# Patient Record
Sex: Female | Born: 1950
Health system: Southern US, Community
[De-identification: ages and names within clinical notes are randomized; demographics above are authoritative.]

## PROBLEM LIST (undated history)

## (undated) DIAGNOSIS — M1A9XX0 Chronic gout, unspecified, without tophus (tophi): Secondary | ICD-10-CM

## (undated) DIAGNOSIS — M1A00X0 Idiopathic chronic gout, unspecified site, without tophus (tophi): Secondary | ICD-10-CM

## (undated) DIAGNOSIS — I10 Essential (primary) hypertension: Secondary | ICD-10-CM

## (undated) DIAGNOSIS — E079 Disorder of thyroid, unspecified: Secondary | ICD-10-CM

## (undated) DIAGNOSIS — E119 Type 2 diabetes mellitus without complications: Secondary | ICD-10-CM

## (undated) DIAGNOSIS — E785 Hyperlipidemia, unspecified: Secondary | ICD-10-CM

## (undated) DIAGNOSIS — C3490 Malignant neoplasm of unspecified part of unspecified bronchus or lung: Secondary | ICD-10-CM

## (undated) HISTORY — DX: Malignant neoplasm of unspecified part of unspecified bronchus or lung: C34.90

## (undated) HISTORY — DX: Chronic gout, unspecified, without tophus (tophi): M1A.9XX0

## (undated) HISTORY — DX: Disorder of thyroid, unspecified: E07.9

## (undated) HISTORY — DX: Essential (primary) hypertension: I10

## (undated) HISTORY — DX: Idiopathic chronic gout, unspecified site, without tophus (tophi): M1A.00X0

## (undated) HISTORY — PX: THYROID SURGERY: SHX805

## (undated) HISTORY — DX: Hyperlipidemia, unspecified: E78.5

## (undated) HISTORY — DX: Type 2 diabetes mellitus without complications: E11.9

---

## 2001-02-20 ENCOUNTER — Emergency Department (HOSPITAL_COMMUNITY): Admission: EM | Admit: 2001-02-20 | Discharge: 2001-02-21 | Payer: Self-pay | Admitting: *Deleted

## 2005-08-27 ENCOUNTER — Ambulatory Visit: Admission: RE | Admit: 2005-08-27 | Discharge: 2005-08-27 | Payer: Self-pay | Admitting: Cardiothoracic Surgery

## 2005-09-06 DIAGNOSIS — C3490 Malignant neoplasm of unspecified part of unspecified bronchus or lung: Secondary | ICD-10-CM

## 2005-09-06 HISTORY — DX: Malignant neoplasm of unspecified part of unspecified bronchus or lung: C34.90

## 2005-09-06 HISTORY — PX: OTHER SURGICAL HISTORY: SHX169

## 2005-09-14 ENCOUNTER — Inpatient Hospital Stay (HOSPITAL_COMMUNITY): Admission: RE | Admit: 2005-09-14 | Discharge: 2005-09-20 | Payer: Self-pay | Admitting: Cardiothoracic Surgery

## 2005-09-14 ENCOUNTER — Ambulatory Visit: Payer: Self-pay | Admitting: Cardiology

## 2005-09-14 ENCOUNTER — Encounter (INDEPENDENT_AMBULATORY_CARE_PROVIDER_SITE_OTHER): Payer: Self-pay | Admitting: Specialist

## 2005-09-15 ENCOUNTER — Encounter: Payer: Self-pay | Admitting: Cardiology

## 2005-10-08 ENCOUNTER — Encounter: Admission: RE | Admit: 2005-10-08 | Discharge: 2005-10-08 | Payer: Self-pay | Admitting: Cardiothoracic Surgery

## 2006-01-28 ENCOUNTER — Encounter: Admission: RE | Admit: 2006-01-28 | Discharge: 2006-01-28 | Payer: Self-pay | Admitting: Cardiothoracic Surgery

## 2006-08-05 ENCOUNTER — Encounter: Admission: RE | Admit: 2006-08-05 | Discharge: 2006-08-05 | Payer: Self-pay | Admitting: Cardiothoracic Surgery

## 2006-10-26 ENCOUNTER — Ambulatory Visit (HOSPITAL_COMMUNITY): Admission: RE | Admit: 2006-10-26 | Discharge: 2006-10-26 | Payer: Self-pay | Admitting: *Deleted

## 2006-11-28 ENCOUNTER — Ambulatory Visit (HOSPITAL_COMMUNITY): Admission: RE | Admit: 2006-11-28 | Discharge: 2006-11-28 | Payer: Self-pay | Admitting: *Deleted

## 2007-03-16 ENCOUNTER — Ambulatory Visit: Payer: Self-pay | Admitting: Cardiothoracic Surgery

## 2007-07-21 ENCOUNTER — Ambulatory Visit: Payer: Self-pay | Admitting: Cardiothoracic Surgery

## 2007-07-21 ENCOUNTER — Encounter: Admission: RE | Admit: 2007-07-21 | Discharge: 2007-07-21 | Payer: Self-pay | Admitting: Cardiothoracic Surgery

## 2007-11-30 ENCOUNTER — Ambulatory Visit (HOSPITAL_COMMUNITY): Admission: RE | Admit: 2007-11-30 | Discharge: 2007-11-30 | Payer: Self-pay | Admitting: *Deleted

## 2008-01-19 ENCOUNTER — Ambulatory Visit: Payer: Self-pay | Admitting: Cardiothoracic Surgery

## 2008-01-19 ENCOUNTER — Encounter: Admission: RE | Admit: 2008-01-19 | Discharge: 2008-01-19 | Payer: Self-pay | Admitting: Cardiothoracic Surgery

## 2008-08-09 ENCOUNTER — Encounter: Admission: RE | Admit: 2008-08-09 | Discharge: 2008-08-09 | Payer: Self-pay | Admitting: Cardiothoracic Surgery

## 2008-08-13 ENCOUNTER — Ambulatory Visit: Payer: Self-pay | Admitting: Cardiothoracic Surgery

## 2008-12-02 ENCOUNTER — Ambulatory Visit (HOSPITAL_COMMUNITY): Admission: RE | Admit: 2008-12-02 | Discharge: 2008-12-02 | Payer: Self-pay | Admitting: Family Medicine

## 2009-01-24 ENCOUNTER — Ambulatory Visit: Payer: Self-pay | Admitting: Cardiothoracic Surgery

## 2009-01-24 ENCOUNTER — Encounter: Admission: RE | Admit: 2009-01-24 | Discharge: 2009-01-24 | Payer: Self-pay | Admitting: Cardiothoracic Surgery

## 2009-10-08 ENCOUNTER — Encounter: Admission: RE | Admit: 2009-10-08 | Discharge: 2009-10-08 | Payer: Self-pay | Admitting: Cardiothoracic Surgery

## 2009-10-08 ENCOUNTER — Ambulatory Visit: Payer: Self-pay | Admitting: Cardiothoracic Surgery

## 2009-12-16 ENCOUNTER — Ambulatory Visit (HOSPITAL_COMMUNITY): Admission: RE | Admit: 2009-12-16 | Discharge: 2009-12-16 | Payer: Self-pay | Admitting: Family Medicine

## 2010-09-26 ENCOUNTER — Other Ambulatory Visit: Payer: Self-pay | Admitting: Cardiothoracic Surgery

## 2010-09-26 DIAGNOSIS — R911 Solitary pulmonary nodule: Secondary | ICD-10-CM

## 2010-09-27 ENCOUNTER — Encounter: Payer: Self-pay | Admitting: Cardiothoracic Surgery

## 2010-10-14 ENCOUNTER — Other Ambulatory Visit: Payer: Self-pay

## 2010-10-14 ENCOUNTER — Ambulatory Visit: Payer: Self-pay | Admitting: Cardiothoracic Surgery

## 2010-10-21 ENCOUNTER — Ambulatory Visit (INDEPENDENT_AMBULATORY_CARE_PROVIDER_SITE_OTHER): Payer: Managed Care, Other (non HMO) | Admitting: Cardiothoracic Surgery

## 2010-10-21 ENCOUNTER — Ambulatory Visit
Admission: RE | Admit: 2010-10-21 | Discharge: 2010-10-21 | Disposition: A | Discharge status: Home / Self Care | Payer: Managed Care, Other (non HMO) | Source: Ambulatory Visit | Attending: Cardiothoracic Surgery | Admitting: Cardiothoracic Surgery

## 2010-10-21 DIAGNOSIS — C3430 Malignant neoplasm of lower lobe, unspecified bronchus or lung: Secondary | ICD-10-CM

## 2010-10-21 DIAGNOSIS — R911 Solitary pulmonary nodule: Secondary | ICD-10-CM

## 2010-10-21 MED ORDER — IOHEXOL 300 MG/ML  SOLN
75.0000 mL | Freq: Once | INTRAMUSCULAR | Status: AC | PRN
  Administered 2010-10-21: 75 mL via INTRAVENOUS

## 2010-12-14 NOTE — Assessment & Plan Note (Signed)
OFFICE VISIT  Christy Ellis, Christy Ellis DOB:  Jul 17, 1951                                        October 22, 2010 CHART #:  [id number]  CURRENT PROBLEMS: 1. Status post right lower lobectomy for stage I non-small-cell     carcinoma of the lung in January 2007. 2. Hypertension.  PRESENT ILLNESS:  The patient returns for annual followup with CT scan of the chest for surveillance after undergoing lobectomy for lung cancer 5 years ago.  She is a nonsmoker.  She has no pulmonary symptoms.  Her CT scan today shows no evidence of recurrence and no evidence of abnormal adenopathy.  She appears to have reached a surgical cure.  MEDICATIONS:  Unchanged for her diabetes and hypertension including Lotensin, aspirin, Synthroid, Lipitor, metformin, Cardizem, clonidine, and fish oil.  PHYSICAL EXAMINATION:  Vital Signs:  Blood pressure 120/80, pulse 70, respirations 18, saturation 97%.  General:  She is alert and pleasant. HEENT:  Normocephalic.  Neck:  Without adenopathy.  Lungs:  Breath sounds are clear and equal.  Chest:  She has a well-healed right thoracotomy incision.  Cardiac:  Rhythm is regular without gallop. Neurologic:  Intact.  IMPRESSION AND PLAN:  The results of the CT scan were reviewed with the patient.  At this point, she does not need an annual CT scan, but she should report any new pulmonary symptoms with her primary physician due to a 3% to 5% risk of delayed recurrence.  Kerin Perna, M.D. Electronically Signed  PV/MEDQ  D:  10/22/2010  T:  10/22/2010  Job:  [id number]  cc:   Donzetta Sprung

## 2011-01-01 ENCOUNTER — Other Ambulatory Visit (HOSPITAL_COMMUNITY): Payer: Self-pay | Admitting: *Deleted

## 2011-01-01 DIAGNOSIS — Z139 Encounter for screening, unspecified: Secondary | ICD-10-CM

## 2011-01-05 ENCOUNTER — Ambulatory Visit (HOSPITAL_COMMUNITY)
Admission: RE | Admit: 2011-01-05 | Discharge: 2011-01-05 | Disposition: A | Discharge status: Home / Self Care | Payer: Managed Care, Other (non HMO) | Source: Ambulatory Visit | Attending: Family Medicine | Admitting: Family Medicine

## 2011-01-05 ENCOUNTER — Other Ambulatory Visit (HOSPITAL_COMMUNITY): Payer: Self-pay | Admitting: Family Medicine

## 2011-01-05 DIAGNOSIS — Z139 Encounter for screening, unspecified: Secondary | ICD-10-CM

## 2011-01-05 DIAGNOSIS — Z1231 Encounter for screening mammogram for malignant neoplasm of breast: Secondary | ICD-10-CM | POA: Insufficient documentation

## 2011-01-19 NOTE — Assessment & Plan Note (Signed)
OFFICE VISIT   Christy Ellis, Christy Ellis  DOB:  01-Apr-1951                                        August 13, 2008  CHART #:  [id number]   CURRENT PROBLEMS:  1. Status post right lower lobectomy with lymph node dissection in      January 2007 for stage I non-small cell carcinoma of the lung.  2. Hypertension.  3. Hyperlipidemia.   PRESENT ILLNESS:  The patient is a 60 year old African American female  nonsmoker who had a right lower lobectomy for a stage I bronchogenic  carcinoma.  She has been followed with serial 1-year CT scans for  surveillance of recurrence.  She has had no pulmonary issues other than  a recent nonproductive cough.  Her weight has been stable and she denies  fever.  She has had no post thoracotomy pain and she remains on her  other usual medications for a blood pressure and lipid control including  Cardizem, Lotensin, clonidine, metformin, Lipitor, Synthroid, and  aspirin.   PHYSICAL EXAMINATION:  VITAL SIGNS:  Blood pressure 120/60, pulse 94,  respirations 16, saturation 97%, and weight 195 pounds.  GENERAL:  She is alert and pleasant.  LUNGS:  Breath sounds are clear.  She has no palpable neck or thoracic  adenopathy.  CARDIAC:  Rhythm is regular.  NEUROLOGIC:  Intact.  She has had some intermittent tenderness of the  lateral aspect of the dorsal surface of the right foot.  This is not  swollen or deformed today.   A CT scan performed recently now almost 3 years postop shows no evidence  of local recurrence in the right lung.  There are no right hilar lymph  nodes.  There are slightly more prominent subcarinal lymph nodes, which  she has had over the years which have increased in size from 1.2-1.6 cm.  These however, looked more inflammatory and without any adenopathy in  the hilum.  My index of suspicion for recurrent tumor at this lymph node  station would be low.   PLAN:  The patient returned for followup CT scan in 1 year.  I  would not  recommend the PET scan at this point due to the clean right hilum.   Kerin Perna, M.D.  Electronically Signed   PV/MEDQ  D:  08/13/2008  T:  08/13/2008  Job:  [id number]   cc:   Donzetta Sprung

## 2011-01-19 NOTE — Assessment & Plan Note (Signed)
OFFICE VISIT   KARLA, PAVONE  DOB:  01/14/1951                                        Jan 20, 2008  CHART #:  [id number]   CURRENT PROBLEMS:  1. Status post right lower lobectomy and lymph node dissection for      stage I non-small-cell carcinoma.  2. Hypertension.  3. Hyperlipidemia.   PRESENT ILLNESS:  Ms. Christy Ellis is a 60 year old female who is now 2 years  postoperative right lower lobectomy for a stage I cancer.  She is a  nonsmoker.  She has no pulmonary symptoms.  Her weight has been  increasing.  She has had no new medical problems since her last follow-  up exam 1 year ago and she remains on Cardizem, Lotensin, aspirin,  Synthroid and Lipitor.   EXAMINATION:  Blood pressure 114/70, pulse 88, respirations 18,  saturation room air 98%, weight 196 pounds which is increased.  She is  alert and pleasant.  Her breath sounds are clear and equal.  There is no  cervical or supraclavicular adenopathy.  Cardiac rhythm is regular and  her neurologic exam is intact.   A CT scan of the chest shows no evidence of recurrence or new  adenopathy.  She remains clean of recurrent disease now just over 2  years postoperatively.   PLAN:  The patient will return for a follow-up CT scan of the chest in 1  year, which we will continue for a total of 5 years following resection.   Kerin Perna, M.D.  Electronically Signed   PV/MEDQ  D:  01/20/2008  T:  01/20/2008  Job:  [id number]   cc:   Donzetta Sprung

## 2011-01-19 NOTE — Assessment & Plan Note (Signed)
OFFICE VISIT   Christy Ellis, Christy Ellis  DOB:  [birth year]                                        Jan 24, 2009  CHART #:  [id number]   CURRENT PROBLEMS:  1. Status post right lower lobectomy with lymph node dissection for      stage I non-small-cell carcinoma of the lung, January 2007.  2. Hypertension.  3. Hyperlipidemia.   PRESENT ILLNESS:  The patient is a 60 year old African American female  who underwent a right lower lobectomy with lymph node dissection in  January 2007 for stage I non-small-cell carcinoma of the lung.  She had  no postoperative therapy due to the early stage.  She has been followed  with annual CT scans of the chest.  Her last CT scan in December showed  a clear right lung; however some mediastinal lymph nodes were slightly  more prominent in the precarinal and subcarinal region.  She returns now  with a followup CT scan to compare this adenopathy to review any  evidence of progression.  The right hilum was clean on the previous CT  scan.   The patient denies any pulmonary symptoms.  She denies weight loss and  in fact has been gaining weight.  She has had some chronic sinus allergy  problems with a dry cough.   PHYSICAL EXAMINATION:  Blood pressure 120/70, pulse 65, respirations 18,  saturation 95% on room air.  She weighs 197 pounds.  She has no palpable  adenopathy.  Breath sounds are clear.  Cardiac rhythm is regular without  murmur.  Neurologic exam is intact.   LABORATORY DATA:  I reviewed the CT scan for which a final report is  pending.  However, the precarinal and subcarinal mild adenopathy is less  prominent at this time.  The right lung remains clean, and there is no  sign of recurrent cancer.   PLAN:  I will plan on seeing her back in February 2001 to get her back  on an annual schedule for CT followup until late 2012.  Fortunately, she  continues to be a nonsmoker.   Kerin Perna, M.D.  Electronically Signed   PV/MEDQ  D:  01/24/2009  T:  01/25/2009  Job:  [id number]   cc:   Donzetta Sprung

## 2011-01-19 NOTE — Assessment & Plan Note (Signed)
OFFICE VISIT   Christy Ellis, Christy Ellis  DOB:  Feb 12, 1951                                        July 21, 2007  CHART #:  [id number]   CURRENT PROBLEMS:  1. Status post right lower lobectomy and a lymph nodes dissection for      a non-small cell carcinoma of the right lower lobe, stage I.  2. Hypertension.  3. Hyperlipidemia.  4. History of smoking now resolved.   PRESENT ILLNESS:  Christy Ellis is a 60 year old female who returns for  follow up with an annual CT scan after being surgically treated for  stage I non-small cell carcinoma of the right lower lobe. She is not  smoking. She denies any pulmonary problems, chest pain, or weight loss.  She remains on her medications including Lipitor, Synthroid, aspirin,  Lotensin, HCT and Cardizem CD.   PHYSICAL EXAMINATION:  Blood pressure 160/90, pulse 90, respirations 18,  saturations 92%. She is alert and oriented. Breath sounds are clear and  equal. She has a well healed right thoracotomy incision. There is no  cervical or supraclavicular adenopathy. Neurologic exam is intact.   Her CT scan of the chest shows clear lung fields and no evidence of  recurrence, no mediastinal adenopathy.   IMPRESSION/PLAN:  The patient is now stable 2 years after lobectomy with  serial CT scans showing no evidence of recurrence. She will continue to  be followed with annual exams and a CT scan until 5 years has passed  following resection. She appears to be doing fairly well.   Kerin Perna, M.D.  Electronically Signed   PV/MEDQ  D:  07/21/2007  T:  07/22/2007  Job:  [id number]   cc:   Donzetta Sprung

## 2011-01-19 NOTE — Assessment & Plan Note (Signed)
OFFICE VISIT   Christy Ellis, Christy Ellis  DOB:  01-28-1951                                        October 08, 2009  CHART #:  [id number]   CURRENT PROBLEMS:  1. Status post right lower lobectomy with lymph node dissection for      stage I non-small cell carcinoma of the lung, January 2007.  2. Hypertension.  3. Hyperlipidemia.   PRESENT ILLNESS:  The patient returns for her annual surveillance CT  scan of the chest, now 4 years after right lower lobectomy for a stage I  non-small cell carcinoma.  She had no postoperative adjunctive  chemotherapy due to the early stage of the primary.  Her serial scans  today have been negative for evidence of recurrence or new disease.  She  has had some mild borderline adenopathy of the mediastinum which has  been stable now for 18 months.  She denies any pulmonary symptoms.  She  is not smoking.  She has had no pain or weight loss.   PHYSICAL EXAMINATION:  Her weight is 198 pounds.  Blood pressure 110/70,  pulse 80, respirations 18, and saturation 96% on room air.  She is alert  and pleasant.  Breath sounds are clear and equal.  She has no palpable  adenopathy of the neck.  Cardiac rhythm is regular.  There is no edema.  Neurologic exam is intact.   LABORATORY DATA:  Her CT scan of the chest is reviewed and shows no  evidence of recurrence.  She has mild stable mediastinal and subcarinal  adenopathy, which is unchanged.  There is no pleural effusion or new  nodules.   PLAN:  I will see her for one final CT scan at 5 years post resection.   Kerin Perna, M.D.  Electronically Signed   PV/MEDQ  D:  10/08/2009  T:  10/08/2009  Job:  [id number]   cc:   Donzetta Sprung

## 2011-01-22 NOTE — Op Note (Signed)
Christy Ellis, Christy Ellis               ACCOUNT NO.:  [id number]   MEDICAL RECORD NO.:  [id number]          PATIENT TYPE:  INP   LOCATION:  2308                         FACILITY:  [facility]   PHYSICIAN:  Kerin Perna, M.D.  DATE OF BIRTH:  1951-01-19   DATE OF PROCEDURE:  09/14/2005  DATE OF DISCHARGE:                                 OPERATIVE REPORT   OPERATION:  Right thoracotomy, right lower lobectomy with placement of On-Q  wound catheter system.   SURGEON:  Kerin Perna, M.D.   ASSISTANTS:  1.  Sheliah Plane, M.D.  2.  Gershon Crane P.A.-C.   PREOPERATIVE DIAGNOSIS:  A 3 cm mass right lower lobe, non-small cell  carcinoma one by frozen section.   POSTOPERATIVE DIAGNOSIS:  A 3 cm mass right lower lobe, non-small cell  carcinoma one by frozen section.   INDICATIONS:  The patient is a 60 year old female smoker who presented with  a right lower lobe mass on x-ray.  She was treated for upper respiratory  infection this fall and when the infection cleared her chest x-ray showed a  persistent density.  She was evaluated with a PET scan and a head CT scan  and the lung mass was consistent with a primary lung cancer without  mediastinal involvement or distant metastases.  She was recommended for  surgical resection.  Prior to surgery, I examined the patient in the office  and reviewed the results of the scans and x-rays with the patient.  I  discussed indications and expected benefits of thoracotomy and lobectomy for  treatment of her presumed stage I non-small cell carcinoma right lung.  I  discussed the alternatives to surgical therapy as well.  I reviewed with the  patient major aspects of the planned procedure including use of general  anesthesia, location of the surgical incision, use of postoperative chest  tube drainage system, and expected hospital recovery.  I reviewed with her  the risks to her of right thoracotomy and lobectomy including risks of  bleeding, persistent air  leak, pneumonia, and death.  After discussion and  consultation, she indicated her understanding and agreed to proceed with  operation as planned under what I felt was an informed consent after meeting  with her on two separate occasions in the office.   OPERATIVE FINDINGS:  The patient's pleural space was completely obliterated  with adhesions.  For that reason the VATS system was not used and a open  thoracotomy technique was performed for the lobectomy.  The frozen section  on the mass was consistent with adenocarcinoma of the lung.  Lymph nodes  around the bronchus were also submitted for pathologic analysis.  The wound  On-Q system was placed in the chest wall after closure of the thoracotomy.   DESCRIPTION OF PROCEDURE:  The patient was brought to operating room and  placed supine on the operating room table where general anesthesia was  induced.  A double-lumen endotracheal tube was positioned by the  anesthesiologist.  The patient was turned to expose the right chest which  was prepped and draped as a sterile  field.  A right lateral thoracotomy was  then made, and the chest was entered and the fifth interspace.  The pleural  space was completely obliterated with adhesions and the fifth rib was  excised to help with exposure and to allow for some space to begin the  dissection of taking down all the adhesions which were tedious and bloody.  Finally, the lower lobe was mobilized and the mass was palpable in the  posterior basilar segment.  The fissure was opened and the pulmonary artery  vessels were exposed. The vessels to the superior segmental of the lower  lobe were dissected out, doubly tied with silk ligatures and divided.  The  basilar trunk to the lower lobe was then dissected out, encircled with a  vessel loop.  The Endo-GIA vascular stapler was then used to staple and  divide the basilar trunk to the lower lobe.  Next, the inferior vein was  dissected and encircled with  vessel loop.  This was again stabled with the  Endo-GIA stapler using the vascular staple load.  This completely divided  the inferior vein.  It  was completely hemostatic.  Next, the remaining soft  tissue around the bronchus were then taken down and clipped.  The TA-30  stapler was then fired across the bronchus at the origin of the lower lobe.  The lungs were bagged after the stapler was clamped and there was good  inflation of the middle and upper lobes.  The stapler was then fired and the  specimen was divided at the bronchus and removed and sent for pathologic  analysis.  The stapler was taken off the bronchial stump which was intact  and the staple line was in good alignment. Warm saline was used to irrigate  the chest, and there were some scattered areas of air leak from the lung  parenchyma which were treated with the FloSeal adhesive topical therapy.  Hemostasis was adequate. The middle lobe was attached to the upper lobe with  two chromic sutures.  Two pleural chest tube were placed anteriorly and  posteriorly and brought out through separate incisions.  The lungs were  reinflated under direct vision and appeared to fill the hemithorax well.  The chest wall was closed with interrupted #2 Vicryl for the pericostals.  The muscle layers were closed in two layers using interrupted #1 Vicryl.  The subcutaneous layer was closed in running 2-0 Vicryl, and skin was closed  in a subcuticular.  The On-Q catheter was then placed in the thoracotomy  wound in the chest wall and secured to the skin and connected to the  Marcaine reservoir.  The patient was extubated returned to the recovery room  in stable condition.      Kerin Perna, M.D.  Electronically Signed     PV/MEDQ  D:  09/14/2005  T:  09/15/2005  Job:  [id number]   cc:   [facility] Office   Lajuana Matte, MD  Fax: [phone]

## 2011-01-22 NOTE — Discharge Summary (Signed)
NAMESCHELLY, CHUBA               ACCOUNT NO.:  [id number]   MEDICAL RECORD NO.:  [id number]          PATIENT TYPE:  INP   LOCATION:  3305                         FACILITY:  [facility]   PHYSICIAN:  Kerin Perna, M.D.  DATE OF BIRTH:  25-Apr-1951   DATE OF ADMISSION:  09/14/2005  DATE OF DISCHARGE:                                 DISCHARGE SUMMARY   PRIMARY DISCHARGE DIAGNOSIS:  Right lower lobe lung mass, positive for  poorly differentiated invasive adenocarcinoma, T1, N0, MX.   IN HOSPITAL DIAGNOSIS:  Acute blood loss anemia.   SECONDARY DIAGNOSES:  1.  Hypertension.  2.  Hyperlipidemia.  3.  Diabetes mellitus.  4.  Recent ex-smoker, 1-1/2 packs per day.  5.  Status post parietal thyroidectomy for goiter.  6.  Fibrocystic disease of the breasts, stable.  7.  History of heavy alcohol intake, none for the past two weeks.   ALLERGIES:  No known drug allergies.   IN HOSPITAL OPERATIONS/PROCEDURES:  1.  Right thoracotomy, right lower lobectomy with placement of an on cue      would catheter system.  2.  Echocardiogram.   HISTORY AND PHYSICAL/HOSPITAL COURSE:  The patient is a 60 year old female  who presented with a right lower lobe mass on x-ray.  She was treated for  upper respiratory infection _and_after resolution the chest CT scan________x-  rays still showed a persistent density.  She was evaluated with a PET scan  and a head CT scan and the lung mass was consistent with a primary lung  cancer without mediastinal involvement or distant metastasis.  She was  recommended for surgical resection.  The patient was seen and evaluated by  Dr. Donata Clay.  Dr. Donata Clay discussed with the patient undergoing right  lower lobectomy.  He discussed the risks and benefits of this procedure.  The patient acknowledged her understanding and wished to proceed.  She was  scheduled for January 9, 20-07.   The patient was taken to the operating room January9, 2007, where she  underwent right  thoracotomy, right lower lobectomy, blood placement on each  wound catheter system.  The patient tolerated this procedure well and  transferred up to the intensive care unit in stable condition.  The  patient's postoperative course was complicated by respiratory insufficiency.  The patient required BiPAP postoperative day #1.  This was able to be weaned  from postoperative day #1 and she was saturating greater than 90% on nasal  cannula.  The patient was placed on nebulizers and inhalers and encouraged  to use her incentive spirometer.  She was able to be weaned off oxygen  saturating greater than 90% on room air.  The patient's _anteroir_________  chest tube was discontinued on postoperative day #3.  The remaining chest  tube will be discontinued prior to discharge home and follow-up chest x-ray  will be obtained post discontinuation of remaining chest tube.  The patient  did require dopamine for renal function.  She had minimal urine output  immediately following surgery.  This increased with the renal dopamine and  fluid.  This was able  to be weaned off the patient on postoperative day #2.  The patient also developed acute blood loss anemia postoperatively.  This  was monitored during her hospital stay.  She remained asymptomatic.  It was  stable prior to discharge home.  The patient's incisions are dry and intact  and healing well postoperatively.  She is out of bed ambulating well.  A 2-D  echocardiogram was obtained on postoperative day #1 which showed the left  ventricular systolic function to be vigorous.  Her ejection fraction was 65-  75%.  There is no diagnostic evidence of left ventricular regional wall  motion abnormality.  Left ventricular wall thickness was mildly increased.  There was an increased relative contribution of atrial contraction to left  ventricular feeling consistent with grade 1 diastolic dysfunction.  The  patient was tolerating regular diet well.  No nausea or  vomiting noted.  The  patient has a history of diabetes mellitus and her CBGs were slightly  elevated postoperatively.  She was continued on a sliding scale  postoperatively and CBGs were closely monitored.  They were back near  baseline prior to discharge home.  The patient's pathology report came back  positive for poorly differentiated invasive adenocarcinoma, T1, N0, MX.   The patient is tentatively ready for discharge home postoperative day #5,  September 19, 2005.  A follow-up appointment has been scheduled with Dr. Donata Clay for October 08, 2005, at 11:45 a.m.  The patient is to obtain a PMI or  chest x-ray one hour prior to this appointment.  Ms. Roszak received  instructions on diet, activity level, and incisional care.  She was told no  driving until released to do so, no heavy lifting over 10 pounds.  The  patient was told she was allowed to shower, washing her incisions using soap  and water.  She is to contact the office if she develops any drainage or  opening from any of her incision sites.  The patient was told to ambulate 3-  4 times per day, progress as tolerated.  She was told to continue her  breathing________exercises.  The was educated on diet to be low fat, low  salt.  She acknowledged her understanding.   DISCHARGE MEDICATIONS:  1.  Diltiazem 240 mg daily.  2.  Benazepril/HCTZ 20/25 daily.  3.  Synthroid 125 mcg daily.  4.  Lipitor 20 mg daily.  5.  Aspirin 325 mg daily.  6.  Lopressor 25 mg b.i.d.  7.  Guaifenesin 600 mg b.i.d.  8.  Advair 250/50 one puff b.i.d.  9.  Oxycodone 5 mg 150 tabs p.o. q.4-6 h. p.r.n. pain.      Theda [place], [place]      Kerin Perna, M.D.  Electronically Signed    KMD/MEDQ  D:  09/17/2005  T:  09/18/2005  Job:  [id number]   cc:   Kerin Perna, M.D.  [address]  [place]  [place] [address]

## 2011-12-14 ENCOUNTER — Other Ambulatory Visit (HOSPITAL_COMMUNITY): Payer: Self-pay | Admitting: Family Medicine

## 2011-12-14 DIAGNOSIS — Z139 Encounter for screening, unspecified: Secondary | ICD-10-CM

## 2012-01-07 ENCOUNTER — Ambulatory Visit (HOSPITAL_COMMUNITY)
Admission: RE | Admit: 2012-01-07 | Discharge: 2012-01-07 | Disposition: A | Discharge status: Home / Self Care | Payer: Managed Care, Other (non HMO) | Source: Ambulatory Visit | Attending: Family Medicine | Admitting: Family Medicine

## 2012-01-07 DIAGNOSIS — Z139 Encounter for screening, unspecified: Secondary | ICD-10-CM

## 2012-01-07 DIAGNOSIS — Z1231 Encounter for screening mammogram for malignant neoplasm of breast: Secondary | ICD-10-CM | POA: Insufficient documentation

## 2013-01-22 ENCOUNTER — Other Ambulatory Visit (HOSPITAL_COMMUNITY): Payer: Self-pay | Admitting: Family Medicine

## 2013-01-22 DIAGNOSIS — Z139 Encounter for screening, unspecified: Secondary | ICD-10-CM

## 2013-01-26 ENCOUNTER — Ambulatory Visit (HOSPITAL_COMMUNITY)
Admission: RE | Admit: 2013-01-26 | Discharge: 2013-01-26 | Disposition: A | Discharge status: Home / Self Care | Payer: Managed Care, Other (non HMO) | Source: Ambulatory Visit | Attending: Family Medicine | Admitting: Family Medicine

## 2013-01-26 DIAGNOSIS — Z139 Encounter for screening, unspecified: Secondary | ICD-10-CM

## 2013-01-26 DIAGNOSIS — Z1231 Encounter for screening mammogram for malignant neoplasm of breast: Secondary | ICD-10-CM | POA: Insufficient documentation

## 2013-12-25 ENCOUNTER — Other Ambulatory Visit (HOSPITAL_COMMUNITY): Payer: Self-pay | Admitting: Family Medicine

## 2013-12-25 DIAGNOSIS — Z1231 Encounter for screening mammogram for malignant neoplasm of breast: Secondary | ICD-10-CM

## 2014-02-04 ENCOUNTER — Ambulatory Visit (HOSPITAL_COMMUNITY)
Admission: RE | Admit: 2014-02-04 | Discharge: 2014-02-04 | Disposition: A | Discharge status: Home / Self Care | Payer: Managed Care, Other (non HMO) | Source: Ambulatory Visit | Attending: Family Medicine | Admitting: Family Medicine

## 2014-02-04 DIAGNOSIS — Z1231 Encounter for screening mammogram for malignant neoplasm of breast: Secondary | ICD-10-CM

## 2015-01-08 ENCOUNTER — Other Ambulatory Visit (HOSPITAL_COMMUNITY): Payer: Self-pay | Admitting: Family Medicine

## 2015-01-08 DIAGNOSIS — Z1231 Encounter for screening mammogram for malignant neoplasm of breast: Secondary | ICD-10-CM

## 2015-02-10 ENCOUNTER — Ambulatory Visit (HOSPITAL_COMMUNITY)
Admission: RE | Admit: 2015-02-10 | Discharge: 2015-02-10 | Disposition: A | Discharge status: Home / Self Care | Payer: Managed Care, Other (non HMO) | Source: Ambulatory Visit | Attending: Family Medicine | Admitting: Family Medicine

## 2015-02-10 DIAGNOSIS — Z1231 Encounter for screening mammogram for malignant neoplasm of breast: Secondary | ICD-10-CM | POA: Insufficient documentation

## 2015-08-04 DIAGNOSIS — E1165 Type 2 diabetes mellitus with hyperglycemia: Secondary | ICD-10-CM | POA: Diagnosis not present

## 2015-08-07 DIAGNOSIS — Z23 Encounter for immunization: Secondary | ICD-10-CM | POA: Diagnosis not present

## 2015-08-07 DIAGNOSIS — J301 Allergic rhinitis due to pollen: Secondary | ICD-10-CM | POA: Diagnosis not present

## 2015-08-07 DIAGNOSIS — E1165 Type 2 diabetes mellitus with hyperglycemia: Secondary | ICD-10-CM | POA: Diagnosis not present

## 2015-08-07 DIAGNOSIS — E6609 Other obesity due to excess calories: Secondary | ICD-10-CM | POA: Diagnosis not present

## 2015-08-07 DIAGNOSIS — I10 Essential (primary) hypertension: Secondary | ICD-10-CM | POA: Diagnosis not present

## 2015-08-07 DIAGNOSIS — Z0001 Encounter for general adult medical examination with abnormal findings: Secondary | ICD-10-CM | POA: Diagnosis not present

## 2015-08-07 DIAGNOSIS — E782 Mixed hyperlipidemia: Secondary | ICD-10-CM | POA: Diagnosis not present

## 2015-10-06 DIAGNOSIS — E11319 Type 2 diabetes mellitus with unspecified diabetic retinopathy without macular edema: Secondary | ICD-10-CM | POA: Diagnosis not present

## 2015-11-26 DIAGNOSIS — H2511 Age-related nuclear cataract, right eye: Secondary | ICD-10-CM | POA: Diagnosis not present

## 2015-11-26 DIAGNOSIS — H2512 Age-related nuclear cataract, left eye: Secondary | ICD-10-CM | POA: Diagnosis not present

## 2015-11-26 DIAGNOSIS — H35031 Hypertensive retinopathy, right eye: Secondary | ICD-10-CM | POA: Diagnosis not present

## 2015-11-26 DIAGNOSIS — H25012 Cortical age-related cataract, left eye: Secondary | ICD-10-CM | POA: Diagnosis not present

## 2015-11-26 DIAGNOSIS — H35032 Hypertensive retinopathy, left eye: Secondary | ICD-10-CM | POA: Diagnosis not present

## 2015-11-26 DIAGNOSIS — H25011 Cortical age-related cataract, right eye: Secondary | ICD-10-CM | POA: Diagnosis not present

## 2015-12-01 DIAGNOSIS — J40 Bronchitis, not specified as acute or chronic: Secondary | ICD-10-CM | POA: Diagnosis not present

## 2015-12-15 DIAGNOSIS — E1165 Type 2 diabetes mellitus with hyperglycemia: Secondary | ICD-10-CM | POA: Diagnosis not present

## 2015-12-15 DIAGNOSIS — J301 Allergic rhinitis due to pollen: Secondary | ICD-10-CM | POA: Diagnosis not present

## 2015-12-15 DIAGNOSIS — E039 Hypothyroidism, unspecified: Secondary | ICD-10-CM | POA: Diagnosis not present

## 2015-12-15 DIAGNOSIS — E782 Mixed hyperlipidemia: Secondary | ICD-10-CM | POA: Diagnosis not present

## 2015-12-15 DIAGNOSIS — E6609 Other obesity due to excess calories: Secondary | ICD-10-CM | POA: Diagnosis not present

## 2015-12-15 DIAGNOSIS — I10 Essential (primary) hypertension: Secondary | ICD-10-CM | POA: Diagnosis not present

## 2015-12-23 DIAGNOSIS — H2511 Age-related nuclear cataract, right eye: Secondary | ICD-10-CM | POA: Diagnosis not present

## 2016-01-23 DIAGNOSIS — H25012 Cortical age-related cataract, left eye: Secondary | ICD-10-CM | POA: Diagnosis not present

## 2016-01-23 DIAGNOSIS — H2512 Age-related nuclear cataract, left eye: Secondary | ICD-10-CM | POA: Diagnosis not present

## 2016-01-29 ENCOUNTER — Other Ambulatory Visit (HOSPITAL_COMMUNITY): Payer: Self-pay | Admitting: Family Medicine

## 2016-01-29 DIAGNOSIS — Z1231 Encounter for screening mammogram for malignant neoplasm of breast: Secondary | ICD-10-CM

## 2016-02-19 DIAGNOSIS — Z1211 Encounter for screening for malignant neoplasm of colon: Secondary | ICD-10-CM | POA: Diagnosis not present

## 2016-02-19 HISTORY — PX: COLONOSCOPY: SHX174

## 2016-03-01 ENCOUNTER — Ambulatory Visit (HOSPITAL_COMMUNITY)
Admission: RE | Admit: 2016-03-01 | Discharge: 2016-03-01 | Disposition: A | Discharge status: Home / Self Care | Payer: Managed Care, Other (non HMO) | Source: Ambulatory Visit | Attending: Family Medicine | Admitting: Family Medicine

## 2016-03-01 DIAGNOSIS — Z1231 Encounter for screening mammogram for malignant neoplasm of breast: Secondary | ICD-10-CM | POA: Diagnosis not present

## 2016-03-23 DIAGNOSIS — H25812 Combined forms of age-related cataract, left eye: Secondary | ICD-10-CM | POA: Diagnosis not present

## 2016-03-23 DIAGNOSIS — H2512 Age-related nuclear cataract, left eye: Secondary | ICD-10-CM | POA: Diagnosis not present

## 2016-04-15 DIAGNOSIS — E039 Hypothyroidism, unspecified: Secondary | ICD-10-CM | POA: Diagnosis not present

## 2016-04-15 DIAGNOSIS — E782 Mixed hyperlipidemia: Secondary | ICD-10-CM | POA: Diagnosis not present

## 2016-04-15 DIAGNOSIS — E1165 Type 2 diabetes mellitus with hyperglycemia: Secondary | ICD-10-CM | POA: Diagnosis not present

## 2016-04-15 DIAGNOSIS — I10 Essential (primary) hypertension: Secondary | ICD-10-CM | POA: Diagnosis not present

## 2016-04-20 DIAGNOSIS — I10 Essential (primary) hypertension: Secondary | ICD-10-CM | POA: Diagnosis not present

## 2016-04-20 DIAGNOSIS — E1165 Type 2 diabetes mellitus with hyperglycemia: Secondary | ICD-10-CM | POA: Diagnosis not present

## 2016-04-20 DIAGNOSIS — E039 Hypothyroidism, unspecified: Secondary | ICD-10-CM | POA: Diagnosis not present

## 2016-04-20 DIAGNOSIS — E782 Mixed hyperlipidemia: Secondary | ICD-10-CM | POA: Diagnosis not present

## 2016-04-20 DIAGNOSIS — J301 Allergic rhinitis due to pollen: Secondary | ICD-10-CM | POA: Diagnosis not present

## 2016-04-20 DIAGNOSIS — Z6831 Body mass index (BMI) 31.0-31.9, adult: Secondary | ICD-10-CM | POA: Diagnosis not present

## 2016-04-20 DIAGNOSIS — E6609 Other obesity due to excess calories: Secondary | ICD-10-CM | POA: Diagnosis not present

## 2016-05-26 DIAGNOSIS — Z7984 Long term (current) use of oral hypoglycemic drugs: Secondary | ICD-10-CM | POA: Diagnosis not present

## 2016-05-26 DIAGNOSIS — E890 Postprocedural hypothyroidism: Secondary | ICD-10-CM | POA: Diagnosis not present

## 2016-05-26 DIAGNOSIS — E119 Type 2 diabetes mellitus without complications: Secondary | ICD-10-CM | POA: Diagnosis not present

## 2016-05-26 DIAGNOSIS — M109 Gout, unspecified: Secondary | ICD-10-CM | POA: Diagnosis not present

## 2016-05-26 DIAGNOSIS — M8580 Other specified disorders of bone density and structure, unspecified site: Secondary | ICD-10-CM | POA: Diagnosis not present

## 2016-05-26 DIAGNOSIS — Z79899 Other long term (current) drug therapy: Secondary | ICD-10-CM | POA: Diagnosis not present

## 2016-05-26 DIAGNOSIS — Z7982 Long term (current) use of aspirin: Secondary | ICD-10-CM | POA: Diagnosis not present

## 2016-05-26 DIAGNOSIS — I10 Essential (primary) hypertension: Secondary | ICD-10-CM | POA: Diagnosis not present

## 2016-05-26 DIAGNOSIS — Z780 Asymptomatic menopausal state: Secondary | ICD-10-CM | POA: Diagnosis not present

## 2016-08-24 DIAGNOSIS — I10 Essential (primary) hypertension: Secondary | ICD-10-CM | POA: Diagnosis not present

## 2016-08-24 DIAGNOSIS — J301 Allergic rhinitis due to pollen: Secondary | ICD-10-CM | POA: Diagnosis not present

## 2016-08-24 DIAGNOSIS — E6609 Other obesity due to excess calories: Secondary | ICD-10-CM | POA: Diagnosis not present

## 2016-08-24 DIAGNOSIS — Z0001 Encounter for general adult medical examination with abnormal findings: Secondary | ICD-10-CM | POA: Diagnosis not present

## 2016-08-24 DIAGNOSIS — Z23 Encounter for immunization: Secondary | ICD-10-CM | POA: Diagnosis not present

## 2016-08-24 DIAGNOSIS — E039 Hypothyroidism, unspecified: Secondary | ICD-10-CM | POA: Diagnosis not present

## 2016-08-24 DIAGNOSIS — E1165 Type 2 diabetes mellitus with hyperglycemia: Secondary | ICD-10-CM | POA: Diagnosis not present

## 2016-08-24 DIAGNOSIS — E782 Mixed hyperlipidemia: Secondary | ICD-10-CM | POA: Diagnosis not present

## 2016-12-15 DIAGNOSIS — E1165 Type 2 diabetes mellitus with hyperglycemia: Secondary | ICD-10-CM | POA: Diagnosis not present

## 2016-12-20 DIAGNOSIS — E1165 Type 2 diabetes mellitus with hyperglycemia: Secondary | ICD-10-CM | POA: Diagnosis not present

## 2016-12-20 DIAGNOSIS — E039 Hypothyroidism, unspecified: Secondary | ICD-10-CM | POA: Diagnosis not present

## 2016-12-20 DIAGNOSIS — Z6830 Body mass index (BMI) 30.0-30.9, adult: Secondary | ICD-10-CM | POA: Diagnosis not present

## 2016-12-20 DIAGNOSIS — J301 Allergic rhinitis due to pollen: Secondary | ICD-10-CM | POA: Diagnosis not present

## 2016-12-20 DIAGNOSIS — Z1212 Encounter for screening for malignant neoplasm of rectum: Secondary | ICD-10-CM | POA: Diagnosis not present

## 2016-12-20 DIAGNOSIS — E6609 Other obesity due to excess calories: Secondary | ICD-10-CM | POA: Diagnosis not present

## 2016-12-20 DIAGNOSIS — E782 Mixed hyperlipidemia: Secondary | ICD-10-CM | POA: Diagnosis not present

## 2016-12-20 DIAGNOSIS — I10 Essential (primary) hypertension: Secondary | ICD-10-CM | POA: Diagnosis not present

## 2017-01-06 DIAGNOSIS — H35362 Drusen (degenerative) of macula, left eye: Secondary | ICD-10-CM | POA: Diagnosis not present

## 2017-01-06 DIAGNOSIS — H524 Presbyopia: Secondary | ICD-10-CM | POA: Diagnosis not present

## 2017-01-19 ENCOUNTER — Other Ambulatory Visit (HOSPITAL_COMMUNITY): Payer: Self-pay | Admitting: Family Medicine

## 2017-01-19 DIAGNOSIS — Z1231 Encounter for screening mammogram for malignant neoplasm of breast: Secondary | ICD-10-CM

## 2017-03-03 ENCOUNTER — Ambulatory Visit (HOSPITAL_COMMUNITY): Payer: Medicare HMO

## 2017-05-06 DIAGNOSIS — Z6830 Body mass index (BMI) 30.0-30.9, adult: Secondary | ICD-10-CM | POA: Diagnosis not present

## 2017-05-06 DIAGNOSIS — R062 Wheezing: Secondary | ICD-10-CM | POA: Diagnosis not present

## 2017-05-06 DIAGNOSIS — J40 Bronchitis, not specified as acute or chronic: Secondary | ICD-10-CM | POA: Diagnosis not present

## 2017-07-19 DIAGNOSIS — L401 Generalized pustular psoriasis: Secondary | ICD-10-CM | POA: Diagnosis not present

## 2017-08-09 DIAGNOSIS — L401 Generalized pustular psoriasis: Secondary | ICD-10-CM | POA: Diagnosis not present

## 2017-08-11 DIAGNOSIS — E782 Mixed hyperlipidemia: Secondary | ICD-10-CM | POA: Diagnosis not present

## 2017-08-11 DIAGNOSIS — E1165 Type 2 diabetes mellitus with hyperglycemia: Secondary | ICD-10-CM | POA: Diagnosis not present

## 2017-08-11 DIAGNOSIS — E039 Hypothyroidism, unspecified: Secondary | ICD-10-CM | POA: Diagnosis not present

## 2017-08-11 DIAGNOSIS — I10 Essential (primary) hypertension: Secondary | ICD-10-CM | POA: Diagnosis not present

## 2017-08-15 DIAGNOSIS — S83422A Sprain of lateral collateral ligament of left knee, initial encounter: Secondary | ICD-10-CM | POA: Diagnosis not present

## 2017-08-16 DIAGNOSIS — S83422A Sprain of lateral collateral ligament of left knee, initial encounter: Secondary | ICD-10-CM | POA: Diagnosis not present

## 2017-08-24 DIAGNOSIS — E1165 Type 2 diabetes mellitus with hyperglycemia: Secondary | ICD-10-CM | POA: Diagnosis not present

## 2017-08-24 DIAGNOSIS — E782 Mixed hyperlipidemia: Secondary | ICD-10-CM | POA: Diagnosis not present

## 2017-08-24 DIAGNOSIS — Z1389 Encounter for screening for other disorder: Secondary | ICD-10-CM | POA: Diagnosis not present

## 2017-08-24 DIAGNOSIS — I10 Essential (primary) hypertension: Secondary | ICD-10-CM | POA: Diagnosis not present

## 2017-08-24 DIAGNOSIS — Z6828 Body mass index (BMI) 28.0-28.9, adult: Secondary | ICD-10-CM | POA: Diagnosis not present

## 2017-08-24 DIAGNOSIS — E039 Hypothyroidism, unspecified: Secondary | ICD-10-CM | POA: Diagnosis not present

## 2017-08-24 DIAGNOSIS — Z23 Encounter for immunization: Secondary | ICD-10-CM | POA: Diagnosis not present

## 2017-08-24 DIAGNOSIS — E6609 Other obesity due to excess calories: Secondary | ICD-10-CM | POA: Diagnosis not present

## 2017-09-27 DIAGNOSIS — Z7984 Long term (current) use of oral hypoglycemic drugs: Secondary | ICD-10-CM | POA: Diagnosis not present

## 2017-09-27 DIAGNOSIS — E039 Hypothyroidism, unspecified: Secondary | ICD-10-CM | POA: Diagnosis not present

## 2017-09-27 DIAGNOSIS — E785 Hyperlipidemia, unspecified: Secondary | ICD-10-CM | POA: Diagnosis not present

## 2017-09-27 DIAGNOSIS — E119 Type 2 diabetes mellitus without complications: Secondary | ICD-10-CM | POA: Diagnosis not present

## 2017-09-27 DIAGNOSIS — M109 Gout, unspecified: Secondary | ICD-10-CM | POA: Diagnosis not present

## 2017-09-27 DIAGNOSIS — Z87891 Personal history of nicotine dependence: Secondary | ICD-10-CM | POA: Diagnosis not present

## 2017-09-27 DIAGNOSIS — Z825 Family history of asthma and other chronic lower respiratory diseases: Secondary | ICD-10-CM | POA: Diagnosis not present

## 2017-09-27 DIAGNOSIS — I10 Essential (primary) hypertension: Secondary | ICD-10-CM | POA: Diagnosis not present

## 2017-12-16 DIAGNOSIS — I10 Essential (primary) hypertension: Secondary | ICD-10-CM | POA: Diagnosis not present

## 2017-12-16 DIAGNOSIS — C3491 Malignant neoplasm of unspecified part of right bronchus or lung: Secondary | ICD-10-CM | POA: Diagnosis not present

## 2017-12-16 DIAGNOSIS — E039 Hypothyroidism, unspecified: Secondary | ICD-10-CM | POA: Diagnosis not present

## 2017-12-16 DIAGNOSIS — Z9189 Other specified personal risk factors, not elsewhere classified: Secondary | ICD-10-CM | POA: Diagnosis not present

## 2017-12-16 DIAGNOSIS — E1165 Type 2 diabetes mellitus with hyperglycemia: Secondary | ICD-10-CM | POA: Diagnosis not present

## 2017-12-21 DIAGNOSIS — I10 Essential (primary) hypertension: Secondary | ICD-10-CM | POA: Diagnosis not present

## 2017-12-21 DIAGNOSIS — E039 Hypothyroidism, unspecified: Secondary | ICD-10-CM | POA: Diagnosis not present

## 2017-12-21 DIAGNOSIS — E782 Mixed hyperlipidemia: Secondary | ICD-10-CM | POA: Diagnosis not present

## 2017-12-21 DIAGNOSIS — E1165 Type 2 diabetes mellitus with hyperglycemia: Secondary | ICD-10-CM | POA: Diagnosis not present

## 2017-12-21 DIAGNOSIS — J301 Allergic rhinitis due to pollen: Secondary | ICD-10-CM | POA: Diagnosis not present

## 2017-12-21 DIAGNOSIS — Z1212 Encounter for screening for malignant neoplasm of rectum: Secondary | ICD-10-CM | POA: Diagnosis not present

## 2017-12-21 DIAGNOSIS — Z6828 Body mass index (BMI) 28.0-28.9, adult: Secondary | ICD-10-CM | POA: Diagnosis not present

## 2017-12-21 DIAGNOSIS — Z0001 Encounter for general adult medical examination with abnormal findings: Secondary | ICD-10-CM | POA: Diagnosis not present

## 2017-12-27 DIAGNOSIS — Z1231 Encounter for screening mammogram for malignant neoplasm of breast: Secondary | ICD-10-CM | POA: Diagnosis not present

## 2018-01-16 DIAGNOSIS — E119 Type 2 diabetes mellitus without complications: Secondary | ICD-10-CM | POA: Diagnosis not present

## 2018-01-19 DIAGNOSIS — Z1212 Encounter for screening for malignant neoplasm of rectum: Secondary | ICD-10-CM | POA: Diagnosis not present

## 2018-01-19 DIAGNOSIS — Z1211 Encounter for screening for malignant neoplasm of colon: Secondary | ICD-10-CM | POA: Diagnosis not present

## 2018-01-19 DIAGNOSIS — M545 Low back pain: Secondary | ICD-10-CM | POA: Diagnosis not present

## 2018-01-23 DIAGNOSIS — M1712 Unilateral primary osteoarthritis, left knee: Secondary | ICD-10-CM | POA: Diagnosis not present

## 2018-01-23 DIAGNOSIS — M5136 Other intervertebral disc degeneration, lumbar region: Secondary | ICD-10-CM | POA: Diagnosis not present

## 2018-01-23 DIAGNOSIS — M1711 Unilateral primary osteoarthritis, right knee: Secondary | ICD-10-CM | POA: Diagnosis not present

## 2018-01-23 DIAGNOSIS — M545 Low back pain: Secondary | ICD-10-CM | POA: Diagnosis not present

## 2018-03-15 DIAGNOSIS — R69 Illness, unspecified: Secondary | ICD-10-CM | POA: Diagnosis not present

## 2018-04-04 DIAGNOSIS — I10 Essential (primary) hypertension: Secondary | ICD-10-CM | POA: Diagnosis not present

## 2018-04-04 DIAGNOSIS — E782 Mixed hyperlipidemia: Secondary | ICD-10-CM | POA: Diagnosis not present

## 2018-04-04 DIAGNOSIS — E1165 Type 2 diabetes mellitus with hyperglycemia: Secondary | ICD-10-CM | POA: Diagnosis not present

## 2018-04-04 DIAGNOSIS — E039 Hypothyroidism, unspecified: Secondary | ICD-10-CM | POA: Diagnosis not present

## 2018-04-17 DIAGNOSIS — E119 Type 2 diabetes mellitus without complications: Secondary | ICD-10-CM | POA: Diagnosis not present

## 2018-04-18 DIAGNOSIS — E039 Hypothyroidism, unspecified: Secondary | ICD-10-CM | POA: Diagnosis not present

## 2018-04-18 DIAGNOSIS — Z0001 Encounter for general adult medical examination with abnormal findings: Secondary | ICD-10-CM | POA: Diagnosis not present

## 2018-04-18 DIAGNOSIS — E782 Mixed hyperlipidemia: Secondary | ICD-10-CM | POA: Diagnosis not present

## 2018-04-18 DIAGNOSIS — J301 Allergic rhinitis due to pollen: Secondary | ICD-10-CM | POA: Diagnosis not present

## 2018-04-18 DIAGNOSIS — Z6828 Body mass index (BMI) 28.0-28.9, adult: Secondary | ICD-10-CM | POA: Diagnosis not present

## 2018-04-18 DIAGNOSIS — I10 Essential (primary) hypertension: Secondary | ICD-10-CM | POA: Diagnosis not present

## 2018-04-18 DIAGNOSIS — E1169 Type 2 diabetes mellitus with other specified complication: Secondary | ICD-10-CM | POA: Diagnosis not present

## 2018-04-27 DIAGNOSIS — E119 Type 2 diabetes mellitus without complications: Secondary | ICD-10-CM | POA: Diagnosis not present

## 2018-05-03 DIAGNOSIS — E039 Hypothyroidism, unspecified: Secondary | ICD-10-CM | POA: Diagnosis not present

## 2018-05-03 DIAGNOSIS — E782 Mixed hyperlipidemia: Secondary | ICD-10-CM | POA: Diagnosis not present

## 2018-05-03 DIAGNOSIS — E1165 Type 2 diabetes mellitus with hyperglycemia: Secondary | ICD-10-CM | POA: Diagnosis not present

## 2018-05-03 DIAGNOSIS — I10 Essential (primary) hypertension: Secondary | ICD-10-CM | POA: Diagnosis not present

## 2018-07-05 DIAGNOSIS — E1165 Type 2 diabetes mellitus with hyperglycemia: Secondary | ICD-10-CM | POA: Diagnosis not present

## 2018-07-05 DIAGNOSIS — I10 Essential (primary) hypertension: Secondary | ICD-10-CM | POA: Diagnosis not present

## 2018-07-05 DIAGNOSIS — E1122 Type 2 diabetes mellitus with diabetic chronic kidney disease: Secondary | ICD-10-CM | POA: Diagnosis not present

## 2018-07-05 DIAGNOSIS — E782 Mixed hyperlipidemia: Secondary | ICD-10-CM | POA: Diagnosis not present

## 2018-07-07 DIAGNOSIS — R69 Illness, unspecified: Secondary | ICD-10-CM | POA: Diagnosis not present

## 2018-08-05 DIAGNOSIS — I10 Essential (primary) hypertension: Secondary | ICD-10-CM | POA: Diagnosis not present

## 2018-08-05 DIAGNOSIS — E1122 Type 2 diabetes mellitus with diabetic chronic kidney disease: Secondary | ICD-10-CM | POA: Diagnosis not present

## 2018-08-05 DIAGNOSIS — E1165 Type 2 diabetes mellitus with hyperglycemia: Secondary | ICD-10-CM | POA: Diagnosis not present

## 2018-08-05 DIAGNOSIS — E782 Mixed hyperlipidemia: Secondary | ICD-10-CM | POA: Diagnosis not present

## 2018-08-18 DIAGNOSIS — I10 Essential (primary) hypertension: Secondary | ICD-10-CM | POA: Diagnosis not present

## 2018-08-18 DIAGNOSIS — E039 Hypothyroidism, unspecified: Secondary | ICD-10-CM | POA: Diagnosis not present

## 2018-08-18 DIAGNOSIS — E782 Mixed hyperlipidemia: Secondary | ICD-10-CM | POA: Diagnosis not present

## 2018-08-18 DIAGNOSIS — E1122 Type 2 diabetes mellitus with diabetic chronic kidney disease: Secondary | ICD-10-CM | POA: Diagnosis not present

## 2018-08-18 DIAGNOSIS — E1165 Type 2 diabetes mellitus with hyperglycemia: Secondary | ICD-10-CM | POA: Diagnosis not present

## 2018-08-21 DIAGNOSIS — I10 Essential (primary) hypertension: Secondary | ICD-10-CM | POA: Diagnosis not present

## 2018-08-21 DIAGNOSIS — E039 Hypothyroidism, unspecified: Secondary | ICD-10-CM | POA: Diagnosis not present

## 2018-08-21 DIAGNOSIS — E782 Mixed hyperlipidemia: Secondary | ICD-10-CM | POA: Diagnosis not present

## 2018-08-21 DIAGNOSIS — J301 Allergic rhinitis due to pollen: Secondary | ICD-10-CM | POA: Diagnosis not present

## 2018-08-21 DIAGNOSIS — Z23 Encounter for immunization: Secondary | ICD-10-CM | POA: Diagnosis not present

## 2018-08-21 DIAGNOSIS — E1169 Type 2 diabetes mellitus with other specified complication: Secondary | ICD-10-CM | POA: Diagnosis not present

## 2018-08-21 DIAGNOSIS — Z6828 Body mass index (BMI) 28.0-28.9, adult: Secondary | ICD-10-CM | POA: Diagnosis not present

## 2018-08-25 DIAGNOSIS — I129 Hypertensive chronic kidney disease with stage 1 through stage 4 chronic kidney disease, or unspecified chronic kidney disease: Secondary | ICD-10-CM | POA: Diagnosis not present

## 2018-08-25 DIAGNOSIS — E785 Hyperlipidemia, unspecified: Secondary | ICD-10-CM | POA: Diagnosis not present

## 2018-08-25 DIAGNOSIS — E039 Hypothyroidism, unspecified: Secondary | ICD-10-CM | POA: Diagnosis not present

## 2018-08-25 DIAGNOSIS — D630 Anemia in neoplastic disease: Secondary | ICD-10-CM | POA: Diagnosis not present

## 2018-08-25 DIAGNOSIS — N183 Chronic kidney disease, stage 3 (moderate): Secondary | ICD-10-CM | POA: Diagnosis not present

## 2018-08-25 DIAGNOSIS — R188 Other ascites: Secondary | ICD-10-CM | POA: Diagnosis not present

## 2018-08-25 DIAGNOSIS — C169 Malignant neoplasm of stomach, unspecified: Secondary | ICD-10-CM | POA: Diagnosis not present

## 2018-08-25 DIAGNOSIS — R109 Unspecified abdominal pain: Secondary | ICD-10-CM | POA: Diagnosis not present

## 2018-08-25 DIAGNOSIS — C562 Malignant neoplasm of left ovary: Secondary | ICD-10-CM | POA: Diagnosis not present

## 2018-08-25 DIAGNOSIS — Z85118 Personal history of other malignant neoplasm of bronchus and lung: Secondary | ICD-10-CM | POA: Diagnosis not present

## 2018-08-25 DIAGNOSIS — D638 Anemia in other chronic diseases classified elsewhere: Secondary | ICD-10-CM | POA: Diagnosis not present

## 2018-08-25 DIAGNOSIS — C482 Malignant neoplasm of peritoneum, unspecified: Secondary | ICD-10-CM | POA: Diagnosis not present

## 2018-08-25 DIAGNOSIS — K449 Diaphragmatic hernia without obstruction or gangrene: Secondary | ICD-10-CM | POA: Diagnosis not present

## 2018-08-25 DIAGNOSIS — C786 Secondary malignant neoplasm of retroperitoneum and peritoneum: Secondary | ICD-10-CM | POA: Diagnosis not present

## 2018-08-25 DIAGNOSIS — K429 Umbilical hernia without obstruction or gangrene: Secondary | ICD-10-CM | POA: Diagnosis not present

## 2018-08-25 DIAGNOSIS — E1122 Type 2 diabetes mellitus with diabetic chronic kidney disease: Secondary | ICD-10-CM | POA: Diagnosis not present

## 2018-08-29 DIAGNOSIS — D529 Folate deficiency anemia, unspecified: Secondary | ICD-10-CM | POA: Diagnosis not present

## 2018-08-29 DIAGNOSIS — C786 Secondary malignant neoplasm of retroperitoneum and peritoneum: Secondary | ICD-10-CM | POA: Diagnosis not present

## 2018-08-29 DIAGNOSIS — D519 Vitamin B12 deficiency anemia, unspecified: Secondary | ICD-10-CM | POA: Diagnosis not present

## 2018-08-29 DIAGNOSIS — D638 Anemia in other chronic diseases classified elsewhere: Secondary | ICD-10-CM | POA: Diagnosis not present

## 2018-08-29 DIAGNOSIS — Z6828 Body mass index (BMI) 28.0-28.9, adult: Secondary | ICD-10-CM | POA: Diagnosis not present

## 2018-08-31 ENCOUNTER — Telehealth: Payer: Self-pay | Admitting: *Deleted

## 2018-08-31 NOTE — Telephone Encounter (Signed)
Called and spoke with the patient, scheduled an appt for 1/8. Patient asked that we add her son to the list on contacts to speak with.

## 2018-09-12 ENCOUNTER — Telehealth: Payer: Self-pay | Admitting: Oncology

## 2018-09-12 NOTE — Telephone Encounter (Signed)
Called [place] and asked if she would be able to pick up a CD of her CT scan from [facility].  She said she will go and get it this afternoon.  [place] [facility] and spoke to [employer].  She said she will have the CD ready for the patient to pick up in Medical Records.  Called [place] back and advised her to pick up the CD in medical records.  She verbalized understanding and agreement.

## 2018-09-13 ENCOUNTER — Other Ambulatory Visit (HOSPITAL_COMMUNITY): Payer: Self-pay | Admitting: Gynecologic Oncology

## 2018-09-13 ENCOUNTER — Inpatient Hospital Stay: Payer: Medicare HMO | Attending: Gynecologic Oncology | Admitting: Gynecologic Oncology

## 2018-09-13 ENCOUNTER — Inpatient Hospital Stay
Admission: RE | Admit: 2018-09-13 | Discharge: 2018-09-13 | Disposition: A | Discharge status: Home / Self Care | Payer: Self-pay | Source: Ambulatory Visit | Attending: Gynecologic Oncology | Admitting: Gynecologic Oncology

## 2018-09-13 ENCOUNTER — Encounter: Payer: Self-pay | Admitting: Gynecologic Oncology

## 2018-09-13 VITALS — BP 109/43 | HR 74 | Temp 98.1°F | Resp 18 | Ht 65.0 in | Wt 175.0 lb

## 2018-09-13 DIAGNOSIS — C801 Malignant (primary) neoplasm, unspecified: Secondary | ICD-10-CM

## 2018-09-13 DIAGNOSIS — N183 Chronic kidney disease, stage 3 (moderate): Secondary | ICD-10-CM | POA: Insufficient documentation

## 2018-09-13 DIAGNOSIS — C786 Secondary malignant neoplasm of retroperitoneum and peritoneum: Secondary | ICD-10-CM | POA: Diagnosis not present

## 2018-09-13 DIAGNOSIS — I129 Hypertensive chronic kidney disease with stage 1 through stage 4 chronic kidney disease, or unspecified chronic kidney disease: Secondary | ICD-10-CM | POA: Insufficient documentation

## 2018-09-13 DIAGNOSIS — Z85118 Personal history of other malignant neoplasm of bronchus and lung: Secondary | ICD-10-CM | POA: Insufficient documentation

## 2018-09-13 DIAGNOSIS — I959 Hypotension, unspecified: Secondary | ICD-10-CM | POA: Insufficient documentation

## 2018-09-13 DIAGNOSIS — Z87891 Personal history of nicotine dependence: Secondary | ICD-10-CM | POA: Insufficient documentation

## 2018-09-13 DIAGNOSIS — C569 Malignant neoplasm of unspecified ovary: Secondary | ICD-10-CM | POA: Insufficient documentation

## 2018-09-13 DIAGNOSIS — N179 Acute kidney failure, unspecified: Secondary | ICD-10-CM | POA: Insufficient documentation

## 2018-09-13 DIAGNOSIS — R590 Localized enlarged lymph nodes: Secondary | ICD-10-CM | POA: Insufficient documentation

## 2018-09-13 NOTE — Progress Notes (Signed)
Consult Note: Gyn-Onc  Consult was requested by Dr. Quillian Quince for the evaluation of Christy Ellis 68 y.o. female  CC:  Chief Complaint  Patient presents with  . Peritoneal carcinomatosis [facility])    Assessment/Plan:  Ms. Christy Ellis  is a 68 y.o.  year old with apparent stage IV ovarian vs fallopian tube vs primary peritoneal carcinoma.  I performed a history, physical examination, and personally reviewed the patient's imaging films including the CT abd/pelvis images from her outside scan on 08/25/18.  Given the findings of pleural thickening, cardiophrenic node >1cm, hepatic scalloping and a 1.5cm porto-caval node, I do not feel that complete surgical resection (or optimal cytoreduction) would be feasible in the primary setting. Therefore neoadjuvant chemotherapy with carboplatin and paclitaxel is indicated.   I discussed this with the patient and her step-son and the likely diagnosis, and the anticipated plan of chemotherapy. I explained that we will re-evaluate for possible interval cytoreduction after 3 cycles of chemotherapy pending response on CT imaging. She would require additional chemotherapy after interval debulking.  She will need a consultation with genetics counselors to undergo testing for BRCA and HRD.  We will schedule her for US guided biopsy of the omental cake.  She will see my colleague, Dr Heath Lark, to further discuss chemotherapy.   HPI: Ms Christy Ellis is a very pleasant 68 year old G0 who is seen in consultation at the request of Dr Quillian Quince for peritoneal carcinomatosis.   [facility] with complaints of nausea vomiting and abdominal distention.  She underwent a CT scan which demonstrated right pleural thickening and a cardiophrenic lymph node measuring 1.5 cm.  The liver demonstrated mild nodularity and scalloping of the right lobe.  There were 2 less than 1 cm lesions seen within the liver.   There was large volume ascites and a 13 cm upper abdominal left upper quadrant omental cake visualized.  There was extensive peritoneal thickening along the abdomen and pelvic peritoneum.  There is a 1.5 cm aortocaval lymph node identified.  No pelvic adenopathy was identified.  In the pelvis the left adnexa revealed a 4.6 x 3.2 cm ill-defined solid and cystic structure.  No other large dominant pelvic masses identified.  A moderate hiatal hernia was seen and a small umbilical hernia containing fat was seen.  On August 29, 2018 Ca1 25 was drawn and was elevated at 1060, and CEA was normal at 2.1.  She was eventually discharged with plan for outpatient follow-up.  The patient has a history of CKD, hypothyroidism, hypertension, and diabetes mellitus treated with oral therapy.  She lives alone however is supported by her stepson who lives close by in [place].  She is never had abdominal surgery.  She has never been pregnant.  Family history is unclear though she does report that her mother died in her 30s from something wrong with her abdomen that the patient thinks might be cancer.  Her dominant symptoms are abdominal distention, early satiety, and fatigue  Current Meds:  Outpatient Encounter Medications as of 09/13/2018  Medication Sig  . allopurinol (ZYLOPRIM) 300 MG tablet Take 300 mg by mouth daily.  Marland Kitchen aspirin EC 81 MG tablet Take 81 mg by mouth daily.  Marland Kitchen atorvastatin (LIPITOR) 80 MG tablet Take 80 mg by mouth daily.  . benazepril (LOTENSIN) 20 MG tablet Take 20 mg by mouth daily.  . Cholecalciferol (VITAMIN D3 PO) Take 1,000 Units by mouth  daily.  . cloNIDine (CATAPRES) 0.2 MG tablet Take 0.2 mg by mouth 2 (two) times daily.  Marland Kitchen diltiazem (CARDIZEM CD) 300 MG 24 hr capsule Take 300 mg by mouth daily.  . hydrochlorothiazide (HYDRODIURIL) 12.5 MG tablet Take 12.5 mg by mouth daily.  Marland Kitchen levothyroxine (SYNTHROID, LEVOTHROID) 100 MCG tablet Take 100 mcg by mouth daily before breakfast.  .  metFORMIN (GLUCOPHAGE-XR) 500 MG 24 hr tablet Take 2,000 mg by mouth daily with breakfast.   No facility-administered encounter medications on file as of 09/13/2018.     Allergy: No Known Allergies  Social Hx:   Social History   Socioeconomic History  . Marital status: Married    Spouse name: Not on file  . Number of children: Not on file  . Years of education: Not on file  . Highest education level: Not on file  Occupational History  . Not on file  Social Needs  . Financial resource strain: Not on file  . Food insecurity:    Worry: Not on file    Inability: Not on file  . Transportation needs:    Medical: Not on file    Non-medical: Not on file  Tobacco Use  . Smoking status: Former Smoker    Types: Cigarettes    Last attempt to quit: 09/05/2005    Years since quitting: 13.0  . Smokeless tobacco: Never Used  Substance and Sexual Activity  . Alcohol use: Never    Frequency: Never  . Drug use: Never  . Sexual activity: Not on file  Lifestyle  . Physical activity:    Days per week: Not on file    Minutes per session: Not on file  . Stress: Not on file  Relationships  . Social connections:    Talks on phone: Not on file    Gets together: Not on file    Attends religious service: Not on file    Active member of club or organization: Not on file    Attends meetings of clubs or organizations: Not on file    Relationship status: Not on file  . Intimate partner violence:    Fear of current or ex partner: Not on file    Emotionally abused: Not on file    Physically abused: Not on file    Forced sexual activity: Not on file  Other Topics Concern  . Not on file  Social History Narrative  . Not on file    Past Surgical Hx:  Past Surgical History:  Procedure Laterality Date  . COLONOSCOPY  02/19/2016  . OTHER SURGICAL HISTORY  09/2005   Video-assisted thoracoscopic surgery for lung cancer by Dr. Prescott Gum  . THYROID SURGERY     Some type of thyroid surgery at age  41    Past Medical Hx:  Past Medical History:  Diagnosis Date  . Chronic gouty arthritis   . Diabetes mellitus without complication (HCC)    Type 2  . Hyperlipidemia   . Hypertension   . Lung cancer ([place]) 2007   History of Non Small Cell Lung Cancer  . Thyroid disease    Hypothyroid    Past Gynecological History:  G0, no hx of abnormal paps, no postmenopausal bleeding No LMP recorded. Patient is postmenopausal.  Family Hx:  Family History  Problem Relation Age of Onset  . Stomach cancer Mother     Review of Systems:  Constitutional  Feels fatigued  ENT Normal appearing ears and nares bilaterally Skin/Breast  No rash, sores, jaundice,  itching, dryness Cardiovascular  No chest pain, shortness of breath, or edema  Pulmonary  No cough or wheeze.  Gastro Intestinal  + distension, nausea, early satiety Genito Urinary  No frequency, urgency, dysuria, no bleediing Musculo Skeletal  No myalgia, arthralgia, joint swelling or pain  Neurologic  No weakness, numbness, change in gait,  Psychology  No depression, anxiety, insomnia.   Vitals:  Blood pressure (!) 109/43, pulse 74, temperature 98.1 F (36.7 C), temperature source Oral, resp. rate 18, height _0  (1.651 m), weight 175 lb (79.4 kg), SpO2 100 %.  Physical Exam: WD in NAD Neck  Supple NROM, without any enlargements.  Lymph Node Survey No cervical supraclavicular or inguinal adenopathy Cardiovascular  Pulse normal rate, regularity and rhythm. S1 and S2 normal.  Lungs  Clear to auscultation bilateraly, without wheezes/crackles/rhonchi. Good air movement.  Skin  No rash/lesions/breakdown  Psychiatry  Alert and oriented to person, place, and time  Abdomen  Normoactive bowel sounds, abdomen soft, non-tender and obese, palpable umbilical hernia. Very distended. Back No CVA tenderness Genito Urinary  Vulva/vagina: Normal external female genitalia.  No lesions. No discharge or bleeding.  Bladder/urethra:  No  lesions or masses, well supported bladder  Vagina: normal  Cervix: Normal appearing, no lesions.  Uterus:  Small, mobile, no parametrial involvement or nodularity.  Adnexa: difficult to appreciate discrete masses. Rectal  Good tone, no masses no cul de sac nodularity.  Extremities  No bilateral cyanosis, clubbing or edema.   Thereasa Solo, MD  09/13/2018, 12:24 PM

## 2018-09-13 NOTE — Patient Instructions (Signed)
Plan to meet with Dr. Heath Lark on Friday, September 15, 2018 at the [facility].  You will receive a phone call from the hospital to arrange for a biopsy of your omentum and for possible port-a-cath placement.  Please call for any questions or concerns. You will see Dr. Denman George after you have completed three cycles for consideration of chemo at that time.

## 2018-09-15 ENCOUNTER — Inpatient Hospital Stay: Payer: Medicare HMO

## 2018-09-15 ENCOUNTER — Encounter: Payer: Self-pay | Admitting: Hematology and Oncology

## 2018-09-15 ENCOUNTER — Telehealth: Payer: Self-pay | Admitting: Hematology and Oncology

## 2018-09-15 ENCOUNTER — Inpatient Hospital Stay: Payer: Medicare HMO | Admitting: Hematology and Oncology

## 2018-09-15 ENCOUNTER — Telehealth: Payer: Self-pay | Admitting: Oncology

## 2018-09-15 VITALS — BP 126/47 | HR 83 | Temp 97.9°F | Resp 18 | Ht 65.0 in | Wt 177.0 lb

## 2018-09-15 DIAGNOSIS — C786 Secondary malignant neoplasm of retroperitoneum and peritoneum: Secondary | ICD-10-CM | POA: Diagnosis not present

## 2018-09-15 DIAGNOSIS — Z85118 Personal history of other malignant neoplasm of bronchus and lung: Secondary | ICD-10-CM | POA: Diagnosis not present

## 2018-09-15 DIAGNOSIS — I10 Essential (primary) hypertension: Secondary | ICD-10-CM | POA: Diagnosis not present

## 2018-09-15 DIAGNOSIS — Z87891 Personal history of nicotine dependence: Secondary | ICD-10-CM | POA: Diagnosis not present

## 2018-09-15 DIAGNOSIS — E119 Type 2 diabetes mellitus without complications: Secondary | ICD-10-CM | POA: Insufficient documentation

## 2018-09-15 DIAGNOSIS — C569 Malignant neoplasm of unspecified ovary: Secondary | ICD-10-CM

## 2018-09-15 DIAGNOSIS — C801 Malignant (primary) neoplasm, unspecified: Secondary | ICD-10-CM | POA: Diagnosis not present

## 2018-09-15 LAB — CBC WITH DIFFERENTIAL (CANCER CENTER ONLY)
Abs Immature Granulocytes: 0.02 10*3/uL (ref 0.00–0.07)
BASOS ABS: 0.1 10*3/uL (ref 0.0–0.1)
Basophils Relative: 1 %
EOS PCT: 4 %
Eosinophils Absolute: 0.2 10*3/uL (ref 0.0–0.5)
HEMATOCRIT: 28.8 % — AB (ref 36.0–46.0)
HEMOGLOBIN: 9 g/dL — AB (ref 12.0–15.0)
IMMATURE GRANULOCYTES: 0 %
Lymphocytes Relative: 20 %
Lymphs Abs: 1.3 10*3/uL (ref 0.7–4.0)
MCH: 26.3 pg (ref 26.0–34.0)
MCHC: 31.3 g/dL (ref 30.0–36.0)
MCV: 84.2 fL (ref 80.0–100.0)
Monocytes Absolute: 0.5 10*3/uL (ref 0.1–1.0)
Monocytes Relative: 8 %
NEUTROS ABS: 4.5 10*3/uL (ref 1.7–7.7)
NEUTROS PCT: 67 %
NRBC: 0 % (ref 0.0–0.2)
Platelet Count: 334 10*3/uL (ref 150–400)
RBC: 3.42 MIL/uL — ABNORMAL LOW (ref 3.87–5.11)
RDW: 14.3 % (ref 11.5–15.5)
WBC Count: 6.7 10*3/uL (ref 4.0–10.5)

## 2018-09-15 LAB — CMP (CANCER CENTER ONLY)
ALK PHOS: 103 U/L (ref 38–126)
ALT: 15 U/L (ref 0–44)
ANION GAP: 8 (ref 5–15)
AST: 20 U/L (ref 15–41)
Albumin: 3.1 g/dL — ABNORMAL LOW (ref 3.5–5.0)
BILIRUBIN TOTAL: 0.3 mg/dL (ref 0.3–1.2)
BUN: 19 mg/dL (ref 8–23)
CALCIUM: 8.8 mg/dL — AB (ref 8.9–10.3)
CO2: 24 mmol/L (ref 22–32)
Chloride: 104 mmol/L (ref 98–111)
Creatinine: 1.58 mg/dL — ABNORMAL HIGH (ref 0.44–1.00)
GFR, Est AFR Am: 39 mL/min — ABNORMAL LOW (ref >60)
GFR, Estimated: 33 mL/min — ABNORMAL LOW (ref >60)
Glucose, Bld: 94 mg/dL (ref 70–99)
Potassium: 4.9 mmol/L (ref 3.5–5.1)
SODIUM: 136 mmol/L (ref 135–145)
TOTAL PROTEIN: 6.6 g/dL (ref 6.5–8.1)

## 2018-09-15 NOTE — Telephone Encounter (Signed)
Manson Allan with appointment for CT Chest on 09/25/18 at 8:30 am with instructions for water only after 4:30 am.  Tiana verbalized understanding and agreement.

## 2018-09-15 NOTE — Assessment & Plan Note (Signed)
Her last CT imaging of the chest was in 2012 I plan to repeat to make sure she has no evidence of thoracic disease

## 2018-09-15 NOTE — Assessment & Plan Note (Addendum)
The patient has locally advanced disease. Due to her history of lung cancer and her advanced disease, I will order a CT scan of the contrast to complete staging She is scheduled for port placement and peritoneal biopsy on January 20 I plan to see her back on January 23 to review all test results, chemo education class and chemotherapy consent, with plan to start her on cycle 1 of carboplatin and Taxol on September 29, 2018

## 2018-09-15 NOTE — Telephone Encounter (Signed)
Called Dr. Olena Heckle office and requested copies of recent lab work.  They said the last labs were drawn on 08/29/18 and they will fax them over.

## 2018-09-15 NOTE — Telephone Encounter (Signed)
Gave avs and calendar waiting for approval for 1/24 chemo

## 2018-09-15 NOTE — Telephone Encounter (Signed)
Tried to reach regarding 1/24

## 2018-09-15 NOTE — Progress Notes (Signed)
START ON PATHWAY REGIMEN - Ovarian     A cycle is every 21 days:     Paclitaxel      Carboplatin   **Always confirm dose/schedule in your pharmacy ordering system**  Patient Characteristics: Preoperative or Nonsurgical Candidate (Clinical Staging), Newly Diagnosed, Neoadjuvant Therapy followed by Surgery Therapeutic Status: Preoperative or Nonsurgical Candidate (Clinical Staging) BRCA Mutation Status: Awaiting Test Results AJCC T Category: cT3c AJCC 8 Stage Grouping: IVA AJCC N Category: cN1 AJCC M Category: OM3T Therapy Plan: Neoadjuvant Therapy followed by Surgery Intent of Therapy: Curative Intent, Discussed with Patient

## 2018-09-15 NOTE — Progress Notes (Signed)
[name] NOTE  Patient Care Team: Caryl Bis, MD as PCP - General (Unknown Physician Specialty)  ASSESSMENT & PLAN:  Ovarian cancer [facility]) The patient has locally advanced disease. Due to her history of lung cancer and her advanced disease, I will order a CT scan of the contrast to complete staging She is scheduled for port placement and peritoneal biopsy on January 20 I plan to see her back on January 23 to review all test results, chemo education class and chemotherapy consent, with plan to start her on cycle 1 of carboplatin and Taxol on September 29, 2018   History of lung cancer Her last CT imaging of the chest was in 2012 I plan to repeat to make sure she has no evidence of thoracic disease   Orders Placed This Encounter  Procedures  . CT CHEST W CONTRAST    Standing Status:   Future    Standing Expiration Date:   09/16/2019    Order Specific Question:   If indicated for the ordered procedure, I authorize the administration of contrast media per Radiology protocol    Answer:   Yes    Order Specific Question:   Preferred imaging location?    Answer:   [facility] [place]    Order Specific Question:   Radiology Contrast Protocol - do NOT remove file path    Answer:   [email or web address]  . CBC with Differential ([place] Only)    Standing Status:   Standing    Number of Occurrences:   20    Standing Expiration Date:   09/16/2019  . CMP ([place] only)    Standing Status:   Standing    Number of Occurrences:   20    Standing Expiration Date:   09/16/2019  . CA 125    Standing Status:   Standing    Number of Occurrences:   9    Standing Expiration Date:   09/16/2019     CHIEF COMPLAINTS/PURPOSE OF CONSULTATION:  Locally advanced ovarian cancer with carcinomatosis, for further management  HISTORY OF PRESENTING ILLNESS:  Christy Ellis 68 y.o. female is here because of recent diagnosis of possible ovarian  cancer, biopsy pending She is accompanied by her Gearldine Shown today The patient is retired She had background history of lung cancer status post surgical resection in 2007.  Her last follow-up with CT surgery was in 2012.  She never received adjuvant chemotherapy or radiation treatment Starting approximately 6 months ago, she started to complain of abdominal bloating, nausea and swelling.  She also have early satiety. Of note, the patient does not have biological children.  She denies postmenopausal bleeding.  She denies abnormal Pap smear in the past Due to her significant symptoms, she presented to her local emergency room for further evaluation.  Blood work and CT imaging were done.  She was subsequently discharged with GYN oncology follow-up. She was referred here to discuss neoadjuvant chemotherapy.  I have reviewed her chart and materials related to her cancer extensively and collaborated history with the patient. Summary of oncologic history is as follows:   Ovarian cancer ([place])   08/25/2018 Initial Diagnosis    Initially presented with nausea, vomiting and abdominal distension    08/25/2018 Imaging    CT scan demonstrated right pleural thickening and a cardiophrenic lymph node measuring 1.5 cm.  The liver demonstrated mild nodularity and scalloping of the right lobe.  There were 2 less than 1 cm lesions seen  within the liver.  There was large volume ascites and a 13 cm upper abdominal left upper quadrant omental cake visualized.  There was extensive peritoneal thickening along the abdomen and pelvic peritoneum.  There is a 1.5 cm aortocaval lymph node identified.  No pelvic adenopathy was identified.  In the pelvis the left adnexa revealed a 4.6 x 3.2 cm ill-defined solid and cystic structure.  No other large dominant pelvic masses identified.  A moderate hiatal hernia was seen and a small umbilical hernia containing fat was seen.    08/29/2018 Tumor Marker    Patient's tumor was tested  for the following markers: CA-125. Results of the tumor marker test revealed 1060    09/15/2018 Cancer Staging    Staging form: Ovary, Fallopian Tube, and Primary Peritoneal Carcinoma, AJCC 8th Edition - Clinical: Stage IV (cT3c, cN1, cM1) - Signed by Heath Lark, MD on 09/15/2018    She continues to have mild constipation, resolved with laxative.  She has some mild nausea but no vomiting She is able to maintain her weight Her diabetes is well controlled.  She denies peripheral neuropathy from it From the lung cancer perspective, she denies recent cough, chest pain or shortness of breath  MEDICAL HISTORY:  Past Medical History:  Diagnosis Date  . Chronic gouty arthritis   . Diabetes mellitus without complication (HCC)    Type 2  . Hyperlipidemia   . Hypertension   . Lung cancer ([place]) 2007   History of Non Small Cell Lung Cancer  . Thyroid disease    Hypothyroid    SURGICAL HISTORY: Past Surgical History:  Procedure Laterality Date  . COLONOSCOPY  02/19/2016  . OTHER SURGICAL HISTORY  09/2005   Video-assisted thoracoscopic surgery for lung cancer by Dr. Prescott Gum  . THYROID SURGERY     Some type of thyroid surgery at age 17    SOCIAL HISTORY: Social History   Socioeconomic History  . Marital status: Married    Spouse name: Not on file  . Number of children: 0  . Years of education: Not on file  . Highest education level: Not on file  Occupational History  . Occupation: retired  Scientific laboratory technician  . Financial resource strain: Not on file  . Food insecurity:    Worry: Not on file    Inability: Not on file  . Transportation needs:    Medical: Not on file    Non-medical: Not on file  Tobacco Use  . Smoking status: Former Smoker    Types: Cigarettes    Last attempt to quit: 09/05/2005    Years since quitting: 13.0  . Smokeless tobacco: Never Used  Substance and Sexual Activity  . Alcohol use: Never    Frequency: Never  . Drug use: Never  . Sexual activity: Not on  file  Lifestyle  . Physical activity:    Days per week: Not on file    Minutes per session: Not on file  . Stress: Not on file  Relationships  . Social connections:    Talks on phone: Not on file    Gets together: Not on file    Attends religious service: Not on file    Active member of club or organization: Not on file    Attends meetings of clubs or organizations: Not on file    Relationship status: Not on file  . Intimate partner violence:    Fear of current or ex partner: Not on file    Emotionally abused: Not  on file    Physically abused: Not on file    Forced sexual activity: Not on file  Other Topics Concern  . Not on file  Social History Narrative  . Not on file    FAMILY HISTORY: Family History  Problem Relation Age of Onset  . Stomach cancer Mother     ALLERGIES:  has No Known Allergies.  MEDICATIONS:  Current Outpatient Medications  Medication Sig Dispense Refill  . allopurinol (ZYLOPRIM) 300 MG tablet Take 300 mg by mouth daily.    Marland Kitchen aspirin EC 81 MG tablet Take 81 mg by mouth daily.    Marland Kitchen atorvastatin (LIPITOR) 80 MG tablet Take 80 mg by mouth daily.    . benazepril (LOTENSIN) 20 MG tablet Take 20 mg by mouth daily.    . Cholecalciferol (VITAMIN D3 PO) Take 1,000 Units by mouth daily.    . cloNIDine (CATAPRES) 0.2 MG tablet Take 0.2 mg by mouth 2 (two) times daily.    Marland Kitchen diltiazem (CARDIZEM CD) 300 MG 24 hr capsule Take 300 mg by mouth daily.    . hydrochlorothiazide (HYDRODIURIL) 12.5 MG tablet Take 12.5 mg by mouth daily.    Marland Kitchen levothyroxine (SYNTHROID, LEVOTHROID) 100 MCG tablet Take 100 mcg by mouth daily before breakfast.    . metFORMIN (GLUCOPHAGE-XR) 500 MG 24 hr tablet Take 2,000 mg by mouth daily with breakfast.     No current facility-administered medications for this visit.     REVIEW OF SYSTEMS:   Constitutional: Denies fevers, chills or abnormal night sweats Eyes: Denies blurriness of vision, double vision or watery eyes Ears, nose, mouth,  throat, and face: Denies mucositis or sore throat Respiratory: Denies cough, dyspnea or wheezes Cardiovascular: Denies palpitation, chest discomfort or lower extremity swelling Skin: Denies abnormal skin rashes Lymphatics: Denies new lymphadenopathy or easy bruising Neurological:Denies numbness, tingling or new weaknesses Behavioral/Psych: Mood is stable, no new changes  All other systems were reviewed with the patient and are negative.  PHYSICAL EXAMINATION: ECOG PERFORMANCE STATUS: 1 - Symptomatic but completely ambulatory  Vitals:   09/15/18 1148  BP: (!) 126/47  Pulse: 83  Resp: 18  Temp: 97.9 F (36.6 C)  SpO2: 100%   Filed Weights   09/15/18 1148  Weight: 177 lb (80.3 kg)    GENERAL:alert, no distress and comfortable SKIN: skin color, texture, turgor are normal, no rashes or significant lesions EYES: normal, conjunctiva are pink and non-injected, sclera clear OROPHARYNX:no exudate, no erythema and lips, buccal mucosa, and tongue normal  NECK: supple, thyroid normal size, non-tender, without nodularity LYMPH:  no palpable lymphadenopathy in the cervical, axillary or inguinal LUNGS: clear to auscultation and percussion with normal breathing effort HEART: regular rate & rhythm and no murmurs and no lower extremity edema ABDOMEN:abdomen soft, significant distention with palpable carcinomatosis Musculoskeletal:no cyanosis of digits and no clubbing  PSYCH: alert & oriented x 3 with fluent speech NEURO: no focal motor/sensory deficits  LABORATORY DATA:  I have reviewed the data as listed Lab Results  Component Value Date   WBC 6.7 09/15/2018   HGB 9.0 (L) 09/15/2018   HCT 28.8 (L) 09/15/2018   MCV 84.2 09/15/2018   PLT 334 09/15/2018   No results for input(s): NA, K, CL, CO2, GLUCOSE, BUN, CREATININE, CALCIUM, GFRNONAA, GFRAA, PROT, ALBUMIN, AST, ALT, ALKPHOS, BILITOT, BILIDIR, IBILI in the last 8760 hours.  RADIOGRAPHIC STUDIES: I have reviewed outside imaging  studies I have personally reviewed the radiological images as listed and agreed with the findings in the  report. Ct Outside Films Body  Result Date: 09/13/2018 This examination belongs to an outside facility and is stored here for comparison purposes only.  Contact the originating outside institution for any associated report or interpretation.  Ct Outside Films Body  Result Date: 09/13/2018 This examination belongs to an outside facility and is stored here for comparison purposes only.  Contact the originating outside institution for any associated report or interpretation.   I spent 55 minutes counseling the patient face to face. The total time spent in the appointment was 60 minutes and more than 50% was on counseling.  All questions were answered. The patient knows to call the clinic with any problems, questions or concerns.  Heath Lark, MD 09/15/2018 12:52 PM

## 2018-09-16 LAB — CA 125: CANCER ANTIGEN (CA) 125: 1491 U/mL — AB (ref 0.0–38.1)

## 2018-09-19 ENCOUNTER — Telehealth: Payer: Self-pay | Admitting: Oncology

## 2018-09-19 NOTE — Telephone Encounter (Signed)
Called Christy Ellis and notified her of the 09/29/18 infusion appointment at 8 am.  She verbalized understanding and agreement.

## 2018-09-22 ENCOUNTER — Telehealth: Payer: Self-pay | Admitting: Oncology

## 2018-09-22 ENCOUNTER — Other Ambulatory Visit: Payer: Self-pay | Admitting: Radiology

## 2018-09-22 NOTE — Telephone Encounter (Signed)
Called Tiffany in IR to see if labs (CBC and CMP) can be drawn before patient's biopsy.  She said she will make sure they are aware.

## 2018-09-25 ENCOUNTER — Ambulatory Visit (HOSPITAL_COMMUNITY)
Admission: RE | Admit: 2018-09-25 | Discharge: 2018-09-25 | Disposition: A | Discharge status: Home / Self Care | Payer: Medicare HMO | Source: Ambulatory Visit | Attending: Gynecologic Oncology | Admitting: Gynecologic Oncology

## 2018-09-25 ENCOUNTER — Other Ambulatory Visit: Payer: Self-pay

## 2018-09-25 ENCOUNTER — Telehealth: Payer: Self-pay | Admitting: Oncology

## 2018-09-25 ENCOUNTER — Ambulatory Visit (HOSPITAL_COMMUNITY): Payer: Medicare HMO

## 2018-09-25 ENCOUNTER — Encounter (HOSPITAL_COMMUNITY): Payer: Self-pay

## 2018-09-25 ENCOUNTER — Ambulatory Visit (HOSPITAL_COMMUNITY)
Admission: RE | Admit: 2018-09-25 | Discharge: 2018-09-25 | Disposition: A | Discharge status: Home / Self Care | Payer: Medicare HMO | Source: Ambulatory Visit | Attending: Hematology and Oncology | Admitting: Hematology and Oncology

## 2018-09-25 ENCOUNTER — Other Ambulatory Visit: Payer: Self-pay | Admitting: Gynecologic Oncology

## 2018-09-25 DIAGNOSIS — E785 Hyperlipidemia, unspecified: Secondary | ICD-10-CM | POA: Diagnosis not present

## 2018-09-25 DIAGNOSIS — Z79890 Hormone replacement therapy (postmenopausal): Secondary | ICD-10-CM | POA: Diagnosis not present

## 2018-09-25 DIAGNOSIS — C762 Malignant neoplasm of abdomen: Secondary | ICD-10-CM | POA: Diagnosis not present

## 2018-09-25 DIAGNOSIS — Z7984 Long term (current) use of oral hypoglycemic drugs: Secondary | ICD-10-CM | POA: Insufficient documentation

## 2018-09-25 DIAGNOSIS — I10 Essential (primary) hypertension: Secondary | ICD-10-CM | POA: Insufficient documentation

## 2018-09-25 DIAGNOSIS — C786 Secondary malignant neoplasm of retroperitoneum and peritoneum: Secondary | ICD-10-CM | POA: Diagnosis not present

## 2018-09-25 DIAGNOSIS — Z79899 Other long term (current) drug therapy: Secondary | ICD-10-CM | POA: Insufficient documentation

## 2018-09-25 DIAGNOSIS — C801 Malignant (primary) neoplasm, unspecified: Secondary | ICD-10-CM | POA: Insufficient documentation

## 2018-09-25 DIAGNOSIS — Z7982 Long term (current) use of aspirin: Secondary | ICD-10-CM | POA: Insufficient documentation

## 2018-09-25 DIAGNOSIS — E039 Hypothyroidism, unspecified: Secondary | ICD-10-CM | POA: Diagnosis not present

## 2018-09-25 DIAGNOSIS — E119 Type 2 diabetes mellitus without complications: Secondary | ICD-10-CM | POA: Insufficient documentation

## 2018-09-25 DIAGNOSIS — Z87891 Personal history of nicotine dependence: Secondary | ICD-10-CM | POA: Insufficient documentation

## 2018-09-25 DIAGNOSIS — C481 Malignant neoplasm of specified parts of peritoneum: Secondary | ICD-10-CM | POA: Diagnosis not present

## 2018-09-25 DIAGNOSIS — Z452 Encounter for adjustment and management of vascular access device: Secondary | ICD-10-CM | POA: Diagnosis not present

## 2018-09-25 DIAGNOSIS — R1909 Other intra-abdominal and pelvic swelling, mass and lump: Secondary | ICD-10-CM | POA: Diagnosis not present

## 2018-09-25 HISTORY — PX: IR IMAGING GUIDED PORT INSERTION: IMG5740

## 2018-09-25 HISTORY — PX: IR US GUIDE BX ASP/DRAIN: IMG2392

## 2018-09-25 LAB — CBC WITH DIFFERENTIAL/PLATELET
Abs Immature Granulocytes: 0.04 10*3/uL (ref 0.00–0.07)
Basophils Absolute: 0.1 10*3/uL (ref 0.0–0.1)
Basophils Relative: 1 %
Eosinophils Absolute: 0.3 10*3/uL (ref 0.0–0.5)
Eosinophils Relative: 5 %
HEMATOCRIT: 30.3 % — AB (ref 36.0–46.0)
Hemoglobin: 9.2 g/dL — ABNORMAL LOW (ref 12.0–15.0)
Immature Granulocytes: 1 %
Lymphocytes Relative: 21 %
Lymphs Abs: 1.1 10*3/uL (ref 0.7–4.0)
MCH: 26.3 pg (ref 26.0–34.0)
MCHC: 30.4 g/dL (ref 30.0–36.0)
MCV: 86.6 fL (ref 80.0–100.0)
Monocytes Absolute: 0.5 10*3/uL (ref 0.1–1.0)
Monocytes Relative: 9 %
NEUTROS ABS: 3.5 10*3/uL (ref 1.7–7.7)
Neutrophils Relative %: 63 %
Platelets: 370 10*3/uL (ref 150–400)
RBC: 3.5 MIL/uL — ABNORMAL LOW (ref 3.87–5.11)
RDW: 14.3 % (ref 11.5–15.5)
WBC: 5.6 10*3/uL (ref 4.0–10.5)
nRBC: 0 % (ref 0.0–0.2)

## 2018-09-25 LAB — COMPREHENSIVE METABOLIC PANEL
ALT: 25 U/L (ref 0–44)
AST: 38 U/L (ref 15–41)
Albumin: 3.6 g/dL (ref 3.5–5.0)
Alkaline Phosphatase: 91 U/L (ref 38–126)
Anion gap: 11 (ref 5–15)
BILIRUBIN TOTAL: 0.8 mg/dL (ref 0.3–1.2)
BUN: 25 mg/dL — ABNORMAL HIGH (ref 8–23)
CO2: 21 mmol/L — ABNORMAL LOW (ref 22–32)
CREATININE: 1.74 mg/dL — AB (ref 0.44–1.00)
Calcium: 9 mg/dL (ref 8.9–10.3)
Chloride: 103 mmol/L (ref 98–111)
GFR calc Af Amer: 34 mL/min — ABNORMAL LOW (ref >60)
GFR calc non Af Amer: 30 mL/min — ABNORMAL LOW (ref >60)
Glucose, Bld: 94 mg/dL (ref 70–99)
Potassium: 3.7 mmol/L (ref 3.5–5.1)
Sodium: 135 mmol/L (ref 135–145)
Total Protein: 7.5 g/dL (ref 6.5–8.1)

## 2018-09-25 LAB — PROTIME-INR
INR: 0.95
Prothrombin Time: 12.6 seconds (ref 11.4–15.2)

## 2018-09-25 LAB — GLUCOSE, CAPILLARY: Glucose-Capillary: 90 mg/dL (ref 70–99)

## 2018-09-25 MED ORDER — HEPARIN SOD (PORK) LOCK FLUSH 100 UNIT/ML IV SOLN
INTRAVENOUS | Status: AC | PRN
  Administered 2018-09-25: 500 [IU] via INTRAVENOUS

## 2018-09-25 MED ORDER — FENTANYL CITRATE (PF) 100 MCG/2ML IJ SOLN
INTRAMUSCULAR | Status: AC
  Filled 2018-09-25: qty 4

## 2018-09-25 MED ORDER — HEPARIN SOD (PORK) LOCK FLUSH 100 UNIT/ML IV SOLN
INTRAVENOUS | Status: AC
  Filled 2018-09-25: qty 5

## 2018-09-25 MED ORDER — MIDAZOLAM HCL 2 MG/2ML IJ SOLN
INTRAMUSCULAR | Status: AC | PRN
  Administered 2018-09-25 (×4): 1 mg via INTRAVENOUS

## 2018-09-25 MED ORDER — MIDAZOLAM HCL 2 MG/2ML IJ SOLN
INTRAMUSCULAR | Status: AC
  Filled 2018-09-25: qty 4

## 2018-09-25 MED ORDER — LIDOCAINE HCL 1 % IJ SOLN
INTRAMUSCULAR | Status: AC
  Filled 2018-09-25: qty 20

## 2018-09-25 MED ORDER — SODIUM CHLORIDE 0.9 % IV SOLN
INTRAVENOUS | Status: DC
  Administered 2018-09-25: 12:00:00 via INTRAVENOUS

## 2018-09-25 MED ORDER — FENTANYL CITRATE (PF) 100 MCG/2ML IJ SOLN
INTRAMUSCULAR | Status: AC | PRN
  Administered 2018-09-25 (×2): 50 ug via INTRAVENOUS

## 2018-09-25 MED ORDER — LIDOCAINE HCL (PF) 1 % IJ SOLN
INTRAMUSCULAR | Status: AC | PRN
  Administered 2018-09-25: 10 mL
  Administered 2018-09-25: 10 mL via INTRADERMAL

## 2018-09-25 MED ORDER — MIDAZOLAM HCL 2 MG/2ML IJ SOLN
INTRAMUSCULAR | Status: AC
  Filled 2018-09-25: qty 2

## 2018-09-25 MED ORDER — CEFAZOLIN SODIUM-DEXTROSE 2-4 GM/100ML-% IV SOLN
INTRAVENOUS | Status: AC
  Administered 2018-09-25: 2 g via INTRAVENOUS
  Filled 2018-09-25: qty 100

## 2018-09-25 MED ORDER — MIDAZOLAM HCL 2 MG/2ML IJ SOLN
INTRAMUSCULAR | Status: AC | PRN
  Administered 2018-09-25: 1 mg via INTRAVENOUS

## 2018-09-25 MED ORDER — CEFAZOLIN SODIUM-DEXTROSE 2-4 GM/100ML-% IV SOLN
2.0000 g | INTRAVENOUS | Status: AC
  Administered 2018-09-25: 2 g via INTRAVENOUS

## 2018-09-25 NOTE — Telephone Encounter (Signed)
[place] regarding her CT Scan this morning.  She said she didn't go because someone had called her and told her would be sitting too long before her appointment at 11:30.  Rescheduled the scan for 09/26/18 at 4 pm and notified patient of appointment.  She is going to check with her step son to make sure she has transportation and will call back.

## 2018-09-25 NOTE — Procedures (Signed)
RIJV PAC Omental mass Bx EBL 0 SVC RA Comp 0

## 2018-09-25 NOTE — Consult Note (Signed)
Chief Complaint: Patient was seen in consultation today for image guided umbilical nodule biopsy and Port-A-Cath placement  Referring Physician(s): Christy Ellis,E/Christy Ellis,N  Supervising Physician: Christy Ellis  Patient Status: [facility] - Out-pt  History of Present Illness: Christy Ellis is a 68 y.o. female, ex-smoker, with remote history of right lung cancer, status post resection, who now presents with history of nausea, vomiting, abdominal distention, and elevated CA 125 .  Outside CT scan has revealed right pleural thickening, 1.5 cm cardiophrenic lymph node, mild nodularity and scalloping of the liver with small lesions, ascites, omental caking, ill defined left adnexal solid/ cystic structure .  Findings are concerning for fallopian tube versus ovarian carcinoma. She presents today for image guided umbilical nodule biopsy as well as Port-A-Cath placement.  Past Medical History:  Diagnosis Date  . Chronic gouty arthritis   . Diabetes mellitus without complication (HCC)    Type 2  . Hyperlipidemia   . Hypertension   . Lung cancer ([place]) 2007   History of Non Small Cell Lung Cancer  . Thyroid disease    Hypothyroid    Past Surgical History:  Procedure Laterality Date  . COLONOSCOPY  02/19/2016  . OTHER SURGICAL HISTORY  09/2005   Video-assisted thoracoscopic surgery for lung cancer by Dr. Prescott Ellis  . THYROID SURGERY     Some type of thyroid surgery at age 28    Allergies: Patient has no known allergies.  Medications: Prior to Admission medications   Medication Sig Start Date End Date Taking? Authorizing Provider  allopurinol (ZYLOPRIM) 300 MG tablet Take 300 mg by mouth daily.   Yes [provider]  aspirin EC 81 MG tablet Take 81 mg by mouth daily.   Yes [provider]  atorvastatin (LIPITOR) 80 MG tablet Take 80 mg by mouth daily.   Yes [provider]  benazepril (LOTENSIN) 20 MG tablet Take 20 mg by mouth daily.   Yes [provider]   Cholecalciferol (VITAMIN D3 PO) Take 1,000 Units by mouth daily.   Yes [provider]  cloNIDine (CATAPRES) 0.2 MG tablet Take 0.2 mg by mouth 2 (two) times daily.   Yes [provider]  diltiazem (CARDIZEM CD) 300 MG 24 hr capsule Take 300 mg by mouth daily.   Yes [provider]  hydrochlorothiazide (HYDRODIURIL) 12.5 MG tablet Take 12.5 mg by mouth daily.   Yes [provider]  levothyroxine (SYNTHROID, LEVOTHROID) 100 MCG tablet Take 100 mcg by mouth daily before breakfast.   Yes [provider]  metFORMIN (GLUCOPHAGE-XR) 500 MG 24 hr tablet Take 2,000 mg by mouth daily with breakfast.   Yes [provider]     Family History  Problem Relation Age of Onset  . Stomach cancer Mother     Social History   Socioeconomic History  . Marital status: Married    Spouse name: Christy Ellis  . Number of children: 0  . Years of education: Christy Ellis  . Highest education level: Christy Ellis  Occupational History  . Occupation: retired  Scientific laboratory technician  . Financial resource strain: Christy Ellis  . Food insecurity:    Worry: Christy Ellis    Inability: Christy Ellis  . Transportation needs:    Medical: Christy Ellis    Non-medical: Christy Ellis  Tobacco Use  . Smoking status: Former Smoker    Types: Cigarettes    Last attempt to quit: 09/05/2005    Years since quitting:  13.0  . Smokeless tobacco: Never Used  Substance and Sexual Activity  . Alcohol use: Never    Frequency: Never  . Drug use: Never  . Sexual activity: Christy Ellis  Lifestyle  . Physical activity:    Days per week: Christy Ellis    Minutes per session: Christy Ellis  . Stress: Christy Ellis  Relationships  . Social connections:    Talks on phone: Christy Ellis    Gets together: Christy Ellis    Attends religious service: Christy Ellis    Active member of club or organization: Christy Ellis    Attends meetings of clubs or organizations: Christy Ellis    Relationship status: Christy on  Ellis  Other Topics Concern  . Christy Ellis  Social History Narrative  . Christy Ellis      Review of Systems see above; denies fever, headache, chest pain, worsening dyspnea, back pain, or abnormal bleeding.  She does have occasional cough, occasional pelvic discomfort  Vital Signs: BP 125/62 (BP Location: Right Arm)   Pulse 81   Temp 97.7 F (36.5 C) (Oral)   Resp 18   SpO2 100%   Physical Exam awake, alert.  Chest clear to auscultation bilaterally.  Heart with regular rate and rhythm.  Abdomen distended, positive bowel sounds, umbilical fullness/hernia/?carcinomatosis, no lower extremity edema.  Imaging: No results found.  Labs:  CBC: Recent Labs    09/15/18 1232 09/25/18 1148  WBC 6.7 5.6  HGB 9.0* 9.2*  HCT 28.8* 30.3*  PLT 334 370    COAGS: Recent Labs    09/25/18 1148  INR 0.95    BMP: Recent Labs    09/15/18 1232  NA 136  K 4.9  CL 104  CO2 24  GLUCOSE 94  BUN 19  CALCIUM 8.8*  CREATININE 1.58*  GFRNONAA 33*  GFRAA 39*    LIVER FUNCTION TESTS: Recent Labs    09/15/18 1232  BILITOT 0.3  AST 20  ALT 15  ALKPHOS 103  PROT 6.6  ALBUMIN 3.1*    TUMOR MARKERS: No results for input(s): AFPTM, CEA, CA199, CHROMGRNA in the last 8760 hours.  Assessment and Plan: 68 y.o. female, ex-smoker, with remote history of right lung cancer, status post resection, who now presents with history of nausea, vomiting, abdominal distention, and elevated CA 125 .  Outside CT scan has revealed right pleural thickening, 1.5 cm cardiophrenic lymph node, mild nodularity and scalloping of the liver with small lesions, ascites, omental caking, ill defined left adnexal solid/ cystic structure .  Findings are concerning for fallopian tube versus ovarian carcinoma. She presents today for image guided umbilical nodule biopsy as well as Port-A-Cath placement.  Details/risks of procedures, including but Christy limited to, internal bleeding, infection, injury to adjacent  structures discussed with patient with her understanding and consent.   Thank you for this interesting consult.  I greatly enjoyed meeting Christy Ellis and look forward to participating in their care.  A copy of this report was sent to the requesting provider on this date.  Electronically Signed: D. Rowe Robert, PA-C 09/25/2018, 12:10 PM   I spent a total of 30 minutes in face to face in clinical consultation, greater than 50% of which was counseling/coordinating care for image guided umbilical nodule biopsy and Port-A-Cath placement

## 2018-09-25 NOTE — Discharge Instructions (Signed)
Moderate Conscious Sedation, Adult, Care After These instructions provide you with information about caring for yourself after your procedure. Your health care provider may also give you more specific instructions. Your treatment has been planned according to current medical practices, but problems sometimes occur. Call your health care provider if you have any problems or questions after your procedure. What can I expect after the procedure? After your procedure, it is common:  To feel sleepy for several hours.  To feel clumsy and have poor balance for several hours.  To have poor judgment for several hours.  To vomit if you eat too soon. Follow these instructions at home: For at least 24 hours after the procedure:   Do not: ? Participate in activities where you could fall or become injured. ? Drive. ? Use heavy machinery. ? Drink alcohol. ? Take sleeping pills or medicines that cause drowsiness. ? Make important decisions or sign legal documents. ? Take care of children on your own.  Rest. Eating and drinking  Follow the diet recommended by your health care provider.  If you vomit: ? Drink water, juice, or soup when you can drink without vomiting. ? Make sure you have little or no nausea before eating solid foods. General instructions  Have a responsible adult stay with you until you are awake and alert.  Take over-the-counter and prescription medicines only as told by your health care provider.  If you smoke, do not smoke without supervision.  Keep all follow-up visits as told by your health care provider. This is important. Contact a health care provider if:  You keep feeling nauseous or you keep vomiting.  You feel light-headed.  You develop a rash.  You have a fever. Get help right away if:  You have trouble breathing. This information is not intended to replace advice given to you by your health care provider. Make sure you discuss any questions you have  with your health care provider. Document Released: 06/13/2013 Document Revised: 01/26/2016 Document Reviewed: 12/13/2015 Elsevier Interactive Patient Education  2019 [employer] may remove your dressing and shower tomorrow.  DO NOT use EMLA cream for 2 weeks after port placement as this cream will remove surgical glue on your incision.  Implanted [facility] Guide An implanted port is a device that is placed under the skin. It is usually placed in the chest. The device can be used to give IV medicine, to take blood, or for dialysis. You may have an implanted port if:  You need IV medicine that would be irritating to the small veins in your hands or arms.  You need IV medicines, such as antibiotics, for a long period of time.  You need IV nutrition for a long period of time.  You need dialysis. Having a port means that your health care provider will not need to use the veins in your arms for these procedures. You may have fewer limitations when using a port than you would if you used other types of long-term IVs, and you will likely be able to return to normal activities after your incision heals. An implanted port has two main parts:  Reservoir. The reservoir is the part where a needle is inserted to give medicines or draw blood. The reservoir is round. After it is placed, it appears as a small, raised area under your skin.  Catheter. The catheter is a thin, flexible tube that connects the reservoir to a vein. Medicine that is inserted into the reservoir  goes into the catheter and then into the vein. How is my port accessed? To access your port:  A numbing cream may be placed on the skin over the port site.  Your health care provider will put on a mask and sterile gloves.  The skin over your port will be cleaned carefully with a germ-killing soap and allowed to dry.  Your health care provider will gently pinch the port and insert a needle into it.  Your health care provider  will check for a blood return to make sure the port is in the vein and is not clogged.  If your port needs to remain accessed to get medicine continuously (constant infusion), your health care provider will place a clear bandage (dressing) over the needle site. The dressing and needle will need to be changed every week, or as told by your health care provider. What is flushing? Flushing helps keep the port from getting clogged. Follow instructions from your health care provider about how and when to flush the port. Ports are usually flushed with saline solution or a medicine called heparin. The need for flushing will depend on how the port is used:  If the port is only used from time to time to give medicines or draw blood, the port may need to be flushed: ? Before and after medicines have been given. ? Before and after blood has been drawn. ? As part of routine maintenance. Flushing may be recommended every 4-6 weeks.  If a constant infusion is running, the port may not need to be flushed.  Throw away any syringes in a disposal container that is meant for sharp items (sharps container). You can buy a sharps container from a pharmacy, or you can make one by using an empty hard plastic bottle with a cover. How long will my port stay implanted? The port can stay in for as long as your health care provider thinks it is needed. When it is time for the port to come out, a surgery will be done to remove it. The surgery will be similar to the procedure that was done to put the port in. Follow these instructions at home:   Flush your port as told by your health care provider.  If you need an infusion over several days, follow instructions from your health care provider about how to take care of your port site. Make sure you: ? Wash your hands with soap and water before you change your dressing. If soap and water are not available, use alcohol-based hand sanitizer. ? Change your dressing as told by  your health care provider. ? Place any used dressings or infusion bags into a plastic bag. Throw that bag in the trash. ? Keep the dressing that covers the needle clean and dry. Do not get it wet. ? Do not use scissors or sharp objects near the tube. ? Keep the tube clamped, unless it is being used.  Check your port site every day for signs of infection. Check for: ? Redness, swelling, or pain. ? Fluid or blood. ? Pus or a bad smell.  Protect the skin around the port site. ? Avoid wearing bra straps that rub or irritate the site. ? Protect the skin around your port from seat belts. Place a soft pad over your chest if needed.  Bathe or shower as told by your health care provider. The site may get wet as long as you are not actively receiving an infusion.  Return to your  normal activities as told by your health care provider. Ask your health care provider what activities are safe for you.  Carry a medical alert card or wear a medical alert bracelet at all times. This will let health care providers know that you have an implanted port in case of an emergency. Get help right away if:  You have redness, swelling, or pain at the port site.  You have fluid or blood coming from your port site.  You have pus or a bad smell coming from the port site.  You have a fever. Summary  Implanted ports are usually placed in the chest for long-term IV access.  Follow instructions from your health care provider about flushing the port and changing bandages (dressings).  Take care of the area around your port by avoiding clothing that puts pressure on the area, and by watching for signs of infection.  Protect the skin around your port from seat belts. Place a soft pad over your chest if needed.  Get help right away if you have a fever or you have redness, swelling, pain, drainage, or a bad smell at the port site. This information is not intended to replace advice given to you by your health care  provider. Make sure you discuss any questions you have with your health care provider. Document Released: 08/23/2005 Document Revised: 09/25/2016 Document Reviewed: 09/25/2016 Elsevier Interactive Patient Education  2019 [employer]   Needle Biopsy, Care After These instructions tell you how to care for yourself after your procedure. Your doctor may also give you more specific instructions. Call your doctor if you have any problems or questions. What can I expect after the procedure? After the procedure, it is common to have:  Soreness.  Bruising.  Mild pain. Follow these instructions at home:   Return to your normal activities as told by your doctor. Ask your doctor what activities are safe for you.  Take over-the-counter and prescription medicines only as told by your doctor.  Wash your hands with soap and water before you change your bandage (dressing). If you cannot use soap and water, use hand sanitizer.  Follow instructions from your doctor about: ? How to take care of your puncture site. ? When and how to change your bandage. ? When to remove your bandage.  Check your puncture site every day for signs of infection. Watch for: ? Redness, swelling, or pain. ? Fluid or blood. ? Pus or a bad smell. ? Warmth.  Do not take baths, swim, or use a hot tub until your doctor approves. Ask your doctor if you may take showers. You may only be allowed to take sponge baths.  Keep all follow-up visits as told by your doctor. This is important. Contact a doctor if you have:  A fever.  Redness, swelling, or pain at the puncture site, and it lasts longer than a few days.  Fluid, blood, or pus coming from the puncture site.  Warmth coming from the puncture site. Get help right away if:  You have a lot of bleeding from the puncture site. Summary  After the procedure, it is common to have soreness, bruising, or mild pain at the puncture site.  Check your puncture site every  day for signs of infection, such as redness, swelling, or pain.  Get help right away if you have severe bleeding from your puncture site. This information is not intended to replace advice given to you by your health care provider. Make sure you discuss  any questions you have with your health care provider. Document Released: 08/05/2008 Document Revised: 09/05/2017 Document Reviewed: 09/05/2017 Elsevier Interactive Patient Education  2019 [employer].

## 2018-09-25 NOTE — Sedation Documentation (Signed)
Port a cath procedure ended. Patient prepped for biopsy.

## 2018-09-26 ENCOUNTER — Encounter (HOSPITAL_COMMUNITY): Payer: Self-pay

## 2018-09-26 ENCOUNTER — Ambulatory Visit (HOSPITAL_COMMUNITY)
Admission: RE | Admit: 2018-09-26 | Discharge: 2018-09-26 | Disposition: A | Discharge status: Home / Self Care | Payer: Medicare HMO | Source: Ambulatory Visit | Attending: Hematology and Oncology | Admitting: Hematology and Oncology

## 2018-09-26 DIAGNOSIS — C569 Malignant neoplasm of unspecified ovary: Secondary | ICD-10-CM | POA: Insufficient documentation

## 2018-09-26 DIAGNOSIS — C3490 Malignant neoplasm of unspecified part of unspecified bronchus or lung: Secondary | ICD-10-CM | POA: Diagnosis not present

## 2018-09-26 DIAGNOSIS — Z85118 Personal history of other malignant neoplasm of bronchus and lung: Secondary | ICD-10-CM | POA: Diagnosis not present

## 2018-09-26 DIAGNOSIS — D638 Anemia in other chronic diseases classified elsewhere: Secondary | ICD-10-CM | POA: Diagnosis not present

## 2018-09-26 DIAGNOSIS — C786 Secondary malignant neoplasm of retroperitoneum and peritoneum: Secondary | ICD-10-CM | POA: Diagnosis not present

## 2018-09-26 MED ORDER — IOHEXOL 300 MG/ML  SOLN
75.0000 mL | Freq: Once | INTRAMUSCULAR | Status: AC | PRN
  Administered 2018-09-26: 75 mL via INTRAVENOUS

## 2018-09-26 MED ORDER — SODIUM CHLORIDE (PF) 0.9 % IJ SOLN
INTRAMUSCULAR | Status: AC
  Filled 2018-09-26: qty 50

## 2018-09-26 NOTE — Telephone Encounter (Signed)
Called [place] and she does have transportation for CT scan this afternoon.  Reviewed time to check in and NPO 4 hours before.

## 2018-09-28 ENCOUNTER — Inpatient Hospital Stay: Payer: Medicare HMO

## 2018-09-28 ENCOUNTER — Inpatient Hospital Stay (HOSPITAL_BASED_OUTPATIENT_CLINIC_OR_DEPARTMENT_OTHER): Payer: Medicare HMO | Admitting: Hematology and Oncology

## 2018-09-28 ENCOUNTER — Telehealth: Payer: Self-pay | Admitting: Oncology

## 2018-09-28 ENCOUNTER — Telehealth: Payer: Self-pay | Admitting: Hematology and Oncology

## 2018-09-28 VITALS — BP 130/47 | HR 84 | Temp 97.6°F | Resp 18 | Ht 65.0 in | Wt 177.6 lb

## 2018-09-28 DIAGNOSIS — C786 Secondary malignant neoplasm of retroperitoneum and peritoneum: Secondary | ICD-10-CM

## 2018-09-28 DIAGNOSIS — C569 Malignant neoplasm of unspecified ovary: Secondary | ICD-10-CM

## 2018-09-28 DIAGNOSIS — N179 Acute kidney failure, unspecified: Secondary | ICD-10-CM

## 2018-09-28 DIAGNOSIS — R590 Localized enlarged lymph nodes: Secondary | ICD-10-CM

## 2018-09-28 DIAGNOSIS — Z85118 Personal history of other malignant neoplasm of bronchus and lung: Secondary | ICD-10-CM | POA: Diagnosis not present

## 2018-09-28 DIAGNOSIS — R69 Illness, unspecified: Secondary | ICD-10-CM | POA: Diagnosis not present

## 2018-09-28 DIAGNOSIS — I10 Essential (primary) hypertension: Secondary | ICD-10-CM

## 2018-09-28 DIAGNOSIS — N183 Chronic kidney disease, stage 3 unspecified: Secondary | ICD-10-CM

## 2018-09-28 DIAGNOSIS — I129 Hypertensive chronic kidney disease with stage 1 through stage 4 chronic kidney disease, or unspecified chronic kidney disease: Secondary | ICD-10-CM | POA: Diagnosis not present

## 2018-09-28 DIAGNOSIS — C801 Malignant (primary) neoplasm, unspecified: Secondary | ICD-10-CM | POA: Diagnosis not present

## 2018-09-28 DIAGNOSIS — I959 Hypotension, unspecified: Secondary | ICD-10-CM | POA: Diagnosis not present

## 2018-09-28 DIAGNOSIS — Z7189 Other specified counseling: Secondary | ICD-10-CM

## 2018-09-28 DIAGNOSIS — Z87891 Personal history of nicotine dependence: Secondary | ICD-10-CM

## 2018-09-28 DIAGNOSIS — E119 Type 2 diabetes mellitus without complications: Secondary | ICD-10-CM

## 2018-09-28 MED ORDER — LIDOCAINE-PRILOCAINE 2.5-2.5 % EX CREA: TOPICAL_CREAM | CUTANEOUS | 3 refills | Status: AC

## 2018-09-28 MED ORDER — ONDANSETRON HCL 8 MG PO TABS: 8.0000 mg | ORAL_TABLET | Freq: Three times a day (TID) | ORAL | 1 refills | Status: AC | PRN

## 2018-09-28 MED ORDER — PROCHLORPERAZINE MALEATE 10 MG PO TABS: 10.0000 mg | ORAL_TABLET | Freq: Four times a day (QID) | ORAL | 1 refills | Status: AC | PRN

## 2018-09-28 MED ORDER — DEXAMETHASONE 4 MG PO TABS: ORAL_TABLET | ORAL | 0 refills | Status: DC

## 2018-09-28 NOTE — Telephone Encounter (Signed)
No los °

## 2018-09-28 NOTE — Telephone Encounter (Signed)
Requested MSI, dMMR and NTRK on Accession [id number] with Suanne Marker in [facility].

## 2018-09-29 ENCOUNTER — Encounter: Payer: Self-pay | Admitting: Hematology and Oncology

## 2018-09-29 ENCOUNTER — Other Ambulatory Visit: Payer: Self-pay | Admitting: Hematology and Oncology

## 2018-09-29 ENCOUNTER — Inpatient Hospital Stay: Payer: Medicare HMO

## 2018-09-29 VITALS — BP 123/60 | HR 75 | Temp 97.4°F | Resp 16

## 2018-09-29 DIAGNOSIS — C569 Malignant neoplasm of unspecified ovary: Secondary | ICD-10-CM

## 2018-09-29 DIAGNOSIS — C801 Malignant (primary) neoplasm, unspecified: Secondary | ICD-10-CM | POA: Diagnosis not present

## 2018-09-29 DIAGNOSIS — Z7189 Other specified counseling: Secondary | ICD-10-CM | POA: Insufficient documentation

## 2018-09-29 DIAGNOSIS — N183 Chronic kidney disease, stage 3 unspecified: Secondary | ICD-10-CM | POA: Insufficient documentation

## 2018-09-29 DIAGNOSIS — Z87891 Personal history of nicotine dependence: Secondary | ICD-10-CM | POA: Diagnosis not present

## 2018-09-29 DIAGNOSIS — C786 Secondary malignant neoplasm of retroperitoneum and peritoneum: Secondary | ICD-10-CM | POA: Diagnosis not present

## 2018-09-29 MED ORDER — FAMOTIDINE IN NACL 20-0.9 MG/50ML-% IV SOLN
20.0000 mg | Freq: Once | INTRAVENOUS | Status: AC
  Administered 2018-09-29: 20 mg via INTRAVENOUS

## 2018-09-29 MED ORDER — DIPHENHYDRAMINE HCL 50 MG/ML IJ SOLN
50.0000 mg | Freq: Once | INTRAMUSCULAR | Status: AC
  Administered 2018-09-29: 50 mg via INTRAVENOUS

## 2018-09-29 MED ORDER — HEPARIN SOD (PORK) LOCK FLUSH 100 UNIT/ML IV SOLN
500.0000 [IU] | Freq: Once | INTRAVENOUS | Status: AC | PRN
  Administered 2018-09-29: 500 [IU]
  Filled 2018-09-29: qty 5

## 2018-09-29 MED ORDER — PALONOSETRON HCL INJECTION 0.25 MG/5ML
INTRAVENOUS | Status: AC
  Filled 2018-09-29: qty 5

## 2018-09-29 MED ORDER — SODIUM CHLORIDE 0.9 % IV SOLN
Freq: Once | INTRAVENOUS | Status: AC
  Administered 2018-09-29: 09:00:00 via INTRAVENOUS
  Filled 2018-09-29: qty 5

## 2018-09-29 MED ORDER — FAMOTIDINE IN NACL 20-0.9 MG/50ML-% IV SOLN
INTRAVENOUS | Status: AC
  Filled 2018-09-29: qty 50

## 2018-09-29 MED ORDER — DIPHENHYDRAMINE HCL 50 MG/ML IJ SOLN
INTRAMUSCULAR | Status: AC
  Filled 2018-09-29: qty 1

## 2018-09-29 MED ORDER — SODIUM CHLORIDE 0.9% FLUSH
10.0000 mL | INTRAVENOUS | Status: DC | PRN
  Administered 2018-09-29: 10 mL
  Filled 2018-09-29: qty 10

## 2018-09-29 MED ORDER — SODIUM CHLORIDE 0.9 % IV SOLN
140.0000 mg/m2 | Freq: Once | INTRAVENOUS | Status: AC
  Administered 2018-09-29: 270 mg via INTRAVENOUS
  Filled 2018-09-29: qty 45

## 2018-09-29 MED ORDER — SODIUM CHLORIDE 0.9 % IV SOLN
Freq: Once | INTRAVENOUS | Status: AC
  Administered 2018-09-29: 09:00:00 via INTRAVENOUS
  Filled 2018-09-29: qty 250

## 2018-09-29 MED ORDER — PALONOSETRON HCL INJECTION 0.25 MG/5ML
0.2500 mg | Freq: Once | INTRAVENOUS | Status: AC
  Administered 2018-09-29: 0.25 mg via INTRAVENOUS

## 2018-09-29 MED ORDER — SODIUM CHLORIDE 0.9 % IV SOLN
385.2000 mg | Freq: Once | INTRAVENOUS | Status: AC
  Administered 2018-09-29: 390 mg via INTRAVENOUS
  Filled 2018-09-29: qty 39

## 2018-09-29 NOTE — Assessment & Plan Note (Signed)
Her blood pressure is low.  She is at risk of dehydration and she has evidence of renal failure.  I recommend discontinuation of diuretic therapy

## 2018-09-29 NOTE — Progress Notes (Signed)
[place] OFFICE PROGRESS NOTE  Patient Care Team: Caryl Bis, MD as PCP - General (Unknown Physician Specialty)  ASSESSMENT & PLAN:  Ovarian cancer [facility]) I have reviewed imaging study with the patient and her son The patient has stage IV disease. I am confident that aggressive palliative chemotherapy might improve her quality of life. We reviewed the NCCN guidelines We discussed the role of chemotherapy. The intent is of palliative intent.  We discussed some of the risks, benefits, side-effects of carboplatin & Taxol. Treatment is intravenous, every 3 weeks x 6 cycles  Some of the short term side-effects included, though not limited to, including weight loss, life threatening infections, risk of allergic reactions, need for transfusions of blood products, nausea, vomiting, change in bowel habits, loss of hair, admission to hospital for various reasons, and risks of death.   Long term side-effects are also discussed including risks of infertility, permanent damage to nerve function, hearing loss, chronic fatigue, kidney damage with possibility needing hemodialysis, and rare secondary malignancy including bone marrow disorders.  The patient is aware that the response rates discussed earlier is not guaranteed.  After a long discussion, patient made an informed decision to proceed with the prescribed plan of care.   Patient education material was dispensed. We discussed premedication with dexamethasone before chemotherapy. I do not plan to prescribe prophylactic G-CSF support I plan to reduce the dose of Taxol a little bit due to palliative nature of treatment and will adjust carboplatin according to her renal function. I recommend minimum 3 cycles of treatment before repeat imaging study. I recommend genetics referral.  History of lung cancer CT scan of the chest revealed lymphadenopathy but I do not believe they represent recurrence of lung cancer.  I plan to repeat  CT imaging after 3 cycles of therapy to follow  Essential hypertension Her blood pressure is low.  She is at risk of dehydration and she has evidence of renal failure.  I recommend discontinuation of diuretic therapy  Diabetes mellitus without complication ([place]) We discussed dietary modification while on treatment.  She is at risk of hyperglycemia due to background history of diabetes and steroid use.  We will monitor her blood sugar carefully while on treatment  CKD (chronic kidney disease), stage III ([place]) She has mild acute on chronic renal failure I have discontinued her diuretic therapy and encouraged her to increase fluid as tolerated I plan to adjust the chemotherapy dose a little bit and will monitor carefully  Goals of care, counseling/discussion The patient is aware she has incurable disease and treatment is strictly palliative. We discussed importance of Advanced Directives and Living will. Her son, Shanon Brow, is her healthcare power of attorney    Orders Placed This Encounter  Procedures  . Ambulatory referral to Genetics    Referral Priority:   Routine    Referral Type:   Consultation    Referral Reason:   Specialty Services Required    Number of Visits Requested:   1    INTERVAL HISTORY: Please see below for problem oriented charting. She returns with her son for further follow-up after attending chemotherapy class, to review CT imaging and final recommendation before she proceed with therapy. She feels bloated.  She continues to have abdominal discomfort and distention.  She denies significant nausea.  Her bowels are moving fine with aggressive laxative therapy. She tolerated port placement well  SUMMARY OF ONCOLOGIC HISTORY:   Ovarian cancer ([place])   08/25/2018 Initial Diagnosis  Initially presented with nausea, vomiting and abdominal distension    08/25/2018 Imaging    CT scan demonstrated right pleural thickening and a cardiophrenic lymph node measuring 1.5  cm.  The liver demonstrated mild nodularity and scalloping of the right lobe.  There were 2 less than 1 cm lesions seen within the liver.  There was large volume ascites and a 13 cm upper abdominal left upper quadrant omental cake visualized.  There was extensive peritoneal thickening along the abdomen and pelvic peritoneum.  There is a 1.5 cm aortocaval lymph node identified.  No pelvic adenopathy was identified.  In the pelvis the left adnexa revealed a 4.6 x 3.2 cm ill-defined solid and cystic structure.  No other large dominant pelvic masses identified.  A moderate hiatal hernia was seen and a small umbilical hernia containing fat was seen.    08/29/2018 Tumor Marker    Patient's tumor was tested for the following markers: CA-125. Results of the tumor marker test revealed 1060    09/15/2018 Cancer Staging    Staging form: Ovary, Fallopian Tube, and Primary Peritoneal Carcinoma, AJCC 8th Edition - Clinical: Stage IV (cT3c, cN1, cM1) - Signed by Heath Lark, MD on 09/15/2018    09/15/2018 Tumor Marker    Patient's tumor was tested for the following markers: CA-125. Results of the tumor marker test revealed 1491    09/25/2018 Pathology Results    Omentum, biopsy - HIGH GRADE CARCINOMA, CONSISTENT WITH PAPILLARY SEROUS CARCINOMA. - SEE COMMENT. Microscopic Comment The malignant cells are positive for cytokeratin 7, estrogen receptor, p53, pax-8, and wt-1. They are negative for cytokeratin 20 and CDX-2. The histology, in conjunction with this immunohistochemical profile, supports the above diagnosis. Dr. Vicente Males has reviewed the case and concurs with this interpretation.     09/25/2018 Procedure    Successful 8 French right internal jugular vein power port placement with its tip at the SVC/RA junction.     09/26/2018 Imaging    1. Examination is positive for nodal metastasis within the anterior and posterior mediastinum as well as right CP angle. 2. There is a small amount of right pleural  fluid and pleural soft tissue nodularity which may reflect pleural involvement by tumor. 3. Large volume of ascites with evidence of peritoneal carcinomatosis with omental caking. 4. Aortic Atherosclerosis (ICD10-I70.0) and Emphysema (ICD10-J43.9). 5. Multi vessel coronary artery atherosclerotic calcifications including left main disease     REVIEW OF SYSTEMS:   Constitutional: Denies fevers, chills or abnormal weight loss Eyes: Denies blurriness of vision Ears, nose, mouth, throat, and face: Denies mucositis or sore throat Respiratory: Denies cough, dyspnea or wheezes Cardiovascular: Denies palpitation, chest discomfort or lower extremity swelling Skin: Denies abnormal skin rashes Lymphatics: Denies new lymphadenopathy or easy bruising Neurological:Denies numbness, tingling or new weaknesses Behavioral/Psych: Mood is stable, no new changes  All other systems were reviewed with the patient and are negative.  I have reviewed the past medical history, past surgical history, social history and family history with the patient and they are unchanged from previous note.  ALLERGIES:  has No Known Allergies.  MEDICATIONS:  Current Outpatient Medications  Medication Sig Dispense Refill  . allopurinol (ZYLOPRIM) 300 MG tablet Take 300 mg by mouth daily.    Marland Kitchen aspirin EC 81 MG tablet Take 81 mg by mouth daily.    Marland Kitchen atorvastatin (LIPITOR) 80 MG tablet Take 80 mg by mouth daily.    . benazepril (LOTENSIN) 20 MG tablet Take 20 mg by mouth daily.    Marland Kitchen  Cholecalciferol (VITAMIN D3 PO) Take 1,000 Units by mouth daily.    . cloNIDine (CATAPRES) 0.2 MG tablet Take 0.2 mg by mouth 2 (two) times daily.    Marland Kitchen dexamethasone (DECADRON) 4 MG tablet Take 2 tabs at the night before and 2 tab the morning of chemotherapy, every 3 weeks, by mouth 24 tablet 0  . diltiazem (CARDIZEM CD) 300 MG 24 hr capsule Take 300 mg by mouth daily.    Marland Kitchen levothyroxine (SYNTHROID, LEVOTHROID) 100 MCG tablet Take 100 mcg by mouth  daily before breakfast.    . lidocaine-prilocaine (EMLA) cream Apply to affected area once 30 g 3  . metFORMIN (GLUCOPHAGE-XR) 500 MG 24 hr tablet Take 2,000 mg by mouth daily with breakfast.    . ondansetron (ZOFRAN) 8 MG tablet Take 1 tablet (8 mg total) by mouth every 8 (eight) hours as needed for refractory nausea / vomiting. Start on day 3 after chemo. 30 tablet 1  . prochlorperazine (COMPAZINE) 10 MG tablet Take 1 tablet (10 mg total) by mouth every 6 (six) hours as needed (Nausea or vomiting). 30 tablet 1   No current facility-administered medications for this visit.    Facility-Administered Medications Ordered in Other Visits  Medication Dose Route Frequency Provider Last Rate Last Dose  . CARBOplatin (PARAPLATIN) 390 mg in sodium chloride 0.9 % 250 mL chemo infusion  390 mg Intravenous Once [name], Isahi Godwin, MD      . heparin lock flush 100 unit/mL  500 Units Intracatheter Once PRN [name], Teeghan Hammer, MD      . PACLitaxel (TAXOL) 270 mg in sodium chloride 0.9 % 250 mL chemo infusion (> 67m/m2)  140 mg/m2 (Treatment Plan Recorded) Intravenous Once Emanuel Dowson, MD      . sodium chloride flush (NS) 0.9 % injection 10 mL  10 mL Intracatheter PRN GAlvy Bimler Evanna Washinton, MD        PHYSICAL EXAMINATION: ECOG PERFORMANCE STATUS: 1 - Symptomatic but completely ambulatory  Vitals:   09/28/18 1006  BP: (!) 130/47  Pulse: 84  Resp: 18  Temp: 97.6 F (36.4 C)  SpO2: 100%   Filed Weights   09/28/18 1006  Weight: 177 lb 9.6 oz (80.6 kg)    GENERAL:alert, no distress and comfortable SKIN: skin color, texture, turgor are normal, no rashes or significant lesions EYES: normal, Conjunctiva are pink and non-injected, sclera clear OROPHARYNX:no exudate, no erythema and lips, buccal mucosa, and tongue normal  NECK: supple, thyroid normal size, non-tender, without nodularity LYMPH:  no palpable lymphadenopathy in the cervical, axillary or inguinal LUNGS: clear to auscultation and percussion with normal breathing  effort HEART: regular rate & rhythm and no murmurs and no lower extremity edema ABDOMEN:abdomen soft, distended.  Nontender Musculoskeletal:no cyanosis of digits and no clubbing  NEURO: alert & oriented x 3 with fluent speech, no focal motor/sensory deficits  LABORATORY DATA:  I have reviewed the data as listed    Component Value Date/Time   NA 135 09/25/2018 1148   K 3.7 09/25/2018 1148   CL 103 09/25/2018 1148   CO2 21 (L) 09/25/2018 1148   GLUCOSE 94 09/25/2018 1148   BUN 25 (H) 09/25/2018 1148   CREATININE 1.74 (H) 09/25/2018 1148   CREATININE 1.58 (H) 09/15/2018 1232   CALCIUM 9.0 09/25/2018 1148   PROT 7.5 09/25/2018 1148   ALBUMIN 3.6 09/25/2018 1148   AST 38 09/25/2018 1148   AST 20 09/15/2018 1232   ALT 25 09/25/2018 1148   ALT 15 09/15/2018 1232   ALKPHOS 91  09/25/2018 1148   BILITOT 0.8 09/25/2018 1148   BILITOT 0.3 09/15/2018 1232   GFRNONAA 30 (L) 09/25/2018 1148   GFRNONAA 33 (L) 09/15/2018 1232   GFRAA 34 (L) 09/25/2018 1148   GFRAA 39 (L) 09/15/2018 1232    No results found for: SPEP, UPEP  Lab Results  Component Value Date   WBC 5.6 09/25/2018   NEUTROABS 3.5 09/25/2018   HGB 9.2 (L) 09/25/2018   HCT 30.3 (L) 09/25/2018   MCV 86.6 09/25/2018   PLT 370 09/25/2018      Chemistry      Component Value Date/Time   NA 135 09/25/2018 1148   K 3.7 09/25/2018 1148   CL 103 09/25/2018 1148   CO2 21 (L) 09/25/2018 1148   BUN 25 (H) 09/25/2018 1148   CREATININE 1.74 (H) 09/25/2018 1148   CREATININE 1.58 (H) 09/15/2018 1232      Component Value Date/Time   CALCIUM 9.0 09/25/2018 1148   ALKPHOS 91 09/25/2018 1148   AST 38 09/25/2018 1148   AST 20 09/15/2018 1232   ALT 25 09/25/2018 1148   ALT 15 09/15/2018 1232   BILITOT 0.8 09/25/2018 1148   BILITOT 0.3 09/15/2018 1232       RADIOGRAPHIC STUDIES: I have reviewed all CT imaging with the patient and family I have personally reviewed the radiological images as listed and agreed with the findings  in the report. Ct Chest W Contrast  Result Date: 09/26/2018 CLINICAL DATA:  Lung cancer follow-up. New diagnosis of ovarian carcinoma with peritoneal disease. EXAM: CT CHEST WITH CONTRAST TECHNIQUE: Multidetector CT imaging of the chest was performed during intravenous contrast administration. CONTRAST:  60m OMNIPAQUE IOHEXOL 300 MG/ML  SOLN COMPARISON:  10/21/2010 FINDINGS: Cardiovascular: The heart size appears normal. Trace pericardial fluid noted. Aortic atherosclerosis. Calcification in the LAD, left circumflex, RCA and left main coronary arteries noted. Mediastinum/Nodes: Normal appearance of the thyroid gland. The trachea appears patent and is midline. Moderate size hiatal hernia. Enlarged mediastinal lymph nodes are new when compared with the previous: -Index lymph node within the anterior mediastinum measures 1.4 cm, image 47/2. -Index anterior mediastinal lymph node measures 1.4 cm, image 39/2. -Large right posterior mediastinal lymph node measures 2.7 cm, image 92/2. -right CP angle lymph node measures 1.4 cm, image 103/2. Lungs/Pleura: Moderate changes of centrilobular emphysema. There are postoperative changes from right lower lobectomy. There is mild increased soft tissue and fluid within the right posterior costophrenic sulcus. Here too soft tissue nodules measure up to 3.3 cm, image 99/2. Smaller small pleural based nodules overlying the right lung base are noted which appear more conspicuous than on previous exam and suspicious for pleural disease. Upper Abdomen: There is a large volume of ascites with marked distension of the abdomen, new from previous exam. There is peritoneal nodularity and evidence of omental caking, image 147/2. Musculoskeletal: Postoperative changes involving the posterior aspect of right seventh and eighth ribs noted. No aggressive lytic or sclerotic bone lesions. IMPRESSION: 1. Examination is positive for nodal metastasis within the anterior and posterior mediastinum as  well as right CP angle. 2. There is a small amount of right pleural fluid and pleural soft tissue nodularity which may reflect pleural involvement by tumor. 3. Large volume of ascites with evidence of peritoneal carcinomatosis with omental caking. 4. Aortic Atherosclerosis (ICD10-I70.0) and Emphysema (ICD10-J43.9). 5. Multi vessel coronary artery atherosclerotic calcifications including left main disease. Electronically Signed   By: TKerby MoorsM.D.   On: 09/26/2018 17:45   Ir  US Guide Bx Asp/drain  Result Date: 09/25/2018 INDICATION: Peritoneal carcinomatosis is suspected.  Umbilical lesion. EXAM: ULTRASOUND-GUIDED CORE BIOPSY OF AN UMBILICAL MASS MEDICATIONS: None. ANESTHESIA/SEDATION: Moderate (conscious) sedation was employed during this procedure. A total of Versed 1 mg and Fentanyl 0 mcg was administered intravenously. Moderate Sedation Time: Not applicable minutes. The patient's level of consciousness and vital signs were monitored continuously by radiology nursing throughout the procedure under my direct supervision. FLUOROSCOPY TIME:  None COMPLICATIONS: None immediate. PROCEDURE: Informed written consent was obtained from the patient after a thorough discussion of the procedural risks, benefits and alternatives. All questions were addressed. Maximal Sterile Barrier Technique was utilized including caps, mask, sterile gowns, sterile gloves, sterile drape, hand hygiene and skin antiseptic. A timeout was performed prior to the initiation of the procedure. The abdomen was prepped and draped in a sterile fashion. 1% lidocaine was utilized for local anesthesia. Under sonographic guidance, 2 18 gauge core biopsies of the omental mass were obtained. FINDINGS: Images document needle placement in the umbilical mass. Post biopsy sonographic imaging demonstrates no evidence of hemorrhage. IMPRESSION: Successful core biopsy of an umbilical mass. Electronically Signed   By: [name] M.D.   On: 09/25/2018  14:25   Ir Imaging Guided Port Insertion  Result Date: 09/25/2018 CLINICAL DATA:  Abdominal cancer EXAM: TUNNEL POWER PORT PLACEMENT WITH SUBCUTANEOUS POCKET UTILIZING ULTRASOUND & FLOUROSCOPY FLUOROSCOPY TIME:  36 seconds.  Six mGy. MEDICATIONS AND MEDICAL HISTORY: Versed 4 mg, Fentanyl 100 mcg. Additional Medications: 2 g Ancef. Antibiotics were given within 2 hours of the procedure. ANESTHESIA/SEDATION: Moderate sedation time: 28 minutes. Nursing monitored the the patient during the procedure. PROCEDURE: After written informed consent was obtained, patient was placed in the supine position on angiographic table. The right neck and chest was prepped and draped in a sterile fashion. Lidocaine was utilized for local anesthesia. The right jugular vein was noted to be patent initially with ultrasound. Under sonographic guidance, a micropuncture needle was inserted into the right IJ vein (Ultrasound and fluoroscopic image documentation was performed). The needle was removed over an 018 wire which was exchanged for a Amplatz. This was advanced into the IVC. An 8-French dilator was advanced over the Amplatz. A small incision was made in the right upper chest over the anterior right second rib. Utilizing blunt dissection, a subcutaneous pocket was created in the caudal direction. The pocket was irrigated with a copious amount of sterile normal saline. The port catheter was tunneled from the chest incision, and out the neck incision. The reservoir was inserted into the subcutaneous pocket and secured with two 3-0 Ethilon stitches. A peel-away sheath was advanced over the Amplatz wire. The port catheter was cut to measure length and inserted through the peel-away sheath. The peel-away sheath was removed. The chest incision was closed with 3-0 Vicryl interrupted stitches for the subcutaneous tissue and a running of 4-0 Vicryl subcuticular stitch for the skin. The neck incision was closed with a 4-0 Vicryl subcuticular  stitch. Derma-bond was applied to both surgical incisions. The port reservoir was flushed and instilled with heparinized saline. No complications. FINDINGS: A right IJ vein Port-A-Cath is in place with its tip at the cavoatrial junction. COMPLICATIONS: None IMPRESSION: Successful 8 French right internal jugular vein power port placement with its tip at the SVC/RA junction. Electronically Signed   By: [name] M.D.   On: 09/25/2018 14:20    All questions were answered. The patient knows to call the clinic with any problems, questions  or concerns. No barriers to learning was detected.  I spent 40 minutes counseling the patient face to face. The total time spent in the appointment was 55 minutes and more than 50% was on counseling and review of test results  Heath Lark, MD 09/29/2018 10:25 AM

## 2018-09-29 NOTE — Assessment & Plan Note (Signed)
CT scan of the chest revealed lymphadenopathy but I do not believe they represent recurrence of lung cancer.  I plan to repeat CT imaging after 3 cycles of therapy to follow

## 2018-09-29 NOTE — Patient Instructions (Signed)
[facility] Discharge Instructions for Patients Receiving Chemotherapy  Today you received the following chemotherapy agents Taxol, Carbo  To help prevent nausea and vomiting after your treatment, we encourage you to take your nausea medication as directed   If you develop nausea and vomiting that is not controlled by your nausea medication, call the clinic.   BELOW ARE SYMPTOMS THAT SHOULD BE REPORTED IMMEDIATELY:  *FEVER GREATER THAN 100.5 F  *CHILLS WITH OR WITHOUT FEVER  NAUSEA AND VOMITING THAT IS NOT CONTROLLED WITH YOUR NAUSEA MEDICATION  *UNUSUAL SHORTNESS OF BREATH  *UNUSUAL BRUISING OR BLEEDING  TENDERNESS IN MOUTH AND THROAT WITH OR WITHOUT PRESENCE OF ULCERS  *URINARY PROBLEMS  *BOWEL PROBLEMS  UNUSUAL RASH Items with * indicate a potential emergency and should be followed up as soon as possible.  Feel free to call the clinic should you have any questions or concerns. The clinic phone number is [phone].  Please show the [facility] at check-in to the Emergency Department and triage nurse.  Taxol-Paclitaxel injection What is this medicine? PACLITAXEL (PAK li TAX el) is a chemotherapy drug. It targets fast dividing cells, like cancer cells, and causes these cells to die. This medicine is used to treat ovarian cancer, breast cancer, lung cancer, Kaposi's sarcoma, and other cancers. This medicine may be used for other purposes; ask your health care provider or pharmacist if you have questions. COMMON BRAND NAME(S): Onxol, Taxol What should I tell my health care provider before I take this medicine? They need to know if you have any of these conditions: -history of irregular heartbeat -liver disease -low blood counts, like low white cell, platelet, or red cell counts -lung or breathing disease, like asthma -tingling of the fingers or toes, or other nerve disorder -an unusual or allergic reaction to paclitaxel, alcohol, polyoxyethylated  castor oil, other chemotherapy, other medicines, foods, dyes, or preservatives -pregnant or trying to get pregnant -breast-feeding How should I use this medicine? This drug is given as an infusion into a vein. It is administered in a hospital or clinic by a specially trained health care professional. Talk to your pediatrician regarding the use of this medicine in children. Special care may be needed. Overdosage: If you think you have taken too much of this medicine contact a poison control center or emergency room at once. NOTE: This medicine is only for you. Do not share this medicine with others. What if I miss a dose? It is important not to miss your dose. Call your doctor or health care professional if you are unable to keep an appointment. What may interact with this medicine? Do not take this medicine with any of the following medications: -disulfiram -metronidazole This medicine may also interact with the following medications: -antiviral medicines for hepatitis, HIV or AIDS -certain antibiotics like erythromycin and clarithromycin -certain medicines for fungal infections like ketoconazole and itraconazole -certain medicines for seizures like carbamazepine, phenobarbital, phenytoin -gemfibrozil -nefazodone -rifampin -St. John's wort This list may not describe all possible interactions. Give your health care provider a list of all the medicines, herbs, non-prescription drugs, or dietary supplements you use. Also tell them if you smoke, drink alcohol, or use illegal drugs. Some items may interact with your medicine. What should I watch for while using this medicine? Your condition will be monitored carefully while you are receiving this medicine. You will need important blood work done while you are taking this medicine. This medicine can cause serious allergic reactions. To reduce your  risk you will need to take other medicine(s) before treatment with this medicine. If you experience  allergic reactions like skin rash, itching or hives, swelling of the face, lips, or tongue, tell your doctor or health care professional right away. In some cases, you may be given additional medicines to help with side effects. Follow all directions for their use. This drug may make you feel generally unwell. This is not uncommon, as chemotherapy can affect healthy cells as well as cancer cells. Report any side effects. Continue your course of treatment even though you feel ill unless your doctor tells you to stop. Call your doctor or health care professional for advice if you get a fever, chills or sore throat, or other symptoms of a cold or flu. Do not treat yourself. This drug decreases your body's ability to fight infections. Try to avoid being around people who are sick. This medicine may increase your risk to bruise or bleed. Call your doctor or health care professional if you notice any unusual bleeding. Be careful brushing and flossing your teeth or using a toothpick because you may get an infection or bleed more easily. If you have any dental work done, tell your dentist you are receiving this medicine. Avoid taking products that contain aspirin, acetaminophen, ibuprofen, naproxen, or ketoprofen unless instructed by your doctor. These medicines may hide a fever. Do not become pregnant while taking this medicine. Women should inform their doctor if they wish to become pregnant or think they might be pregnant. There is a potential for serious side effects to an unborn child. Talk to your health care professional or pharmacist for more information. Do not breast-feed an infant while taking this medicine. Men are advised not to father a child while receiving this medicine. This product may contain alcohol. Ask your pharmacist or healthcare provider if this medicine contains alcohol. Be sure to tell all healthcare providers you are taking this medicine. Certain medicines, like metronidazole and  disulfiram, can cause an unpleasant reaction when taken with alcohol. The reaction includes flushing, headache, nausea, vomiting, sweating, and increased thirst. The reaction can last from 30 minutes to several hours. What side effects may I notice from receiving this medicine? Side effects that you should report to your doctor or health care professional as soon as possible: -allergic reactions like skin rash, itching or hives, swelling of the face, lips, or tongue -breathing problems -changes in vision -fast, irregular heartbeat -high or low blood pressure -mouth sores -pain, tingling, numbness in the hands or feet -signs of decreased platelets or bleeding - bruising, pinpoint red spots on the skin, black, tarry stools, blood in the urine -signs of decreased red blood cells - unusually weak or tired, feeling faint or lightheaded, falls -signs of infection - fever or chills, cough, sore throat, pain or difficulty passing urine -signs and symptoms of liver injury like dark yellow or brown urine; general ill feeling or flu-like symptoms; light-colored stools; loss of appetite; nausea; right upper belly pain; unusually weak or tired; yellowing of the eyes or skin -swelling of the ankles, feet, hands -unusually slow heartbeat Side effects that usually do not require medical attention (report to your doctor or health care professional if they continue or are bothersome): -diarrhea -hair loss -loss of appetite -muscle or joint pain -nausea, vomiting -pain, redness, or irritation at site where injected -tiredness This list may not describe all possible side effects. Call your doctor for medical advice about side effects. You may report side effects to  FDA at 1-800-FDA-1088. Where should I keep my medicine? This drug is given in a hospital or clinic and will not be stored at home. NOTE: This sheet is a summary. It may not cover all possible information. If you have questions about this medicine,  talk to your doctor, pharmacist, or health care provider.  2019 Elsevier/Gold Standard (2017-04-26 13:14:55)  Carbo-Carboplatin injection What is this medicine? CARBOPLATIN (KAR boe pla tin) is a chemotherapy drug. It targets fast dividing cells, like cancer cells, and causes these cells to die. This medicine is used to treat ovarian cancer and many other cancers. This medicine may be used for other purposes; ask your health care provider or pharmacist if you have questions. COMMON BRAND NAME(S): Paraplatin What should I tell my health care provider before I take this medicine? They need to know if you have any of these conditions: -blood disorders -hearing problems -kidney disease -recent or ongoing radiation therapy -an unusual or allergic reaction to carboplatin, cisplatin, other chemotherapy, other medicines, foods, dyes, or preservatives -pregnant or trying to get pregnant -breast-feeding How should I use this medicine? This drug is usually given as an infusion into a vein. It is administered in a hospital or clinic by a specially trained health care professional. Talk to your pediatrician regarding the use of this medicine in children. Special care may be needed. Overdosage: If you think you have taken too much of this medicine contact a poison control center or emergency room at once. NOTE: This medicine is only for you. Do not share this medicine with others. What if I miss a dose? It is important not to miss a dose. Call your doctor or health care professional if you are unable to keep an appointment. What may interact with this medicine? -medicines for seizures -medicines to increase blood counts like filgrastim, pegfilgrastim, sargramostim -some antibiotics like amikacin, gentamicin, neomycin, streptomycin, tobramycin -vaccines Talk to your doctor or health care professional before taking any of these  medicines: -acetaminophen -aspirin -ibuprofen -ketoprofen -naproxen This list may not describe all possible interactions. Give your health care provider a list of all the medicines, herbs, non-prescription drugs, or dietary supplements you use. Also tell them if you smoke, drink alcohol, or use illegal drugs. Some items may interact with your medicine. What should I watch for while using this medicine? Your condition will be monitored carefully while you are receiving this medicine. You will need important blood work done while you are taking this medicine. This drug may make you feel generally unwell. This is not uncommon, as chemotherapy can affect healthy cells as well as cancer cells. Report any side effects. Continue your course of treatment even though you feel ill unless your doctor tells you to stop. In some cases, you may be given additional medicines to help with side effects. Follow all directions for their use. Call your doctor or health care professional for advice if you get a fever, chills or sore throat, or other symptoms of a cold or flu. Do not treat yourself. This drug decreases your body's ability to fight infections. Try to avoid being around people who are sick. This medicine may increase your risk to bruise or bleed. Call your doctor or health care professional if you notice any unusual bleeding. Be careful brushing and flossing your teeth or using a toothpick because you may get an infection or bleed more easily. If you have any dental work done, tell your dentist you are receiving this medicine. Avoid taking products that  contain aspirin, acetaminophen, ibuprofen, naproxen, or ketoprofen unless instructed by your doctor. These medicines may hide a fever. Do not become pregnant while taking this medicine. Women should inform their doctor if they wish to become pregnant or think they might be pregnant. There is a potential for serious side effects to an unborn child. Talk to  your health care professional or pharmacist for more information. Do not breast-feed an infant while taking this medicine. What side effects may I notice from receiving this medicine? Side effects that you should report to your doctor or health care professional as soon as possible: -allergic reactions like skin rash, itching or hives, swelling of the face, lips, or tongue -signs of infection - fever or chills, cough, sore throat, pain or difficulty passing urine -signs of decreased platelets or bleeding - bruising, pinpoint red spots on the skin, black, tarry stools, nosebleeds -signs of decreased red blood cells - unusually weak or tired, fainting spells, lightheadedness -breathing problems -changes in hearing -changes in vision -chest pain -high blood pressure -low blood counts - This drug may decrease the number of white blood cells, red blood cells and platelets. You may be at increased risk for infections and bleeding. -nausea and vomiting -pain, swelling, redness or irritation at the injection site -pain, tingling, numbness in the hands or feet -problems with balance, talking, walking -trouble passing urine or change in the amount of urine Side effects that usually do not require medical attention (report to your doctor or health care professional if they continue or are bothersome): -hair loss -loss of appetite -metallic taste in the mouth or changes in taste This list may not describe all possible side effects. Call your doctor for medical advice about side effects. You may report side effects to FDA at 1-800-FDA-1088. Where should I keep my medicine? This drug is given in a hospital or clinic and will not be stored at home. NOTE: This sheet is a summary. It may not cover all possible information. If you have questions about this medicine, talk to your doctor, pharmacist, or health care provider.  2019 Elsevier/Gold Standard (2007-11-28 14:38:05)

## 2018-09-29 NOTE — Assessment & Plan Note (Addendum)
I have reviewed imaging study with the patient and her son The patient has stage IV disease. I am confident that aggressive palliative chemotherapy might improve her quality of life. We reviewed the NCCN guidelines We discussed the role of chemotherapy. The intent is of palliative intent.  We discussed some of the risks, benefits, side-effects of carboplatin & Taxol. Treatment is intravenous, every 3 weeks x 6 cycles  Some of the short term side-effects included, though not limited to, including weight loss, life threatening infections, risk of allergic reactions, need for transfusions of blood products, nausea, vomiting, change in bowel habits, loss of hair, admission to hospital for various reasons, and risks of death.   Long term side-effects are also discussed including risks of infertility, permanent damage to nerve function, hearing loss, chronic fatigue, kidney damage with possibility needing hemodialysis, and rare secondary malignancy including bone marrow disorders.  The patient is aware that the response rates discussed earlier is not guaranteed.  After a long discussion, patient made an informed decision to proceed with the prescribed plan of care.   Patient education material was dispensed. We discussed premedication with dexamethasone before chemotherapy. I do not plan to prescribe prophylactic G-CSF support I plan to reduce the dose of Taxol a little bit due to palliative nature of treatment and will adjust carboplatin according to her renal function. I recommend minimum 3 cycles of treatment before repeat imaging study. I recommend genetics referral.

## 2018-09-29 NOTE — Assessment & Plan Note (Signed)
She has mild acute on chronic renal failure I have discontinued her diuretic therapy and encouraged her to increase fluid as tolerated I plan to adjust the chemotherapy dose a little bit and will monitor carefully

## 2018-09-29 NOTE — Progress Notes (Signed)
Went to infusion to introduce myself to patient as Arboriculturist and to offer available resources.  Discussed one-time $1000 Radio broadcast assistant to assist with personal expenses while going through treatment. Advised and wrote down what is needed to apply.  Discussed available copay assistance for her diagnosis through PAF and qualifications as well. Wrote down what was needed to apply.  Gave an application for the [employer] for cancer patients whom live in [place] only. Advised she may complete if interested and bring supporting documents along with application for me to make copies and submit.  Gave my card for any additional financial questions or concerns.

## 2018-09-29 NOTE — Assessment & Plan Note (Signed)
We discussed dietary modification while on treatment.  She is at risk of hyperglycemia due to background history of diabetes and steroid use.  We will monitor her blood sugar carefully while on treatment

## 2018-09-29 NOTE — Assessment & Plan Note (Signed)
The patient is aware she has incurable disease and treatment is strictly palliative. We discussed importance of Advanced Directives and Living will. Her son, Shanon Brow, is her healthcare power of attorney

## 2018-10-02 ENCOUNTER — Telehealth: Payer: Self-pay | Admitting: *Deleted

## 2018-10-02 NOTE — Telephone Encounter (Signed)
-----   Message from Belva Chimes, RN sent at 09/29/2018  4:45 PM EST ----- Regarding: First time [name] First time Taxol, Carbo  Patient tolerated treatment well

## 2018-10-02 NOTE — Telephone Encounter (Signed)
Attempted call to back to follow up with her after her 1 st Taxol treatment on 09/29/18. No answer but was able to leave brief vm for patient to call back at her convenience

## 2018-10-05 ENCOUNTER — Telehealth: Payer: Self-pay | Admitting: Hematology and Oncology

## 2018-10-05 DIAGNOSIS — E1122 Type 2 diabetes mellitus with diabetic chronic kidney disease: Secondary | ICD-10-CM | POA: Diagnosis not present

## 2018-10-05 DIAGNOSIS — E782 Mixed hyperlipidemia: Secondary | ICD-10-CM | POA: Diagnosis not present

## 2018-10-05 NOTE — Telephone Encounter (Signed)
Scheduled appt per 1/24 sch message - unable to reach patient - left message with appt date and time for genetics.

## 2018-10-19 ENCOUNTER — Encounter: Payer: Self-pay | Admitting: Licensed Clinical Social Worker

## 2018-10-19 ENCOUNTER — Inpatient Hospital Stay: Payer: Medicare HMO | Attending: Gynecologic Oncology | Admitting: Licensed Clinical Social Worker

## 2018-10-19 DIAGNOSIS — Z5111 Encounter for antineoplastic chemotherapy: Secondary | ICD-10-CM | POA: Insufficient documentation

## 2018-10-19 DIAGNOSIS — D630 Anemia in neoplastic disease: Secondary | ICD-10-CM | POA: Insufficient documentation

## 2018-10-19 DIAGNOSIS — C569 Malignant neoplasm of unspecified ovary: Secondary | ICD-10-CM

## 2018-10-19 DIAGNOSIS — Z800 Family history of malignant neoplasm of digestive organs: Secondary | ICD-10-CM | POA: Diagnosis not present

## 2018-10-19 DIAGNOSIS — Z7984 Long term (current) use of oral hypoglycemic drugs: Secondary | ICD-10-CM | POA: Insufficient documentation

## 2018-10-19 DIAGNOSIS — Z7982 Long term (current) use of aspirin: Secondary | ICD-10-CM | POA: Insufficient documentation

## 2018-10-19 DIAGNOSIS — E119 Type 2 diabetes mellitus without complications: Secondary | ICD-10-CM | POA: Insufficient documentation

## 2018-10-19 DIAGNOSIS — R180 Malignant ascites: Secondary | ICD-10-CM | POA: Insufficient documentation

## 2018-10-19 DIAGNOSIS — C786 Secondary malignant neoplasm of retroperitoneum and peritoneum: Secondary | ICD-10-CM | POA: Insufficient documentation

## 2018-10-19 DIAGNOSIS — N183 Chronic kidney disease, stage 3 (moderate): Secondary | ICD-10-CM | POA: Insufficient documentation

## 2018-10-19 DIAGNOSIS — Z79899 Other long term (current) drug therapy: Secondary | ICD-10-CM | POA: Insufficient documentation

## 2018-10-19 NOTE — Progress Notes (Signed)
REFERRING PROVIDER: Heath Lark, MD [place] [place], [place] [address]  PRIMARY PROVIDER:  Caryl Bis, MD  PRIMARY REASON FOR VISIT:  1. Malignant neoplasm of ovary, unspecified laterality ([place])    HISTORY OF PRESENT ILLNESS:   Christy Ellis, a 68 y.o. female, was seen for a [facility] cancer genetics consultation at the request of Dr. [name] due to her recent diagnosis of ovarian cancer.  Christy Ellis presents to clinic today to discuss the possibility of a hereditary predisposition to cancer, genetic testing, and to further clarify her future cancer risks, as well as potential cancer risks for family members.   In 2020, at the age of 46, Christy Ellis was diagnosed with ovarian cancer. This is currently being treated with chemotherapy.   CANCER HISTORY:  Oncology History   MMR normal     Ovarian cancer ([place])   08/25/2018 Initial Diagnosis    Initially presented with nausea, vomiting and abdominal distension    08/25/2018 Imaging    CT scan demonstrated right pleural thickening and a cardiophrenic lymph node measuring 1.5 cm.  The liver demonstrated mild nodularity and scalloping of the right lobe.  There were 2 less than 1 cm lesions seen within the liver.  There was large volume ascites and a 13 cm upper abdominal left upper quadrant omental cake visualized.  There was extensive peritoneal thickening along the abdomen and pelvic peritoneum.  There is a 1.5 cm aortocaval lymph node identified.  No pelvic adenopathy was identified.  In the pelvis the left adnexa revealed a 4.6 x 3.2 cm ill-defined solid and cystic structure.  No other large dominant pelvic masses identified.  A moderate hiatal hernia was seen and a small umbilical hernia containing fat was seen.    08/29/2018 Tumor Marker    Patient's tumor was tested for the following markers: CA-125. Results of the tumor marker test revealed 1060    09/15/2018 Cancer Staging    Staging form: Ovary, Fallopian Tube, and  Primary Peritoneal Carcinoma, AJCC 8th Edition - Clinical: Stage IV (cT3c, cN1, cM1) - Signed by Heath Lark, MD on 09/15/2018    09/15/2018 Tumor Marker    Patient's tumor was tested for the following markers: CA-125. Results of the tumor marker test revealed 1491    09/25/2018 Pathology Results    Omentum, biopsy - HIGH GRADE CARCINOMA, CONSISTENT WITH PAPILLARY SEROUS CARCINOMA. - SEE COMMENT. Microscopic Comment The malignant cells are positive for cytokeratin 7, estrogen receptor, p53, pax-8, and wt-1. They are negative for cytokeratin 20 and CDX-2. The histology, in conjunction with this immunohistochemical profile, supports the above diagnosis. Dr. Vicente Males has reviewed the case and concurs with this interpretation.     09/25/2018 Procedure    Successful 8 French right internal jugular vein power port placement with its tip at the SVC/RA junction.     09/26/2018 Imaging    1. Examination is positive for nodal metastasis within the anterior and posterior mediastinum as well as right CP angle. 2. There is a small amount of right pleural fluid and pleural soft tissue nodularity which may reflect pleural involvement by tumor. 3. Large volume of ascites with evidence of peritoneal carcinomatosis with omental caking. 4. Aortic Atherosclerosis (ICD10-I70.0) and Emphysema (ICD10-J43.9). 5. Multi vessel coronary artery atherosclerotic calcifications including left main disease     Genetic Testing    Patient has genetic testing done for MMR. Results revealed MMR - normal.     HORMONAL RISK FACTORS:  Menarche was at age  7.  First live birth at age: no children.  OCP use for approximately 0 years.  Ovaries intact: yes.  Hysterectomy: no.  Menopausal status: postmenopausal.  HRT use: 0 years. Colonoscopy: yes; normal. Mammogram within the last year: yes. Number of breast biopsies: 0.   Past Medical History:  Diagnosis Date  . Chronic gouty arthritis   . Diabetes mellitus  without complication (HCC)    Type 2  . Hyperlipidemia   . Hypertension   . Lung cancer ([facility]) 2007   History of Non Small Cell Lung Cancer  . Thyroid disease    Hypothyroid    Past Surgical History:  Procedure Laterality Date  . COLONOSCOPY  02/19/2016  . IR IMAGING GUIDED PORT INSERTION  09/25/2018  . IR US GUIDE BX ASP/DRAIN  09/25/2018  . OTHER SURGICAL HISTORY  09/2005   Video-assisted thoracoscopic surgery for lung cancer by Dr. Prescott Gum  . THYROID SURGERY     Some type of thyroid surgery at age 65    Social History   Socioeconomic History  . Marital status: Married    Spouse name: Not on file  . Number of children: 0  . Years of education: Not on file  . Highest education level: Not on file  Occupational History  . Occupation: retired  Scientific laboratory technician  . Financial resource strain: Not on file  . Food insecurity:    Worry: Not on file    Inability: Not on file  . Transportation needs:    Medical: Not on file    Non-medical: Not on file  Tobacco Use  . Smoking status: Former Smoker    Types: Cigarettes    Last attempt to quit: 09/05/2005    Years since quitting: 13.1  . Smokeless tobacco: Never Used  Substance and Sexual Activity  . Alcohol use: Never    Frequency: Never  . Drug use: Never  . Sexual activity: Not on file  Lifestyle  . Physical activity:    Days per week: Not on file    Minutes per session: Not on file  . Stress: Not on file  Relationships  . Social connections:    Talks on phone: Not on file    Gets together: Not on file    Attends religious service: Not on file    Active member of club or organization: Not on file    Attends meetings of clubs or organizations: Not on file    Relationship status: Not on file  Other Topics Concern  . Not on file  Social History Narrative  . Not on file     FAMILY HISTORY:  We obtained a detailed, 4-generation family history.  Significant diagnoses are listed below: Family History  Problem  Relation Age of Onset  . Stomach cancer Mother    Christy Ellis does not have children. She had 3 sisters, 3 brothers, and 1 paternal half brother. She does not have a lot of information about her siblings. Only 2 brothers are still living. She believes one of her brothers may have had cancer.   Christy Ellis mother died in her 25s, she did not report a known cause although stomach cancer is listed in the patient's chart. Christy Ellis did have maternal aunts an uncles, she is unsure of how many and does not have information about them. None of them had cancer to her knowledge. No cousins with cancer to her knowledge, but again she reports limited knowledge of these individuals. She does not know how  old her maternal grandparents were when they passed or if they had cancer.  Christy Ellis father died in his 44's, no cancers she is aware of. Christy Ellis reports no knowledge of her paternal family history. She is unsure if her father had brothers/sisters, and does not know the age of death for her paternal grandparents.  Christy Ellis is unaware of previous family history of genetic testing for hereditary cancer risks. Patient's maternal ancestors are of African American descent, and paternal ancestors are of African American descent. There is no reported Ashkenazi Jewish ancestry. There is no known consanguinity.  GENETIC COUNSELING ASSESSMENT: Christy Ellis is a 68 y.o. female with a personal history of ovarian cancer which is somewhat suggestive of a Hereditary Cancer Predisposition Syndrome. We, therefore, discussed and recommended the following at today's visit.   DISCUSSION: We discussed that about 15-20% of ovarian cancer is hereditary, noting the BRCA genes and Lynch syndrome genes. We reviewed the characteristics, features and inheritance patterns of hereditary cancer syndromes. We also discussed genetic testing, including the appropriate family members to test, the process of testing, insurance coverage and  turn-around-time for results. We discussed the implications of a negative, positive and/or variant of uncertain significant result. We recommended Christy Ellis pursue genetic testing for the [facility] TumorNext HRD + CancerNext.  The CancerNext gene panel offered by [facility] includes sequencing and rearrangement analysis for the following 34 genes: APC, ATM, BARD1, BMPR1A, BRCA1, BRCA2, BRIP1, CDH1, CDK4, CDKN2A, CHEK2, DICER1, EPCAM, GREM1, HOXB13, MLH1, MRE11A, MSH2, MSH6, MUTYH, NBN, NF1, PALB2, PMS2, POLD1, POLE, PTEN, RAD50, RAD51C, RAD51D, SMAD4, SMARCA4, STK11, TP53.   We discussed that genetic testing through [facility] will test for hereditary mutations that could explain her diagnosis of cancer.  However, homologous recombination testing (HRD) is genetic testing performed on her tumor that can determine genetic changes that could influence her management.  HRD testing is performed in tandem with genetic testing, and typically at no additional cost.    We discussed that if she is found to have a mutation in one of these genes, it may impact surgical decisions, and alter future medical management recommendations such as increased cancer screenings and consideration of risk reducing surgeries.  A positive result could also have implications for the patient's family members.  A Negative result would mean we were unable to identify a hereditary component to her cancer, but does not rule out the possibility of a hereditary basis for her cancer.  There could be mutations that are undetectable by current technology, or in genes not yet tested or identified to increase cancer risk.    We discussed the potential to find a Variant of Uncertain Significance or VUS.  These are variants that have not yet been identified as pathogenic or benign, and it is unknown if this variant is associated with increased cancer risk or if this is a normal finding.  Most VUS's are reclassified to benign or likely benign.   It  should not be used to make medical management decisions. With time, we suspect the lab will determine the significance of any VUS's identified if any.   Based on Christy Ellis's personal history of cancer, she meets NCCN medical criteria for genetic testing. Despite that she meets criteria, she may still have an out of pocket cost. We discussed that if her out of pocket cost for testing is over $100, the laboratory will call and confirm whether she wants to proceed with testing.  If the out of pocket cost  of testing is less than $100 she will be billed by the genetic testing laboratory.   PLAN: Despite our recommendation, Christy Ellis did not wish to pursue genetic testing at today's visit. She is unsure if she wants this information and is concerned about cost, although we did discuss the lab's patient assistance program she still feels uncertain about whether she wants to proceed. She will let us know if she changes her mind. We understand this decision, and remain available to coordinate genetic testing at any time in the future. We; therefore, recommend Christy Ellis continue to follow the cancer screening guidelines given by her primary healthcare provider.  Lastly, we encouraged Christy Ellis to remain in contact with cancer genetics annually so that we can continuously update the family history and inform her of any changes in cancer genetics and testing that may be of benefit for this family.   Ms.  Ellis questions were answered to her satisfaction today. Our contact information was provided should additional questions or concerns arise. Thank you for the referral and allowing [place] to share in the care of your patient.   Faith Rogue, MS Genetic Counselor [email or web address] Phone: [phone]   The patient was seen for a total of 30 minutes in face-to-face genetic counseling.

## 2018-10-20 ENCOUNTER — Encounter: Payer: Self-pay | Admitting: Hematology and Oncology

## 2018-10-20 ENCOUNTER — Telehealth: Payer: Self-pay | Admitting: Hematology and Oncology

## 2018-10-20 ENCOUNTER — Inpatient Hospital Stay (HOSPITAL_BASED_OUTPATIENT_CLINIC_OR_DEPARTMENT_OTHER): Payer: Medicare HMO | Admitting: Hematology and Oncology

## 2018-10-20 ENCOUNTER — Inpatient Hospital Stay: Payer: Medicare HMO

## 2018-10-20 VITALS — BP 137/60 | HR 90 | Temp 98.4°F | Resp 18 | Ht 65.0 in | Wt 170.2 lb

## 2018-10-20 DIAGNOSIS — C786 Secondary malignant neoplasm of retroperitoneum and peritoneum: Secondary | ICD-10-CM

## 2018-10-20 DIAGNOSIS — R180 Malignant ascites: Secondary | ICD-10-CM | POA: Diagnosis not present

## 2018-10-20 DIAGNOSIS — N183 Chronic kidney disease, stage 3 unspecified: Secondary | ICD-10-CM

## 2018-10-20 DIAGNOSIS — D630 Anemia in neoplastic disease: Secondary | ICD-10-CM | POA: Insufficient documentation

## 2018-10-20 DIAGNOSIS — E119 Type 2 diabetes mellitus without complications: Secondary | ICD-10-CM

## 2018-10-20 DIAGNOSIS — Z5111 Encounter for antineoplastic chemotherapy: Secondary | ICD-10-CM | POA: Diagnosis not present

## 2018-10-20 DIAGNOSIS — Z7982 Long term (current) use of aspirin: Secondary | ICD-10-CM | POA: Diagnosis not present

## 2018-10-20 DIAGNOSIS — Z95828 Presence of other vascular implants and grafts: Secondary | ICD-10-CM

## 2018-10-20 DIAGNOSIS — C569 Malignant neoplasm of unspecified ovary: Secondary | ICD-10-CM

## 2018-10-20 DIAGNOSIS — Z7984 Long term (current) use of oral hypoglycemic drugs: Secondary | ICD-10-CM | POA: Diagnosis not present

## 2018-10-20 DIAGNOSIS — Z79899 Other long term (current) drug therapy: Secondary | ICD-10-CM

## 2018-10-20 LAB — CMP (CANCER CENTER ONLY)
ALBUMIN: 3.4 g/dL — AB (ref 3.5–5.0)
ALT: 18 U/L (ref 0–44)
AST: 26 U/L (ref 15–41)
Alkaline Phosphatase: 120 U/L (ref 38–126)
Anion gap: 9 (ref 5–15)
BUN: 14 mg/dL (ref 8–23)
CO2: 21 mmol/L — ABNORMAL LOW (ref 22–32)
Calcium: 8.9 mg/dL (ref 8.9–10.3)
Chloride: 111 mmol/L (ref 98–111)
Creatinine: 1.23 mg/dL — ABNORMAL HIGH (ref 0.44–1.00)
GFR, Est AFR Am: 52 mL/min — ABNORMAL LOW (ref >60)
GFR, Estimated: 45 mL/min — ABNORMAL LOW (ref >60)
GLUCOSE: 134 mg/dL — AB (ref 70–99)
Potassium: 4.7 mmol/L (ref 3.5–5.1)
Sodium: 141 mmol/L (ref 135–145)
Total Bilirubin: 0.3 mg/dL (ref 0.3–1.2)
Total Protein: 6.7 g/dL (ref 6.5–8.1)

## 2018-10-20 LAB — CBC WITH DIFFERENTIAL (CANCER CENTER ONLY)
Abs Immature Granulocytes: 0.05 10*3/uL (ref 0.00–0.07)
BASOS PCT: 1 %
Basophils Absolute: 0 10*3/uL (ref 0.0–0.1)
Eosinophils Absolute: 0 10*3/uL (ref 0.0–0.5)
Eosinophils Relative: 0 %
HCT: 25.4 % — ABNORMAL LOW (ref 36.0–46.0)
Hemoglobin: 8 g/dL — ABNORMAL LOW (ref 12.0–15.0)
IMMATURE GRANULOCYTES: 1 %
Lymphocytes Relative: 6 %
Lymphs Abs: 0.4 10*3/uL — ABNORMAL LOW (ref 0.7–4.0)
MCH: 26.8 pg (ref 26.0–34.0)
MCHC: 31.5 g/dL (ref 30.0–36.0)
MCV: 84.9 fL (ref 80.0–100.0)
Monocytes Absolute: 0.1 10*3/uL (ref 0.1–1.0)
Monocytes Relative: 1 %
NEUTROS PCT: 91 %
Neutro Abs: 5.5 10*3/uL (ref 1.7–7.7)
PLATELETS: 240 10*3/uL (ref 150–400)
RBC: 2.99 MIL/uL — ABNORMAL LOW (ref 3.87–5.11)
RDW: 17.1 % — ABNORMAL HIGH (ref 11.5–15.5)
WBC Count: 6.1 10*3/uL (ref 4.0–10.5)
nRBC: 0 % (ref 0.0–0.2)

## 2018-10-20 LAB — SAMPLE TO BLOOD BANK

## 2018-10-20 LAB — PREPARE RBC (CROSSMATCH)

## 2018-10-20 LAB — ABO/RH: ABO/RH(D): O POS

## 2018-10-20 MED ORDER — PALONOSETRON HCL INJECTION 0.25 MG/5ML
INTRAVENOUS | Status: AC
  Filled 2018-10-20: qty 5

## 2018-10-20 MED ORDER — DIPHENHYDRAMINE HCL 50 MG/ML IJ SOLN
50.0000 mg | Freq: Once | INTRAMUSCULAR | Status: AC
  Administered 2018-10-20: 50 mg via INTRAVENOUS

## 2018-10-20 MED ORDER — PALONOSETRON HCL INJECTION 0.25 MG/5ML
0.2500 mg | Freq: Once | INTRAVENOUS | Status: AC
  Administered 2018-10-20: 0.25 mg via INTRAVENOUS

## 2018-10-20 MED ORDER — DIPHENHYDRAMINE HCL 50 MG/ML IJ SOLN
INTRAMUSCULAR | Status: AC
  Filled 2018-10-20: qty 1

## 2018-10-20 MED ORDER — SODIUM CHLORIDE 0.9 % IV SOLN
483.0000 mg | Freq: Once | INTRAVENOUS | Status: AC
  Administered 2018-10-20: 480 mg via INTRAVENOUS
  Filled 2018-10-20: qty 48

## 2018-10-20 MED ORDER — SODIUM CHLORIDE 0.9 % IV SOLN
Freq: Once | INTRAVENOUS | Status: AC
  Administered 2018-10-20: 11:00:00 via INTRAVENOUS
  Filled 2018-10-20: qty 250

## 2018-10-20 MED ORDER — SODIUM CHLORIDE 0.9 % IV SOLN
140.0000 mg/m2 | Freq: Once | INTRAVENOUS | Status: AC
  Administered 2018-10-20: 270 mg via INTRAVENOUS
  Filled 2018-10-20: qty 45

## 2018-10-20 MED ORDER — SODIUM CHLORIDE 0.9 % IV SOLN
Freq: Once | INTRAVENOUS | Status: AC
  Administered 2018-10-20: 12:00:00 via INTRAVENOUS
  Filled 2018-10-20: qty 5

## 2018-10-20 MED ORDER — HEPARIN SOD (PORK) LOCK FLUSH 100 UNIT/ML IV SOLN
500.0000 [IU] | Freq: Once | INTRAVENOUS | Status: AC | PRN
  Administered 2018-10-20: 500 [IU]
  Filled 2018-10-20: qty 5

## 2018-10-20 MED ORDER — FAMOTIDINE IN NACL 20-0.9 MG/50ML-% IV SOLN
INTRAVENOUS | Status: AC
  Filled 2018-10-20: qty 50

## 2018-10-20 MED ORDER — FAMOTIDINE IN NACL 20-0.9 MG/50ML-% IV SOLN
20.0000 mg | Freq: Once | INTRAVENOUS | Status: AC
  Administered 2018-10-20: 20 mg via INTRAVENOUS

## 2018-10-20 MED ORDER — SODIUM CHLORIDE 0.9% FLUSH
10.0000 mL | INTRAVENOUS | Status: DC | PRN
  Administered 2018-10-20: 10 mL via INTRAVENOUS
  Filled 2018-10-20: qty 10

## 2018-10-20 MED ORDER — SODIUM CHLORIDE 0.9% FLUSH
10.0000 mL | INTRAVENOUS | Status: DC | PRN
  Administered 2018-10-20: 10 mL
  Filled 2018-10-20: qty 10

## 2018-10-20 NOTE — Assessment & Plan Note (Signed)
Renal function is stable.  We will continue aggressive blood pressure management

## 2018-10-20 NOTE — Progress Notes (Signed)
Per Dr. [name] ok to treat with Hgb of 8.0 patient will be getting 2 units tomorrow. Please draw Type and Screen in infusion. Lab will be sending tube over for collection. Charge Nurse notified.

## 2018-10-20 NOTE — Addendum Note (Signed)
Addended by: Jethro Bolus A on: 10/20/2018 12:23 PM   Modules accepted: Orders

## 2018-10-20 NOTE — Patient Instructions (Signed)
[facility] Discharge Instructions for Patients Receiving Chemotherapy  Today you received the following chemotherapy agents: Paclitaxel (Taxol) and Carboplatin (Paraplatin)  To help prevent nausea and vomiting after your treatment, we encourage you to take your nausea medication as directed. Received Aloxi during treatment today-->Take your Compazine prescription (not Zofran) for the next 3 days as needed.    If you develop nausea and vomiting that is not controlled by your nausea medication, call the clinic.   BELOW ARE SYMPTOMS THAT SHOULD BE REPORTED IMMEDIATELY:  *FEVER GREATER THAN 100.5 F  *CHILLS WITH OR WITHOUT FEVER  NAUSEA AND VOMITING THAT IS NOT CONTROLLED WITH YOUR NAUSEA MEDICATION  *UNUSUAL SHORTNESS OF BREATH  *UNUSUAL BRUISING OR BLEEDING  TENDERNESS IN MOUTH AND THROAT WITH OR WITHOUT PRESENCE OF ULCERS  *URINARY PROBLEMS  *BOWEL PROBLEMS  UNUSUAL RASH Items with * indicate a potential emergency and should be followed up as soon as possible.  Feel free to call the clinic should you have any questions or concerns. The clinic phone number is [phone].  Please show the [facility] at check-in to the Emergency Department and triage nurse.

## 2018-10-20 NOTE — Progress Notes (Signed)
[place] OFFICE PROGRESS NOTE  Patient Care Team: Caryl Bis, MD as PCP - General (Unknown Physician Specialty)  ASSESSMENT & PLAN:  Ovarian cancer [facility]) She tolerated cycle 1 of chemotherapy well.  However, she has persistent ascites along with anemia I recommend we proceed with treatment without delay We discussed blood transfusion for tomorrow and paracentesis next week. I recommend minimum 3 cycles of chemotherapy before we repeat imaging study  Malignant ascites She has persistent ascites I recommend therapeutic paracentesis next week and she agreed  CKD (chronic kidney disease), stage III ([place]) Renal function is stable.  We will continue aggressive blood pressure management  Diabetes mellitus without complication ([place]) We discussed dietary modification while on treatment.  She is at risk of hyperglycemia due to background history of diabetes and steroid use.  We will monitor her blood sugar carefully while on treatment  Anemia in neoplastic disease We discussed some of the risks, benefits, and alternatives of blood transfusions. The patient is symptomatic from anemia and the hemoglobin level is critically low.  Some of the side-effects to be expected including risks of transfusion reactions, chills, infection, syndrome of volume overload and risk of hospitalization from various reasons and the patient is willing to proceed and went ahead to sign consent today. She will receive 2 units of blood transfusion tomorrow.   Orders Placed This Encounter  Procedures  . US Paracentesis    Standing Status:   Future    Standing Expiration Date:   10/20/2019    Order Specific Question:   If therapeutic, is there a maximum amount of fluid to be removed?    Answer:   Yes    Comments:   5 liters    Order Specific Question:   What is the maximum amount of fluide to be removed?    Answer:   5 liters    Order Specific Question:   Are labs required for specimen  collection?    Answer:   No    Order Specific Question:   Is Albumin medication needed?    Answer:   No    Order Specific Question:   Reason for Exam (SYMPTOM  OR DIAGNOSIS REQUIRED)    Answer:   malignant ascites    Order Specific Question:   Preferred imaging location?    Answer:   [facility]  . Practitioner attestation of consent    I, the ordering practitioner, attest that I have discussed with the patient the benefits, risks, side effects, alternatives, likelihood of achieving goals and potential problems during recovery for the procedure listed.    Standing Status:   Future    Standing Expiration Date:   10/20/2019    Order Specific Question:   Procedure    Answer:   Blood Product(s)  . Complete patient signature process for consent form    Standing Status:   Future    Standing Expiration Date:   10/20/2019  . Care order/instruction    Transfuse Parameters    Standing Status:   Future    Standing Expiration Date:   10/20/2019  . Sample to Blood Bank    Standing Status:   Standing    Number of Occurrences:   33    Standing Expiration Date:   10/21/2019  . Type and screen         Standing Status:   Future    Standing Expiration Date:   10/21/2019  . Prepare RBC    Standing Status:  Standing    Number of Occurrences:   1    Order Specific Question:   # of Units    Answer:   2 units    Order Specific Question:   Transfusion Indications    Answer:   Symptomatic Anemia    Order Specific Question:   If emergent release call blood bank    Answer:   Not emergent release    INTERVAL HISTORY: Please see below for problem oriented charting. She is seen prior to cycle 2 of chemotherapy She complained of abdominal distention Denies nausea vomiting No recent cough, chest pain or shortness of breath Denies peripheral neuropathy No dizziness or chest pain The patient denies any recent signs or symptoms of bleeding such as spontaneous epistaxis, hematuria or  hematochezia.   SUMMARY OF ONCOLOGIC HISTORY: Oncology History   MMR normal     Ovarian cancer ([place])   08/25/2018 Initial Diagnosis    Initially presented with nausea, vomiting and abdominal distension    08/25/2018 Imaging    CT scan demonstrated right pleural thickening and a cardiophrenic lymph node measuring 1.5 cm.  The liver demonstrated mild nodularity and scalloping of the right lobe.  There were 2 less than 1 cm lesions seen within the liver.  There was large volume ascites and a 13 cm upper abdominal left upper quadrant omental cake visualized.  There was extensive peritoneal thickening along the abdomen and pelvic peritoneum.  There is a 1.5 cm aortocaval lymph node identified.  No pelvic adenopathy was identified.  In the pelvis the left adnexa revealed a 4.6 x 3.2 cm ill-defined solid and cystic structure.  No other large dominant pelvic masses identified.  A moderate hiatal hernia was seen and a small umbilical hernia containing fat was seen.    08/29/2018 Tumor Marker    Patient's tumor was tested for the following markers: CA-125. Results of the tumor marker test revealed 1060    09/15/2018 Cancer Staging    Staging form: Ovary, Fallopian Tube, and Primary Peritoneal Carcinoma, AJCC 8th Edition - Clinical: Stage IV (cT3c, cN1, cM1) - Signed by Heath Lark, MD on 09/15/2018    09/15/2018 Tumor Marker    Patient's tumor was tested for the following markers: CA-125. Results of the tumor marker test revealed 1491    09/25/2018 Pathology Results    Omentum, biopsy - HIGH GRADE CARCINOMA, CONSISTENT WITH PAPILLARY SEROUS CARCINOMA. - SEE COMMENT. Microscopic Comment The malignant cells are positive for cytokeratin 7, estrogen receptor, p53, pax-8, and wt-1. They are negative for cytokeratin 20 and CDX-2. The histology, in conjunction with this immunohistochemical profile, supports the above diagnosis. Dr. Vicente Males has reviewed the case and concurs with this interpretation.      09/25/2018 Procedure    Successful 8 French right internal jugular vein power port placement with its tip at the SVC/RA junction.     09/26/2018 Imaging    1. Examination is positive for nodal metastasis within the anterior and posterior mediastinum as well as right CP angle. 2. There is a small amount of right pleural fluid and pleural soft tissue nodularity which may reflect pleural involvement by tumor. 3. Large volume of ascites with evidence of peritoneal carcinomatosis with omental caking. 4. Aortic Atherosclerosis (ICD10-I70.0) and Emphysema (ICD10-J43.9). 5. Multi vessel coronary artery atherosclerotic calcifications including left main disease     Genetic Testing    Patient has genetic testing done for MMR. Results revealed MMR - normal.    09/29/2018 -  Chemotherapy  The patient had carboplatin and taxol     REVIEW OF SYSTEMS:   Constitutional: Denies fevers, chills or abnormal weight loss Eyes: Denies blurriness of vision Ears, nose, mouth, throat, and face: Denies mucositis or sore throat Respiratory: Denies cough, dyspnea or wheezes Cardiovascular: Denies palpitation, chest discomfort or lower extremity swelling Gastrointestinal:  Denies nausea, heartburn or change in bowel habits Skin: Denies abnormal skin rashes Lymphatics: Denies new lymphadenopathy or easy bruising Neurological:Denies numbness, tingling or new weaknesses Behavioral/Psych: Mood is stable, no new changes  All other systems were reviewed with the patient and are negative.  I have reviewed the past medical history, past surgical history, social history and family history with the patient and they are unchanged from previous note.  ALLERGIES:  has No Known Allergies.  MEDICATIONS:  Current Outpatient Medications  Medication Sig Dispense Refill  . allopurinol (ZYLOPRIM) 300 MG tablet Take 300 mg by mouth daily.    Marland Kitchen aspirin EC 81 MG tablet Take 81 mg by mouth daily.    Marland Kitchen atorvastatin (LIPITOR)  80 MG tablet Take 80 mg by mouth daily.    . benazepril (LOTENSIN) 20 MG tablet Take 20 mg by mouth daily.    . Cholecalciferol (VITAMIN D3 PO) Take 1,000 Units by mouth daily.    . cloNIDine (CATAPRES) 0.2 MG tablet Take 0.2 mg by mouth 2 (two) times daily.    Marland Kitchen dexamethasone (DECADRON) 4 MG tablet Take 2 tabs at the night before and 2 tab the morning of chemotherapy, every 3 weeks, by mouth 24 tablet 0  . diltiazem (CARDIZEM CD) 300 MG 24 hr capsule Take 300 mg by mouth daily.    Marland Kitchen levothyroxine (SYNTHROID, LEVOTHROID) 100 MCG tablet Take 100 mcg by mouth daily before breakfast.    . lidocaine-prilocaine (EMLA) cream Apply to affected area once 30 g 3  . metFORMIN (GLUCOPHAGE-XR) 500 MG 24 hr tablet Take 2,000 mg by mouth daily with breakfast.    . ondansetron (ZOFRAN) 8 MG tablet Take 1 tablet (8 mg total) by mouth every 8 (eight) hours as needed for refractory nausea / vomiting. Start on day 3 after chemo. 30 tablet 1  . prochlorperazine (COMPAZINE) 10 MG tablet Take 1 tablet (10 mg total) by mouth every 6 (six) hours as needed (Nausea or vomiting). 30 tablet 1   No current facility-administered medications for this visit.     PHYSICAL EXAMINATION: ECOG PERFORMANCE STATUS: 1 - Symptomatic but completely ambulatory  Vitals:   10/20/18 0936  BP: 137/60  Pulse: 90  Resp: 18  Temp: 98.4 F (36.9 C)  SpO2: 99%   Filed Weights   10/20/18 0936  Weight: 170 lb 3.2 oz (77.2 kg)    GENERAL:alert, no distress and comfortable SKIN: skin color, texture, turgor are normal, no rashes or significant lesions EYES: normal, Conjunctiva are pale and non-injected, sclera clear OROPHARYNX:no exudate, no erythema and lips, buccal mucosa, and tongue normal  NECK: supple, thyroid normal size, non-tender, without nodularity LYMPH:  no palpable lymphadenopathy in the cervical, axillary or inguinal LUNGS: clear to auscultation and percussion with normal breathing effort HEART: regular rate & rhythm  and no murmurs and no lower extremity edema ABDOMEN:abdomen soft, non-tender and normal bowel sounds.  Her abdomen is distended with ascites Musculoskeletal:no cyanosis of digits and no clubbing  NEURO: alert & oriented x 3 with fluent speech, no focal motor/sensory deficits  LABORATORY DATA:  I have reviewed the data as listed    Component Value Date/Time   NA  141 10/20/2018 0901   K 4.7 10/20/2018 0901   CL 111 10/20/2018 0901   CO2 21 (L) 10/20/2018 0901   GLUCOSE 134 (H) 10/20/2018 0901   BUN 14 10/20/2018 0901   CREATININE 1.23 (H) 10/20/2018 0901   CALCIUM 8.9 10/20/2018 0901   PROT 6.7 10/20/2018 0901   ALBUMIN 3.4 (L) 10/20/2018 0901   AST 26 10/20/2018 0901   ALT 18 10/20/2018 0901   ALKPHOS 120 10/20/2018 0901   BILITOT 0.3 10/20/2018 0901   GFRNONAA 45 (L) 10/20/2018 0901   GFRAA 52 (L) 10/20/2018 0901    No results found for: SPEP, UPEP  Lab Results  Component Value Date   WBC 6.1 10/20/2018   NEUTROABS 5.5 10/20/2018   HGB 8.0 (L) 10/20/2018   HCT 25.4 (L) 10/20/2018   MCV 84.9 10/20/2018   PLT 240 10/20/2018      Chemistry      Component Value Date/Time   NA 141 10/20/2018 0901   K 4.7 10/20/2018 0901   CL 111 10/20/2018 0901   CO2 21 (L) 10/20/2018 0901   BUN 14 10/20/2018 0901   CREATININE 1.23 (H) 10/20/2018 0901      Component Value Date/Time   CALCIUM 8.9 10/20/2018 0901   ALKPHOS 120 10/20/2018 0901   AST 26 10/20/2018 0901   ALT 18 10/20/2018 0901   BILITOT 0.3 10/20/2018 0901       RADIOGRAPHIC STUDIES: I have personally reviewed the radiological images as listed and agreed with the findings in the report. Ct Chest W Contrast  Result Date: 09/26/2018 CLINICAL DATA:  Lung cancer follow-up. New diagnosis of ovarian carcinoma with peritoneal disease. EXAM: CT CHEST WITH CONTRAST TECHNIQUE: Multidetector CT imaging of the chest was performed during intravenous contrast administration. CONTRAST:  49m OMNIPAQUE IOHEXOL 300 MG/ML  SOLN  COMPARISON:  10/21/2010 FINDINGS: Cardiovascular: The heart size appears normal. Trace pericardial fluid noted. Aortic atherosclerosis. Calcification in the LAD, left circumflex, RCA and left main coronary arteries noted. Mediastinum/Nodes: Normal appearance of the thyroid gland. The trachea appears patent and is midline. Moderate size hiatal hernia. Enlarged mediastinal lymph nodes are new when compared with the previous: -Index lymph node within the anterior mediastinum measures 1.4 cm, image 47/2. -Index anterior mediastinal lymph node measures 1.4 cm, image 39/2. -Large right posterior mediastinal lymph node measures 2.7 cm, image 92/2. -right CP angle lymph node measures 1.4 cm, image 103/2. Lungs/Pleura: Moderate changes of centrilobular emphysema. There are postoperative changes from right lower lobectomy. There is mild increased soft tissue and fluid within the right posterior costophrenic sulcus. Here too soft tissue nodules measure up to 3.3 cm, image 99/2. Smaller small pleural based nodules overlying the right lung base are noted which appear more conspicuous than on previous exam and suspicious for pleural disease. Upper Abdomen: There is a large volume of ascites with marked distension of the abdomen, new from previous exam. There is peritoneal nodularity and evidence of omental caking, image 147/2. Musculoskeletal: Postoperative changes involving the posterior aspect of right seventh and eighth ribs noted. No aggressive lytic or sclerotic bone lesions. IMPRESSION: 1. Examination is positive for nodal metastasis within the anterior and posterior mediastinum as well as right CP angle. 2. There is a small amount of right pleural fluid and pleural soft tissue nodularity which may reflect pleural involvement by tumor. 3. Large volume of ascites with evidence of peritoneal carcinomatosis with omental caking. 4. Aortic Atherosclerosis (ICD10-I70.0) and Emphysema (ICD10-J43.9). 5. Multi vessel coronary artery  atherosclerotic calcifications including  left main disease. Electronically Signed   By: Kerby Moors M.D.   On: 09/26/2018 17:45   Ir US Guide Bx Asp/drain  Result Date: 09/25/2018 INDICATION: Peritoneal carcinomatosis is suspected.  Umbilical lesion. EXAM: ULTRASOUND-GUIDED CORE BIOPSY OF AN UMBILICAL MASS MEDICATIONS: None. ANESTHESIA/SEDATION: Moderate (conscious) sedation was employed during this procedure. A total of Versed 1 mg and Fentanyl 0 mcg was administered intravenously. Moderate Sedation Time: Not applicable minutes. The patient's level of consciousness and vital signs were monitored continuously by radiology nursing throughout the procedure under my direct supervision. FLUOROSCOPY TIME:  None COMPLICATIONS: None immediate. PROCEDURE: Informed written consent was obtained from the patient after a thorough discussion of the procedural risks, benefits and alternatives. All questions were addressed. Maximal Sterile Barrier Technique was utilized including caps, mask, sterile gowns, sterile gloves, sterile drape, hand hygiene and skin antiseptic. A timeout was performed prior to the initiation of the procedure. The abdomen was prepped and draped in a sterile fashion. 1% lidocaine was utilized for local anesthesia. Under sonographic guidance, 2 18 gauge core biopsies of the omental mass were obtained. FINDINGS: Images document needle placement in the umbilical mass. Post biopsy sonographic imaging demonstrates no evidence of hemorrhage. IMPRESSION: Successful core biopsy of an umbilical mass. Electronically Signed   By: [name] M.D.   On: 09/25/2018 14:25   Ir Imaging Guided Port Insertion  Result Date: 09/25/2018 CLINICAL DATA:  Abdominal cancer EXAM: TUNNEL POWER PORT PLACEMENT WITH SUBCUTANEOUS POCKET UTILIZING ULTRASOUND & FLOUROSCOPY FLUOROSCOPY TIME:  36 seconds.  Six mGy. MEDICATIONS AND MEDICAL HISTORY: Versed 4 mg, Fentanyl 100 mcg. Additional Medications: 2 g Ancef. Antibiotics were  given within 2 hours of the procedure. ANESTHESIA/SEDATION: Moderate sedation time: 28 minutes. Nursing monitored the the patient during the procedure. PROCEDURE: After written informed consent was obtained, patient was placed in the supine position on angiographic table. The right neck and chest was prepped and draped in a sterile fashion. Lidocaine was utilized for local anesthesia. The right jugular vein was noted to be patent initially with ultrasound. Under sonographic guidance, a micropuncture needle was inserted into the right IJ vein (Ultrasound and fluoroscopic image documentation was performed). The needle was removed over an 018 wire which was exchanged for a Amplatz. This was advanced into the IVC. An 8-French dilator was advanced over the Amplatz. A small incision was made in the right upper chest over the anterior right second rib. Utilizing blunt dissection, a subcutaneous pocket was created in the caudal direction. The pocket was irrigated with a copious amount of sterile normal saline. The port catheter was tunneled from the chest incision, and out the neck incision. The reservoir was inserted into the subcutaneous pocket and secured with two 3-0 Ethilon stitches. A peel-away sheath was advanced over the Amplatz wire. The port catheter was cut to measure length and inserted through the peel-away sheath. The peel-away sheath was removed. The chest incision was closed with 3-0 Vicryl interrupted stitches for the subcutaneous tissue and a running of 4-0 Vicryl subcuticular stitch for the skin. The neck incision was closed with a 4-0 Vicryl subcuticular stitch. Derma-bond was applied to both surgical incisions. The port reservoir was flushed and instilled with heparinized saline. No complications. FINDINGS: A right IJ vein Port-A-Cath is in place with its tip at the cavoatrial junction. COMPLICATIONS: None IMPRESSION: Successful 8 French right internal jugular vein power port placement with its tip at  the SVC/RA junction. Electronically Signed   By: [name] M.D.   On:  09/25/2018 14:20    All questions were answered. The patient knows to call the clinic with any problems, questions or concerns. No barriers to learning was detected.  I spent 30 minutes counseling the patient face to face. The total time spent in the appointment was 40 minutes and more than 50% was on counseling and review of test results  Heath Lark, MD 10/20/2018 10:40 AM

## 2018-10-20 NOTE — Assessment & Plan Note (Signed)
She tolerated cycle 1 of chemotherapy well.  However, she has persistent ascites along with anemia I recommend we proceed with treatment without delay We discussed blood transfusion for tomorrow and paracentesis next week. I recommend minimum 3 cycles of chemotherapy before we repeat imaging study

## 2018-10-20 NOTE — Assessment & Plan Note (Signed)
We discussed dietary modification while on treatment.  She is at risk of hyperglycemia due to background history of diabetes and steroid use.  We will monitor her blood sugar carefully while on treatment

## 2018-10-20 NOTE — Assessment & Plan Note (Signed)
She has persistent ascites I recommend therapeutic paracentesis next week and she agreed

## 2018-10-20 NOTE — Telephone Encounter (Signed)
Gave avs and calendar ° °

## 2018-10-20 NOTE — Assessment & Plan Note (Signed)
We discussed some of the risks, benefits, and alternatives of blood transfusions. The patient is symptomatic from anemia and the hemoglobin level is critically low.  Some of the side-effects to be expected including risks of transfusion reactions, chills, infection, syndrome of volume overload and risk of hospitalization from various reasons and the patient is willing to proceed and went ahead to sign consent today. She will receive 2 units of blood transfusion tomorrow.

## 2018-10-21 ENCOUNTER — Inpatient Hospital Stay: Payer: Medicare HMO

## 2018-10-21 DIAGNOSIS — Z79899 Other long term (current) drug therapy: Secondary | ICD-10-CM | POA: Diagnosis not present

## 2018-10-21 DIAGNOSIS — C569 Malignant neoplasm of unspecified ovary: Secondary | ICD-10-CM | POA: Diagnosis not present

## 2018-10-21 DIAGNOSIS — C786 Secondary malignant neoplasm of retroperitoneum and peritoneum: Secondary | ICD-10-CM | POA: Diagnosis not present

## 2018-10-21 DIAGNOSIS — Z5111 Encounter for antineoplastic chemotherapy: Secondary | ICD-10-CM | POA: Diagnosis not present

## 2018-10-21 DIAGNOSIS — R180 Malignant ascites: Secondary | ICD-10-CM | POA: Diagnosis not present

## 2018-10-21 DIAGNOSIS — D630 Anemia in neoplastic disease: Secondary | ICD-10-CM | POA: Diagnosis not present

## 2018-10-21 DIAGNOSIS — Z7982 Long term (current) use of aspirin: Secondary | ICD-10-CM | POA: Diagnosis not present

## 2018-10-21 DIAGNOSIS — E119 Type 2 diabetes mellitus without complications: Secondary | ICD-10-CM | POA: Diagnosis not present

## 2018-10-21 DIAGNOSIS — N183 Chronic kidney disease, stage 3 (moderate): Secondary | ICD-10-CM | POA: Diagnosis not present

## 2018-10-21 DIAGNOSIS — Z7984 Long term (current) use of oral hypoglycemic drugs: Secondary | ICD-10-CM | POA: Diagnosis not present

## 2018-10-21 LAB — CA 125: Cancer Antigen (CA) 125: 2146 U/mL — ABNORMAL HIGH (ref 0.0–38.1)

## 2018-10-21 MED ORDER — SODIUM CHLORIDE 0.9% FLUSH
10.0000 mL | INTRAVENOUS | Status: AC | PRN
  Administered 2018-10-21: 10 mL
  Filled 2018-10-21: qty 10

## 2018-10-21 MED ORDER — SODIUM CHLORIDE 0.9% IV SOLUTION
250.0000 mL | Freq: Once | INTRAVENOUS | Status: AC
  Administered 2018-10-21: 250 mL via INTRAVENOUS
  Filled 2018-10-21: qty 250

## 2018-10-21 MED ORDER — HEPARIN SOD (PORK) LOCK FLUSH 100 UNIT/ML IV SOLN
500.0000 [IU] | Freq: Every day | INTRAVENOUS | Status: AC | PRN
  Administered 2018-10-21: 500 [IU]
  Filled 2018-10-21: qty 5

## 2018-10-21 MED ORDER — HEPARIN SOD (PORK) LOCK FLUSH 100 UNIT/ML IV SOLN
250.0000 [IU] | INTRAVENOUS | Status: DC | PRN
  Filled 2018-10-21: qty 5

## 2018-10-21 MED ORDER — SODIUM CHLORIDE 0.9% FLUSH
3.0000 mL | INTRAVENOUS | Status: DC | PRN
  Filled 2018-10-21: qty 10

## 2018-10-21 NOTE — Patient Instructions (Signed)
Blood Transfusion, Adult, Care After This sheet gives you information about how to care for yourself after your procedure. Your doctor may also give you more specific instructions. If you have problems or questions, contact your doctor. Follow these instructions at home:   Take over-the-counter and prescription medicines only as told by your doctor.  Go back to your normal activities as told by your doctor.  Follow instructions from your doctor about how to take care of the area where an IV tube was put into your vein (insertion site). Make sure you: ? Wash your hands with soap and water before you change your bandage (dressing). If there is no soap and water, use hand sanitizer. ? Change your bandage as told by your doctor.  Check your IV insertion site every day for signs of infection. Check for: ? More redness, swelling, or pain. ? More fluid or blood. ? Warmth. ? Pus or a bad smell. Contact a doctor if:  You have more redness, swelling, or pain around the IV insertion site.  You have more fluid or blood coming from the IV insertion site.  Your IV insertion site feels warm to the touch.  You have pus or a bad smell coming from the IV insertion site.  Your pee (urine) turns pink, red, or brown.  You feel weak after doing your normal activities. Get help right away if:  You have signs of a serious allergic or body defense (immune) system reaction, including: ? Itchiness. ? Hives. ? Trouble breathing. ? Anxiety. ? Pain in your chest or lower back. ? Fever, flushing, and chills. ? Fast pulse. ? Rash. ? Watery poop (diarrhea). ? Throwing up (vomiting). ? Dark pee. ? Serious headache. ? Dizziness. ? Stiff neck. ? Yellow color in your face or the white parts of your eyes (jaundice). Summary  After a blood transfusion, return to your normal activities as told by your doctor.  Every day, check for signs of infection where the IV tube was put into your vein.  Some  signs of infection are warm skin, more redness and pain, more fluid or blood, and pus or a bad smell where the needle went in.  Contact your doctor if you feel weak or have any unusual symptoms. This information is not intended to replace advice given to you by your health care provider. Make sure you discuss any questions you have with your health care provider. Document Released: 09/13/2014 Document Revised: 04/16/2016 Document Reviewed: 04/16/2016 Elsevier Interactive Patient Education  2019 [employer]  

## 2018-10-23 LAB — BPAM RBC
BLOOD PRODUCT EXPIRATION DATE: 202003122359
Blood Product Expiration Date: 202003122359
ISSUE DATE / TIME: 202002150819
ISSUE DATE / TIME: 202002150819
UNIT TYPE AND RH: 5100
Unit Type and Rh: 5100

## 2018-10-23 LAB — TYPE AND SCREEN
ABO/RH(D): O POS
Antibody Screen: NEGATIVE
Unit division: 0
Unit division: 0

## 2018-10-25 ENCOUNTER — Encounter (HOSPITAL_COMMUNITY): Payer: Self-pay | Admitting: Hematology and Oncology

## 2018-10-25 ENCOUNTER — Ambulatory Visit (HOSPITAL_COMMUNITY)
Admission: RE | Admit: 2018-10-25 | Discharge: 2018-10-25 | Disposition: A | Discharge status: Home / Self Care | Payer: Medicare HMO | Source: Ambulatory Visit | Attending: Hematology and Oncology | Admitting: Hematology and Oncology

## 2018-10-25 DIAGNOSIS — R188 Other ascites: Secondary | ICD-10-CM | POA: Diagnosis not present

## 2018-10-25 DIAGNOSIS — R180 Malignant ascites: Secondary | ICD-10-CM | POA: Insufficient documentation

## 2018-10-25 MED ORDER — LIDOCAINE HCL 1 % IJ SOLN
INTRAMUSCULAR | Status: AC
  Filled 2018-10-25: qty 20

## 2018-10-25 NOTE — Procedures (Signed)
Ultrasound-guided therapeutic paracentesis performed yielding 5 liters (maximum ordered) of hazy,amber colored fluid. No immediate complications.EBL < 2 cc.

## 2018-10-27 ENCOUNTER — Telehealth: Payer: Self-pay

## 2018-10-27 NOTE — Telephone Encounter (Signed)
I do not suggest preparation H due to risk of infection Sitz bath is safe. She can consider stopping aspirin for a few days and if bleeding stops resume aspirin

## 2018-10-27 NOTE — Telephone Encounter (Signed)
Her family member called and left a message to call.  Called back and spoke with Ms. Kleinschmidt she started having bleeding with her stool today. She was constipated and now her stools are normal. She feels like the bleeding is from her hemorrhoids.  She feels swollen and wants to know if it would be okay to use preparation H?

## 2018-10-27 NOTE — Telephone Encounter (Signed)
Called and given below message. She verbalized understanding. Instructed to call for questions or further problems. She verbalized understanding.

## 2018-11-03 DIAGNOSIS — I10 Essential (primary) hypertension: Secondary | ICD-10-CM | POA: Diagnosis not present

## 2018-11-03 DIAGNOSIS — E782 Mixed hyperlipidemia: Secondary | ICD-10-CM | POA: Diagnosis not present

## 2018-11-03 DIAGNOSIS — E039 Hypothyroidism, unspecified: Secondary | ICD-10-CM | POA: Diagnosis not present

## 2018-11-10 ENCOUNTER — Inpatient Hospital Stay: Payer: Medicare HMO

## 2018-11-10 ENCOUNTER — Encounter: Payer: Self-pay | Admitting: Hematology and Oncology

## 2018-11-10 ENCOUNTER — Inpatient Hospital Stay: Payer: Medicare HMO | Attending: Gynecologic Oncology

## 2018-11-10 ENCOUNTER — Inpatient Hospital Stay: Payer: Medicare HMO | Admitting: Hematology and Oncology

## 2018-11-10 VITALS — BP 134/59 | HR 84 | Temp 97.4°F | Resp 18 | Ht 65.0 in | Wt 160.8 lb

## 2018-11-10 DIAGNOSIS — Z5111 Encounter for antineoplastic chemotherapy: Secondary | ICD-10-CM | POA: Insufficient documentation

## 2018-11-10 DIAGNOSIS — Z79899 Other long term (current) drug therapy: Secondary | ICD-10-CM | POA: Diagnosis not present

## 2018-11-10 DIAGNOSIS — D630 Anemia in neoplastic disease: Secondary | ICD-10-CM | POA: Insufficient documentation

## 2018-11-10 DIAGNOSIS — C569 Malignant neoplasm of unspecified ovary: Secondary | ICD-10-CM

## 2018-11-10 DIAGNOSIS — Z7984 Long term (current) use of oral hypoglycemic drugs: Secondary | ICD-10-CM | POA: Diagnosis not present

## 2018-11-10 DIAGNOSIS — C786 Secondary malignant neoplasm of retroperitoneum and peritoneum: Secondary | ICD-10-CM

## 2018-11-10 DIAGNOSIS — Z7982 Long term (current) use of aspirin: Secondary | ICD-10-CM | POA: Diagnosis not present

## 2018-11-10 DIAGNOSIS — R180 Malignant ascites: Secondary | ICD-10-CM

## 2018-11-10 DIAGNOSIS — N183 Chronic kidney disease, stage 3 unspecified: Secondary | ICD-10-CM

## 2018-11-10 LAB — CBC WITH DIFFERENTIAL (CANCER CENTER ONLY)
Abs Immature Granulocytes: 0.05 10*3/uL (ref 0.00–0.07)
Basophils Absolute: 0 10*3/uL (ref 0.0–0.1)
Basophils Relative: 0 %
Eosinophils Absolute: 0 10*3/uL (ref 0.0–0.5)
Eosinophils Relative: 0 %
HCT: 33.6 % — ABNORMAL LOW (ref 36.0–46.0)
Hemoglobin: 10.5 g/dL — ABNORMAL LOW (ref 12.0–15.0)
Immature Granulocytes: 1 %
LYMPHS ABS: 0.8 10*3/uL (ref 0.7–4.0)
Lymphocytes Relative: 10 %
MCH: 27.4 pg (ref 26.0–34.0)
MCHC: 31.3 g/dL (ref 30.0–36.0)
MCV: 87.7 fL (ref 80.0–100.0)
Monocytes Absolute: 0.1 10*3/uL (ref 0.1–1.0)
Monocytes Relative: 1 %
Neutro Abs: 7.3 10*3/uL (ref 1.7–7.7)
Neutrophils Relative %: 88 %
Platelet Count: 216 10*3/uL (ref 150–400)
RBC: 3.83 MIL/uL — ABNORMAL LOW (ref 3.87–5.11)
RDW: 17.5 % — ABNORMAL HIGH (ref 11.5–15.5)
WBC Count: 8.2 10*3/uL (ref 4.0–10.5)
nRBC: 0 % (ref 0.0–0.2)

## 2018-11-10 LAB — COMPREHENSIVE METABOLIC PANEL
ALT: 22 U/L (ref 0–44)
AST: 24 U/L (ref 15–41)
Albumin: 3.6 g/dL (ref 3.5–5.0)
Alkaline Phosphatase: 119 U/L (ref 38–126)
Anion gap: 9 (ref 5–15)
BUN: 19 mg/dL (ref 8–23)
CO2: 23 mmol/L (ref 22–32)
Calcium: 9.1 mg/dL (ref 8.9–10.3)
Chloride: 107 mmol/L (ref 98–111)
Creatinine, Ser: 1.02 mg/dL — ABNORMAL HIGH (ref 0.44–1.00)
GFR calc Af Amer: >60 mL/min (ref >60)
GFR calc non Af Amer: 56 mL/min — ABNORMAL LOW (ref >60)
Glucose, Bld: 133 mg/dL — ABNORMAL HIGH (ref 70–99)
Potassium: 4.5 mmol/L (ref 3.5–5.1)
SODIUM: 139 mmol/L (ref 135–145)
Total Bilirubin: 0.5 mg/dL (ref 0.3–1.2)
Total Protein: 6.7 g/dL (ref 6.5–8.1)

## 2018-11-10 LAB — SAMPLE TO BLOOD BANK

## 2018-11-10 MED ORDER — SODIUM CHLORIDE 0.9 % IV SOLN
480.0000 mg | Freq: Once | INTRAVENOUS | Status: AC
  Administered 2018-11-10: 480 mg via INTRAVENOUS
  Filled 2018-11-10: qty 48

## 2018-11-10 MED ORDER — SODIUM CHLORIDE 0.9 % IV SOLN
Freq: Once | INTRAVENOUS | Status: AC
  Administered 2018-11-10: 11:00:00 via INTRAVENOUS
  Filled 2018-11-10: qty 250

## 2018-11-10 MED ORDER — PALONOSETRON HCL INJECTION 0.25 MG/5ML
INTRAVENOUS | Status: AC
  Filled 2018-11-10: qty 5

## 2018-11-10 MED ORDER — DIPHENHYDRAMINE HCL 50 MG/ML IJ SOLN
50.0000 mg | Freq: Once | INTRAMUSCULAR | Status: AC
  Administered 2018-11-10: 50 mg via INTRAVENOUS

## 2018-11-10 MED ORDER — SODIUM CHLORIDE 0.9% FLUSH
10.0000 mL | Freq: Once | INTRAVENOUS | Status: AC
  Administered 2018-11-10: 10 mL
  Filled 2018-11-10: qty 10

## 2018-11-10 MED ORDER — FAMOTIDINE IN NACL 20-0.9 MG/50ML-% IV SOLN: 20.0000 mg | Freq: Once | INTRAVENOUS | Status: DC

## 2018-11-10 MED ORDER — SODIUM CHLORIDE 0.9% FLUSH
10.0000 mL | INTRAVENOUS | Status: DC | PRN
  Administered 2018-11-10: 10 mL
  Filled 2018-11-10: qty 10

## 2018-11-10 MED ORDER — PALONOSETRON HCL INJECTION 0.25 MG/5ML
0.2500 mg | Freq: Once | INTRAVENOUS | Status: AC
  Administered 2018-11-10: 0.25 mg via INTRAVENOUS

## 2018-11-10 MED ORDER — HEPARIN SOD (PORK) LOCK FLUSH 100 UNIT/ML IV SOLN
500.0000 [IU] | Freq: Once | INTRAVENOUS | Status: AC | PRN
  Administered 2018-11-10: 500 [IU]
  Filled 2018-11-10: qty 5

## 2018-11-10 MED ORDER — SODIUM CHLORIDE 0.9 % IV SOLN
140.0000 mg/m2 | Freq: Once | INTRAVENOUS | Status: AC
  Administered 2018-11-10: 270 mg via INTRAVENOUS
  Filled 2018-11-10: qty 45

## 2018-11-10 MED ORDER — SODIUM CHLORIDE 0.9 % IV SOLN
Freq: Once | INTRAVENOUS | Status: AC
  Administered 2018-11-10: 12:00:00 via INTRAVENOUS
  Filled 2018-11-10: qty 5

## 2018-11-10 MED ORDER — SODIUM CHLORIDE 0.9 % IV SOLN
20.0000 mg | Freq: Once | INTRAVENOUS | Status: AC
  Administered 2018-11-10: 20 mg via INTRAVENOUS
  Filled 2018-11-10: qty 2

## 2018-11-10 MED ORDER — DIPHENHYDRAMINE HCL 50 MG/ML IJ SOLN
INTRAMUSCULAR | Status: AC
  Filled 2018-11-10: qty 1

## 2018-11-10 NOTE — Patient Instructions (Addendum)
[place] Discharge Instructions for Patients Receiving Chemotherapy  Paracentesis scheduled for 10:00AM on Tues March 10th.  Arrive to [facility] Radiology at 9:45AM Do not take morning dose of Metformin.  Today you received the following chemotherapy agents Paclitaxel (TAXOL) & Carboplatin (PARAPLATIN).  To help prevent nausea and vomiting after your treatment, we encourage you to take your nausea medication as prescribed.   If you develop nausea and vomiting that is not controlled by your nausea medication, call the clinic.   BELOW ARE SYMPTOMS THAT SHOULD BE REPORTED IMMEDIATELY:  *FEVER GREATER THAN 100.5 F  *CHILLS WITH OR WITHOUT FEVER  NAUSEA AND VOMITING THAT IS NOT CONTROLLED WITH YOUR NAUSEA MEDICATION  *UNUSUAL SHORTNESS OF BREATH  *UNUSUAL BRUISING OR BLEEDING  TENDERNESS IN MOUTH AND THROAT WITH OR WITHOUT PRESENCE OF ULCERS  *URINARY PROBLEMS  *BOWEL PROBLEMS  UNUSUAL RASH Items with * indicate a potential emergency and should be followed up as soon as possible.  Feel free to call the clinic should you have any questions or concerns. The clinic phone number is [phone].  Please show the [place] at check-in to the Emergency Department and triage nurse.

## 2018-11-10 NOTE — Progress Notes (Signed)
[place] OFFICE PROGRESS NOTE  Patient Care Team: Caryl Bis, MD as PCP - General (Unknown Physician Specialty)  ASSESSMENT & PLAN:  Ovarian cancer [facility]) Clinically, she is responding well to treatment with resolution of her abdominal pain She still have persistent ascites We will proceed with chemotherapy today as scheduled I plan to repeat CT imaging in 3 weeks Her next dose of chemotherapy after today will be on 12/04/2018  Anemia in neoplastic disease She does not need blood transfusion today We will proceed with treatment as scheduled  Malignant ascites She has persistent abdominal ascites but overall is improving I will order 1 more therapeutic paracentesis next week  CKD (chronic kidney disease), stage III ([place]) Renal function is stable.  We will continue aggressive blood pressure management and other risk factor modifications   Orders Placed This Encounter  Procedures  . CT ABDOMEN PELVIS W CONTRAST    Standing Status:   Future    Standing Expiration Date:   11/10/2019    Order Specific Question:   If indicated for the ordered procedure, I authorize the administration of contrast media per Radiology protocol    Answer:   Yes    Order Specific Question:   Preferred imaging location?    Answer:   [facility]    Order Specific Question:   Radiology Contrast Protocol - do NOT remove file path    Answer:   _0 [email or web address]  . CT CHEST W CONTRAST    Standing Status:   Future    Standing Expiration Date:   11/10/2019    Order Specific Question:   If indicated for the ordered procedure, I authorize the administration of contrast media per Radiology protocol    Answer:   Yes    Order Specific Question:   Preferred imaging location?    Answer:   [facility]    Order Specific Question:   Radiology Contrast Protocol - do NOT remove file path    Answer:   _1 [email or web address]  . IR  Paracentesis    Standing Status:   Future    Standing Expiration Date:   01/10/2020    Order Specific Question:   If therapeutic, is there a maximum amount of fluid to be removed?    Answer:   Yes    Order Specific Question:   What is the maximum amount of fluide to be removed?    Answer:   5 liters    Order Specific Question:   Are labs required for specimen collection?    Answer:   No    Order Specific Question:   Is Albumin medication needed?    Answer:   No    Order Specific Question:   Reason for Exam (SYMPTOM  OR DIAGNOSIS REQUIRED)    Answer:   malignant ascites    Order Specific Question:   Preferred Imaging Location?    Answer:   [facility]    INTERVAL HISTORY: Please see below for problem oriented charting. She returns with her son for further follow-up and chemotherapy She tolerated recent treatment well Her abdominal pain has resolved She has no nausea or changes in bowel habits Since most recent paracentesis, her abdominal swelling is improved She denies cough, chest pain or shortness of breath No peripheral neuropathy from treatment SUMMARY OF ONCOLOGIC HISTORY: Oncology History   MMR normal NTRK normal     Ovarian cancer ([place])   08/25/2018 Initial Diagnosis    Initially presented with nausea,  vomiting and abdominal distension    08/25/2018 Imaging    CT scan demonstrated right pleural thickening and a cardiophrenic lymph node measuring 1.5 cm.  The liver demonstrated mild nodularity and scalloping of the right lobe.  There were 2 less than 1 cm lesions seen within the liver.  There was large volume ascites and a 13 cm upper abdominal left upper quadrant omental cake visualized.  There was extensive peritoneal thickening along the abdomen and pelvic peritoneum.  There is a 1.5 cm aortocaval lymph node identified.  No pelvic adenopathy was identified.  In the pelvis the left adnexa revealed a 4.6 x 3.2 cm ill-defined solid and cystic structure.  No other large  dominant pelvic masses identified.  A moderate hiatal hernia was seen and a small umbilical hernia containing fat was seen.    08/29/2018 Tumor Marker    Patient's tumor was tested for the following markers: CA-125. Results of the tumor marker test revealed 1060    09/15/2018 Cancer Staging    Staging form: Ovary, Fallopian Tube, and Primary Peritoneal Carcinoma, AJCC 8th Edition - Clinical: Stage IV (cT3c, cN1, cM1) - Signed by Heath Lark, MD on 09/15/2018    09/15/2018 Tumor Marker    Patient's tumor was tested for the following markers: CA-125. Results of the tumor marker test revealed 1491    09/25/2018 Pathology Results    Omentum, biopsy - HIGH GRADE CARCINOMA, CONSISTENT WITH PAPILLARY SEROUS CARCINOMA. - SEE COMMENT. Microscopic Comment The malignant cells are positive for cytokeratin 7, estrogen receptor, p53, pax-8, and wt-1. They are negative for cytokeratin 20 and CDX-2. The histology, in conjunction with this immunohistochemical profile, supports the above diagnosis. Dr. Vicente Males has reviewed the case and concurs with this interpretation.     09/25/2018 Procedure    Successful 8 French right internal jugular vein power port placement with its tip at the SVC/RA junction.     09/26/2018 Imaging    1. Examination is positive for nodal metastasis within the anterior and posterior mediastinum as well as right CP angle. 2. There is a small amount of right pleural fluid and pleural soft tissue nodularity which may reflect pleural involvement by tumor. 3. Large volume of ascites with evidence of peritoneal carcinomatosis with omental caking. 4. Aortic Atherosclerosis (ICD10-I70.0) and Emphysema (ICD10-J43.9). 5. Multi vessel coronary artery atherosclerotic calcifications including left main disease     Genetic Testing    Patient has genetic testing done for MMR. Results revealed MMR - normal.    09/29/2018 -  Chemotherapy    The patient had carboplatin and taxol    10/20/2018  Tumor Marker    Patient's tumor was tested for the following markers: CA-125. Results of the tumor marker test revealed 2146    10/25/2018 Procedure    Successful ultrasound-guided therapeutic paracentesis yielding 5 liters of peritoneal fluid.      REVIEW OF SYSTEMS:   Constitutional: Denies fevers, chills or abnormal weight loss Eyes: Denies blurriness of vision Ears, nose, mouth, throat, and face: Denies mucositis or sore throat Respiratory: Denies cough, dyspnea or wheezes Cardiovascular: Denies palpitation, chest discomfort or lower extremity swelling Gastrointestinal:  Denies nausea, heartburn or change in bowel habits Skin: Denies abnormal skin rashes Lymphatics: Denies new lymphadenopathy or easy bruising Neurological:Denies numbness, tingling or new weaknesses Behavioral/Psych: Mood is stable, no new changes  All other systems were reviewed with the patient and are negative.  I have reviewed the past medical history, past surgical history, social history and family history  with the patient and they are unchanged from previous note.  ALLERGIES:  has No Known Allergies.  MEDICATIONS:  Current Outpatient Medications  Medication Sig Dispense Refill  . allopurinol (ZYLOPRIM) 300 MG tablet Take 300 mg by mouth daily.    Marland Kitchen aspirin EC 81 MG tablet Take 81 mg by mouth daily.    Marland Kitchen atorvastatin (LIPITOR) 80 MG tablet Take 80 mg by mouth daily.    . benazepril (LOTENSIN) 20 MG tablet Take 20 mg by mouth daily.    . Cholecalciferol (VITAMIN D3 PO) Take 1,000 Units by mouth daily.    . cloNIDine (CATAPRES) 0.2 MG tablet Take 0.2 mg by mouth 2 (two) times daily.    Marland Kitchen dexamethasone (DECADRON) 4 MG tablet Take 2 tabs at the night before and 2 tab the morning of chemotherapy, every 3 weeks, by mouth 24 tablet 0  . diltiazem (CARDIZEM CD) 300 MG 24 hr capsule Take 300 mg by mouth daily.    Marland Kitchen levothyroxine (SYNTHROID, LEVOTHROID) 100 MCG tablet Take 100 mcg by mouth daily before breakfast.     . lidocaine-prilocaine (EMLA) cream Apply to affected area once 30 g 3  . metFORMIN (GLUCOPHAGE-XR) 500 MG 24 hr tablet Take 2,000 mg by mouth daily with breakfast.    . ondansetron (ZOFRAN) 8 MG tablet Take 1 tablet (8 mg total) by mouth every 8 (eight) hours as needed for refractory nausea / vomiting. Start on day 3 after chemo. 30 tablet 1  . prochlorperazine (COMPAZINE) 10 MG tablet Take 1 tablet (10 mg total) by mouth every 6 (six) hours as needed (Nausea or vomiting). 30 tablet 1   No current facility-administered medications for this visit.     PHYSICAL EXAMINATION: ECOG PERFORMANCE STATUS: 2 - Symptomatic, <50% confined to bed  Vitals:   11/10/18 1014  BP: (!) 134/59  Pulse: 84  Resp: 18  Temp: (!) 97.4 F (36.3 C)  SpO2: 100%   Filed Weights   11/10/18 1014  Weight: 160 lb 12.8 oz (72.9 kg)    GENERAL:alert, no distress and comfortable SKIN: skin color, texture, turgor are normal, no rashes or significant lesions EYES: normal, Conjunctiva are pink and non-injected, sclera clear OROPHARYNX:no exudate, no erythema and lips, buccal mucosa, and tongue normal  NECK: supple, thyroid normal size, non-tender, without nodularity LYMPH:  no palpable lymphadenopathy in the cervical, axillary or inguinal LUNGS: clear to auscultation and percussion with normal breathing effort HEART: regular rate & rhythm and no murmurs and no lower extremity edema ABDOMEN:abdomen soft, non-tender and normal bowel sounds.  It is distended with ascites but improved compared to prior visit Musculoskeletal:no cyanosis of digits and no clubbing  NEURO: alert & oriented x 3 with fluent speech, no focal motor/sensory deficits  LABORATORY DATA:  I have reviewed the data as listed    Component Value Date/Time   NA 139 11/10/2018 0955   K 4.5 11/10/2018 0955   CL 107 11/10/2018 0955   CO2 23 11/10/2018 0955   GLUCOSE 133 (H) 11/10/2018 0955   BUN 19 11/10/2018 0955   CREATININE 1.02 (H)  11/10/2018 0955   CREATININE 1.23 (H) 10/20/2018 0901   CALCIUM 9.1 11/10/2018 0955   PROT 6.7 11/10/2018 0955   ALBUMIN 3.6 11/10/2018 0955   AST 24 11/10/2018 0955   AST 26 10/20/2018 0901   ALT 22 11/10/2018 0955   ALT 18 10/20/2018 0901   ALKPHOS 119 11/10/2018 0955   BILITOT 0.5 11/10/2018 0955   BILITOT 0.3 10/20/2018 0901  GFRNONAA 56 (L) 11/10/2018 0955   GFRNONAA 45 (L) 10/20/2018 0901   GFRAA >60 11/10/2018 0955   GFRAA 52 (L) 10/20/2018 0901    No results found for: SPEP, UPEP  Lab Results  Component Value Date   WBC 8.2 11/10/2018   NEUTROABS 7.3 11/10/2018   HGB 10.5 (L) 11/10/2018   HCT 33.6 (L) 11/10/2018   MCV 87.7 11/10/2018   PLT 216 11/10/2018      Chemistry      Component Value Date/Time   NA 139 11/10/2018 0955   K 4.5 11/10/2018 0955   CL 107 11/10/2018 0955   CO2 23 11/10/2018 0955   BUN 19 11/10/2018 0955   CREATININE 1.02 (H) 11/10/2018 0955   CREATININE 1.23 (H) 10/20/2018 0901      Component Value Date/Time   CALCIUM 9.1 11/10/2018 0955   ALKPHOS 119 11/10/2018 0955   AST 24 11/10/2018 0955   AST 26 10/20/2018 0901   ALT 22 11/10/2018 0955   ALT 18 10/20/2018 0901   BILITOT 0.5 11/10/2018 0955   BILITOT 0.3 10/20/2018 0901       RADIOGRAPHIC STUDIES: I have personally reviewed the radiological images as listed and agreed with the findings in the report. US Paracentesis  Result Date: 10/25/2018 INDICATION: Patient with history of ovarian cancer, ascites. Request made for therapeutic paracentesis up to 5 liters. EXAM: ULTRASOUND GUIDED THERAPEUTIC PARACENTESIS MEDICATIONS: None COMPLICATIONS: None immediate. PROCEDURE: Informed written consent was obtained from the patient after a discussion of the risks, benefits and alternatives to treatment. A timeout was performed prior to the initiation of the procedure. Initial ultrasound scanning demonstrates a large amount of ascites within the left lower abdominal quadrant. The left lower  abdomen was prepped and draped in the usual sterile fashion. 1% lidocaine was used for local anesthesia. Following this, a 6 Fr Safe-T-Centesis catheter was introduced. An ultrasound image was saved for documentation purposes. The paracentesis was performed. The catheter was removed and a dressing was applied. The patient tolerated the procedure well without immediate post procedural complication. FINDINGS: A total of approximately 5 liters of hazy, amber fluid was removed. IMPRESSION: Successful ultrasound-guided therapeutic paracentesis yielding 5 liters of peritoneal fluid. Read by: Rowe Robert, PA-C Electronically Signed   By: Markus Daft M.D.   On: 10/25/2018 12:44    All questions were answered. The patient knows to call the clinic with any problems, questions or concerns. No barriers to learning was detected.  I spent 25 minutes counseling the patient face to face. The total time spent in the appointment was 40 minutes and more than 50% was on counseling and review of test results  Heath Lark, MD 11/10/2018 10:53 AM

## 2018-11-10 NOTE — Assessment & Plan Note (Signed)
Clinically, she is responding well to treatment with resolution of her abdominal pain She still have persistent ascites We will proceed with chemotherapy today as scheduled I plan to repeat CT imaging in 3 weeks Her next dose of chemotherapy after today will be on 12/04/2018

## 2018-11-10 NOTE — Assessment & Plan Note (Signed)
She has persistent abdominal ascites but overall is improving I will order 1 more therapeutic paracentesis next week

## 2018-11-10 NOTE — Assessment & Plan Note (Signed)
Renal function is stable.  We will continue aggressive blood pressure management and other risk factor modifications

## 2018-11-10 NOTE — Progress Notes (Signed)
11/10/18  Per Dr [name] keep Carboplatin dose at 480 mg despite improvement in serum creatinine.  T.O. Dr Gorsuch/Nicole RN/Sayan Aldava Ronnald Ramp PharmD

## 2018-11-10 NOTE — Assessment & Plan Note (Signed)
She does not need blood transfusion today We will proceed with treatment as scheduled

## 2018-11-10 NOTE — Patient Instructions (Addendum)
Paracentesis scheduled for Tuesday March 10th at 10:00.  Arrive to [facility] Radiology dept at 9:45AM.  Do not take morning dose of Metformin.

## 2018-11-14 ENCOUNTER — Encounter (HOSPITAL_COMMUNITY): Payer: Self-pay | Admitting: Physician Assistant

## 2018-11-14 ENCOUNTER — Ambulatory Visit (HOSPITAL_COMMUNITY)
Admission: RE | Admit: 2018-11-14 | Discharge: 2018-11-14 | Disposition: A | Discharge status: Home / Self Care | Payer: Medicare HMO | Source: Ambulatory Visit | Attending: Hematology and Oncology | Admitting: Hematology and Oncology

## 2018-11-14 DIAGNOSIS — C569 Malignant neoplasm of unspecified ovary: Secondary | ICD-10-CM | POA: Insufficient documentation

## 2018-11-14 DIAGNOSIS — R180 Malignant ascites: Secondary | ICD-10-CM | POA: Diagnosis present

## 2018-11-14 DIAGNOSIS — R188 Other ascites: Secondary | ICD-10-CM | POA: Diagnosis not present

## 2018-11-14 HISTORY — PX: IR PARACENTESIS: IMG2679

## 2018-11-14 MED ORDER — LIDOCAINE HCL 1 % IJ SOLN
INTRAMUSCULAR | Status: AC | PRN
  Administered 2018-11-14: 10 mL

## 2018-11-14 MED ORDER — LIDOCAINE HCL 1 % IJ SOLN
INTRAMUSCULAR | Status: AC
  Filled 2018-11-14: qty 20

## 2018-11-14 NOTE — Procedures (Signed)
PROCEDURE SUMMARY:  Successful image-guided paracentesis from the right lower abdomen.  Yielded 2.2 liters of clear yellow fluid.  No immediate complications.  EBL: zero Patient tolerated well.   Specimen was sent for labs.  Please see imaging section of Epic for full dictation.  Joaquim Nam PA-C 11/14/2018 10:49 AM

## 2018-11-24 ENCOUNTER — Telehealth: Payer: Self-pay | Admitting: Hematology and Oncology

## 2018-11-24 NOTE — Telephone Encounter (Signed)
Per 3/20 los moved lab/port from 3/27 to 3/26 to coordinate with ct. Spoke with patient confirming change and appointment times for 3/26 and 3/27.

## 2018-11-30 ENCOUNTER — Telehealth: Payer: Self-pay

## 2018-11-30 ENCOUNTER — Inpatient Hospital Stay: Payer: Medicare HMO

## 2018-11-30 ENCOUNTER — Ambulatory Visit (HOSPITAL_COMMUNITY)
Admission: RE | Admit: 2018-11-30 | Discharge: 2018-11-30 | Disposition: A | Discharge status: Home / Self Care | Payer: Medicare HMO | Source: Ambulatory Visit | Attending: Hematology and Oncology | Admitting: Hematology and Oncology

## 2018-11-30 ENCOUNTER — Other Ambulatory Visit: Payer: Self-pay

## 2018-11-30 DIAGNOSIS — C569 Malignant neoplasm of unspecified ovary: Secondary | ICD-10-CM

## 2018-11-30 DIAGNOSIS — D630 Anemia in neoplastic disease: Secondary | ICD-10-CM | POA: Diagnosis not present

## 2018-11-30 DIAGNOSIS — K429 Umbilical hernia without obstruction or gangrene: Secondary | ICD-10-CM | POA: Insufficient documentation

## 2018-11-30 DIAGNOSIS — R590 Localized enlarged lymph nodes: Secondary | ICD-10-CM | POA: Diagnosis not present

## 2018-11-30 DIAGNOSIS — R188 Other ascites: Secondary | ICD-10-CM | POA: Diagnosis not present

## 2018-11-30 DIAGNOSIS — N183 Chronic kidney disease, stage 3 (moderate): Secondary | ICD-10-CM | POA: Diagnosis not present

## 2018-11-30 DIAGNOSIS — K4090 Unilateral inguinal hernia, without obstruction or gangrene, not specified as recurrent: Secondary | ICD-10-CM | POA: Diagnosis not present

## 2018-11-30 DIAGNOSIS — Z7982 Long term (current) use of aspirin: Secondary | ICD-10-CM | POA: Diagnosis not present

## 2018-11-30 DIAGNOSIS — C786 Secondary malignant neoplasm of retroperitoneum and peritoneum: Secondary | ICD-10-CM | POA: Diagnosis not present

## 2018-11-30 DIAGNOSIS — R180 Malignant ascites: Secondary | ICD-10-CM | POA: Diagnosis not present

## 2018-11-30 DIAGNOSIS — Z5111 Encounter for antineoplastic chemotherapy: Secondary | ICD-10-CM | POA: Diagnosis not present

## 2018-11-30 DIAGNOSIS — K5730 Diverticulosis of large intestine without perforation or abscess without bleeding: Secondary | ICD-10-CM | POA: Insufficient documentation

## 2018-11-30 DIAGNOSIS — C482 Malignant neoplasm of peritoneum, unspecified: Secondary | ICD-10-CM | POA: Diagnosis not present

## 2018-11-30 DIAGNOSIS — K449 Diaphragmatic hernia without obstruction or gangrene: Secondary | ICD-10-CM | POA: Diagnosis not present

## 2018-11-30 DIAGNOSIS — Z79899 Other long term (current) drug therapy: Secondary | ICD-10-CM | POA: Diagnosis not present

## 2018-11-30 DIAGNOSIS — Z7984 Long term (current) use of oral hypoglycemic drugs: Secondary | ICD-10-CM | POA: Diagnosis not present

## 2018-11-30 LAB — CMP (CANCER CENTER ONLY)
ALBUMIN: 3.6 g/dL (ref 3.5–5.0)
ALT: 18 U/L (ref 0–44)
ANION GAP: 8 (ref 5–15)
AST: 21 U/L (ref 15–41)
Alkaline Phosphatase: 108 U/L (ref 38–126)
BUN: 15 mg/dL (ref 8–23)
CALCIUM: 9.2 mg/dL (ref 8.9–10.3)
CO2: 25 mmol/L (ref 22–32)
Chloride: 108 mmol/L (ref 98–111)
Creatinine: 0.94 mg/dL (ref 0.44–1.00)
GFR, Est AFR Am: >60 mL/min (ref >60)
GFR, Estimated: >60 mL/min (ref >60)
GLUCOSE: 77 mg/dL (ref 70–99)
Potassium: 4.1 mmol/L (ref 3.5–5.1)
SODIUM: 141 mmol/L (ref 135–145)
TOTAL PROTEIN: 6.8 g/dL (ref 6.5–8.1)
Total Bilirubin: 0.3 mg/dL (ref 0.3–1.2)

## 2018-11-30 LAB — CBC WITH DIFFERENTIAL (CANCER CENTER ONLY)
Abs Immature Granulocytes: 0.02 10*3/uL (ref 0.00–0.07)
Basophils Absolute: 0 10*3/uL (ref 0.0–0.1)
Basophils Relative: 1 %
Eosinophils Absolute: 0.1 10*3/uL (ref 0.0–0.5)
Eosinophils Relative: 2 %
HCT: 31.4 % — ABNORMAL LOW (ref 36.0–46.0)
Hemoglobin: 9.8 g/dL — ABNORMAL LOW (ref 12.0–15.0)
Immature Granulocytes: 1 %
Lymphocytes Relative: 26 %
Lymphs Abs: 1.1 10*3/uL (ref 0.7–4.0)
MCH: 28.2 pg (ref 26.0–34.0)
MCHC: 31.2 g/dL (ref 30.0–36.0)
MCV: 90.5 fL (ref 80.0–100.0)
MONOS PCT: 8 %
Monocytes Absolute: 0.4 10*3/uL (ref 0.1–1.0)
Neutro Abs: 2.7 10*3/uL (ref 1.7–7.7)
Neutrophils Relative %: 62 %
Platelet Count: 188 10*3/uL (ref 150–400)
RBC: 3.47 MIL/uL — ABNORMAL LOW (ref 3.87–5.11)
RDW: 18.3 % — ABNORMAL HIGH (ref 11.5–15.5)
WBC Count: 4.3 10*3/uL (ref 4.0–10.5)
nRBC: 0 % (ref 0.0–0.2)

## 2018-11-30 LAB — SAMPLE TO BLOOD BANK

## 2018-11-30 MED ORDER — IOHEXOL 300 MG/ML  SOLN
100.0000 mL | Freq: Once | INTRAMUSCULAR | Status: AC | PRN
  Administered 2018-11-30: 100 mL via INTRAVENOUS

## 2018-11-30 MED ORDER — SODIUM CHLORIDE (PF) 0.9 % IJ SOLN
INTRAMUSCULAR | Status: AC
  Filled 2018-11-30: qty 50

## 2018-11-30 MED ORDER — SODIUM CHLORIDE 0.9% FLUSH
10.0000 mL | Freq: Once | INTRAVENOUS | Status: AC
  Administered 2018-11-30: 10 mL
  Filled 2018-11-30: qty 10

## 2018-11-30 NOTE — Telephone Encounter (Signed)
LVM for pt informing her no need for blood transfusion tomorrow per Dr [name] and reminded of appt's next week with Dr [name]

## 2018-11-30 NOTE — Telephone Encounter (Signed)
Pt agreeable to leaving Port accessed for her return on Monday.  Pt instructed on proper care of accessed port.  Pt verbalizes understanding.

## 2018-11-30 NOTE — Progress Notes (Signed)
Spoke with Lurlean Horns at the cancer center regarding leaving patient's port accessed for her chemo infusion tomorrow, 12-01-2018, after her CT scan. Due to short supply, Stanton Kidney stated to leave accessed per Dr. [name]

## 2018-11-30 NOTE — Telephone Encounter (Signed)
lvm for pt to return call °

## 2018-12-01 ENCOUNTER — Inpatient Hospital Stay: Payer: Medicare HMO

## 2018-12-01 ENCOUNTER — Other Ambulatory Visit: Payer: Medicare HMO

## 2018-12-01 ENCOUNTER — Ambulatory Visit (HOSPITAL_COMMUNITY): Payer: Medicare HMO

## 2018-12-01 LAB — CA 125: Cancer Antigen (CA) 125: 527 U/mL — ABNORMAL HIGH (ref 0.0–38.1)

## 2018-12-04 ENCOUNTER — Other Ambulatory Visit: Payer: Self-pay | Admitting: Hematology and Oncology

## 2018-12-04 ENCOUNTER — Encounter: Payer: Self-pay | Admitting: Hematology and Oncology

## 2018-12-04 ENCOUNTER — Other Ambulatory Visit: Payer: Self-pay

## 2018-12-04 ENCOUNTER — Inpatient Hospital Stay (HOSPITAL_BASED_OUTPATIENT_CLINIC_OR_DEPARTMENT_OTHER): Payer: Medicare HMO | Admitting: Hematology and Oncology

## 2018-12-04 ENCOUNTER — Inpatient Hospital Stay: Payer: Medicare HMO

## 2018-12-04 DIAGNOSIS — N183 Chronic kidney disease, stage 3 unspecified: Secondary | ICD-10-CM

## 2018-12-04 DIAGNOSIS — C786 Secondary malignant neoplasm of retroperitoneum and peritoneum: Secondary | ICD-10-CM | POA: Diagnosis not present

## 2018-12-04 DIAGNOSIS — Z7982 Long term (current) use of aspirin: Secondary | ICD-10-CM | POA: Diagnosis not present

## 2018-12-04 DIAGNOSIS — Z79899 Other long term (current) drug therapy: Secondary | ICD-10-CM

## 2018-12-04 DIAGNOSIS — Z5111 Encounter for antineoplastic chemotherapy: Secondary | ICD-10-CM | POA: Diagnosis not present

## 2018-12-04 DIAGNOSIS — C569 Malignant neoplasm of unspecified ovary: Secondary | ICD-10-CM

## 2018-12-04 DIAGNOSIS — R180 Malignant ascites: Secondary | ICD-10-CM

## 2018-12-04 DIAGNOSIS — Z7984 Long term (current) use of oral hypoglycemic drugs: Secondary | ICD-10-CM | POA: Diagnosis not present

## 2018-12-04 DIAGNOSIS — D630 Anemia in neoplastic disease: Secondary | ICD-10-CM

## 2018-12-04 MED ORDER — SODIUM CHLORIDE 0.9% FLUSH
10.0000 mL | INTRAVENOUS | Status: DC | PRN
  Administered 2018-12-04: 10 mL
  Filled 2018-12-04: qty 10

## 2018-12-04 MED ORDER — DIPHENHYDRAMINE HCL 50 MG/ML IJ SOLN
INTRAMUSCULAR | Status: AC
  Filled 2018-12-04: qty 1

## 2018-12-04 MED ORDER — SODIUM CHLORIDE 0.9 % IV SOLN
140.0000 mg/m2 | Freq: Once | INTRAVENOUS | Status: AC
  Administered 2018-12-04: 270 mg via INTRAVENOUS
  Filled 2018-12-04: qty 45

## 2018-12-04 MED ORDER — SODIUM CHLORIDE 0.9 % IV SOLN
20.0000 mg | Freq: Once | INTRAVENOUS | Status: AC
  Administered 2018-12-04: 20 mg via INTRAVENOUS
  Filled 2018-12-04: qty 2

## 2018-12-04 MED ORDER — SODIUM CHLORIDE 0.9 % IV SOLN
466.5000 mg | Freq: Once | INTRAVENOUS | Status: AC
  Administered 2018-12-04: 470 mg via INTRAVENOUS
  Filled 2018-12-04: qty 47

## 2018-12-04 MED ORDER — FAMOTIDINE IN NACL 20-0.9 MG/50ML-% IV SOLN: 20.0000 mg | Freq: Once | INTRAVENOUS | Status: DC

## 2018-12-04 MED ORDER — SODIUM CHLORIDE 0.9 % IV SOLN
Freq: Once | INTRAVENOUS | Status: AC
  Administered 2018-12-04: 10:00:00 via INTRAVENOUS
  Filled 2018-12-04: qty 250

## 2018-12-04 MED ORDER — DIPHENHYDRAMINE HCL 50 MG/ML IJ SOLN
50.0000 mg | Freq: Once | INTRAMUSCULAR | Status: AC
  Administered 2018-12-04: 50 mg via INTRAVENOUS

## 2018-12-04 MED ORDER — SODIUM CHLORIDE 0.9 % IV SOLN
Freq: Once | INTRAVENOUS | Status: AC
  Administered 2018-12-04: 11:00:00 via INTRAVENOUS
  Filled 2018-12-04: qty 5

## 2018-12-04 MED ORDER — PALONOSETRON HCL INJECTION 0.25 MG/5ML
INTRAVENOUS | Status: AC
  Filled 2018-12-04: qty 5

## 2018-12-04 MED ORDER — HEPARIN SOD (PORK) LOCK FLUSH 100 UNIT/ML IV SOLN
500.0000 [IU] | Freq: Once | INTRAVENOUS | Status: AC | PRN
  Administered 2018-12-04: 500 [IU]
  Filled 2018-12-04: qty 5

## 2018-12-04 MED ORDER — PALONOSETRON HCL INJECTION 0.25 MG/5ML
0.2500 mg | Freq: Once | INTRAVENOUS | Status: AC
  Administered 2018-12-04: 0.25 mg via INTRAVENOUS

## 2018-12-04 NOTE — Patient Instructions (Signed)
[facility] Discharge Instructions for Patients Receiving Chemotherapy  Today you received the following chemotherapy agents Paclitaxel (TAXOL) & Carboplatin (PARAPLATIN).  To help prevent nausea and vomiting after your treatment, we encourage you to take your nausea medication as prescribed.  If you develop nausea and vomiting that is not controlled by your nausea medication, call the clinic.   BELOW ARE SYMPTOMS THAT SHOULD BE REPORTED IMMEDIATELY:  *FEVER GREATER THAN 100.5 F  *CHILLS WITH OR WITHOUT FEVER  NAUSEA AND VOMITING THAT IS NOT CONTROLLED WITH YOUR NAUSEA MEDICATION  *UNUSUAL SHORTNESS OF BREATH  *UNUSUAL BRUISING OR BLEEDING  TENDERNESS IN MOUTH AND THROAT WITH OR WITHOUT PRESENCE OF ULCERS  *URINARY PROBLEMS  *BOWEL PROBLEMS  UNUSUAL RASH Items with * indicate a potential emergency and should be followed up as soon as possible.  Feel free to call the clinic should you have any questions or concerns. The clinic phone number is [phone].  Please show the CHEMO ALERT CARD at check-in to the Emergency Department and triage nurse.   

## 2018-12-04 NOTE — Assessment & Plan Note (Signed)
Her renal function is back to normal The calculated carboplatin dose will be higher but I plan to keep it at about the same dose as before AUC of 5.

## 2018-12-04 NOTE — Assessment & Plan Note (Signed)
She is not symptomatic I recommend observation only I will call her at the end of the week to see how she feels and whether further therapeutic paracentesis is needed

## 2018-12-04 NOTE — Assessment & Plan Note (Signed)
I have reviewed CT imaging with the patient She has excellent response to therapy I recommend her to complete 6 cycles of chemotherapy before consideration for interval debulking surgery She has mild persistent ascites present but not symptomatic I will call her at the end of the week to see if she felt uncomfortable and need therapeutic paracentesis.

## 2018-12-04 NOTE — Progress Notes (Signed)
[facility] OFFICE PROGRESS NOTE  Patient Care Team: Caryl Bis, MD as PCP - General (Unknown Physician Specialty)  ASSESSMENT & PLAN:  Ovarian cancer [facility]) I have reviewed CT imaging with the patient She has excellent response to therapy I recommend her to complete 6 cycles of chemotherapy before consideration for interval debulking surgery She has mild persistent ascites present but not symptomatic I will call her at the end of the week to see if she felt uncomfortable and need therapeutic paracentesis.  Malignant ascites She is not symptomatic I recommend observation only I will call her at the end of the week to see how she feels and whether further therapeutic paracentesis is needed  Anemia in neoplastic disease Her anemia is stable.  She is not symptomatic Observe only for now  CKD (chronic kidney disease), stage III ([place]) Her renal function is back to normal The calculated carboplatin dose will be higher but I plan to keep it at about the same dose as before AUC of 5.   No orders of the defined types were placed in this encounter.   INTERVAL HISTORY: Please see below for problem oriented charting. She returns for further follow-up She feels well She has persistent ascites but it does not bother her Denies peripheral neuropathy No recent nausea or vomiting No recent infection, fever or chills  SUMMARY OF ONCOLOGIC HISTORY: Oncology History   MMR normal NTRK normal     Ovarian cancer ([place])   08/25/2018 Initial Diagnosis    Initially presented with nausea, vomiting and abdominal distension    08/25/2018 Imaging    CT scan demonstrated right pleural thickening and a cardiophrenic lymph node measuring 1.5 cm.  The liver demonstrated mild nodularity and scalloping of the right lobe.  There were 2 less than 1 cm lesions seen within the liver.  There was large volume ascites and a 13 cm upper abdominal left upper quadrant omental cake visualized.   There was extensive peritoneal thickening along the abdomen and pelvic peritoneum.  There is a 1.5 cm aortocaval lymph node identified.  No pelvic adenopathy was identified.  In the pelvis the left adnexa revealed a 4.6 x 3.2 cm ill-defined solid and cystic structure.  No other large dominant pelvic masses identified.  A moderate hiatal hernia was seen and a small umbilical hernia containing fat was seen.    08/29/2018 Tumor Marker    Patient's tumor was tested for the following markers: CA-125. Results of the tumor marker test revealed 1060    09/15/2018 Cancer Staging    Staging form: Ovary, Fallopian Tube, and Primary Peritoneal Carcinoma, AJCC 8th Edition - Clinical: Stage IV (cT3c, cN1, cM1) - Signed by Heath Lark, MD on 09/15/2018    09/15/2018 Tumor Marker    Patient's tumor was tested for the following markers: CA-125. Results of the tumor marker test revealed 1491    09/25/2018 Pathology Results    Omentum, biopsy - HIGH GRADE CARCINOMA, CONSISTENT WITH PAPILLARY SEROUS CARCINOMA. - SEE COMMENT. Microscopic Comment The malignant cells are positive for cytokeratin 7, estrogen receptor, p53, pax-8, and wt-1. They are negative for cytokeratin 20 and CDX-2. The histology, in conjunction with this immunohistochemical profile, supports the above diagnosis. Dr. Vicente Males has reviewed the case and concurs with this interpretation.     09/25/2018 Procedure    Successful 8 French right internal jugular vein power port placement with its tip at the SVC/RA junction.     09/26/2018 Imaging    1.  Examination is positive for nodal metastasis within the anterior and posterior mediastinum as well as right CP angle. 2. There is a small amount of right pleural fluid and pleural soft tissue nodularity which may reflect pleural involvement by tumor. 3. Large volume of ascites with evidence of peritoneal carcinomatosis with omental caking. 4. Aortic Atherosclerosis (ICD10-I70.0) and Emphysema  (ICD10-J43.9). 5. Multi vessel coronary artery atherosclerotic calcifications including left main disease     Genetic Testing    Patient has genetic testing done for MMR. Results revealed MMR - normal.    09/29/2018 -  Chemotherapy    The patient had carboplatin and taxol    10/20/2018 Tumor Marker    Patient's tumor was tested for the following markers: CA-125. Results of the tumor marker test revealed 2146    10/25/2018 Procedure    Successful ultrasound-guided therapeutic paracentesis yielding 5 liters of peritoneal fluid.     11/14/2018 Procedure    Successful ultrasound-guided paracentesis yielding 2.2 liters of peritoneal fluid.    11/30/2018 Imaging    IMPRESSION: 1. Significant decrease in peritoneal carcinomatosis since previous study. Ascites has mildly increased. 2. Stable left adnexal mass. 3. Stable mild mediastinal lymphadenopathy. 4. No new or progressive metastatic disease identified. 5. Small to moderate hiatal hernia. Small paraumbilical and right inguinal hernias. 6. Colonic diverticulosis, without radiographic evidence of diverticulitis.    12/01/2018 Tumor Marker    Patient's tumor was tested for the following markers: CA-125. Results of the tumor marker test revealed 527     REVIEW OF SYSTEMS:   Constitutional: Denies fevers, chills or abnormal weight loss Eyes: Denies blurriness of vision Ears, nose, mouth, throat, and face: Denies mucositis or sore throat Respiratory: Denies cough, dyspnea or wheezes Cardiovascular: Denies palpitation, chest discomfort or lower extremity swelling Gastrointestinal:  Denies nausea, heartburn or change in bowel habits Skin: Denies abnormal skin rashes Lymphatics: Denies new lymphadenopathy or easy bruising Neurological:Denies numbness, tingling or new weaknesses Behavioral/Psych: Mood is stable, no new changes  All other systems were reviewed with the patient and are negative.  I have reviewed the past medical history,  past surgical history, social history and family history with the patient and they are unchanged from previous note.  ALLERGIES:  has No Known Allergies.  MEDICATIONS:  Current Outpatient Medications  Medication Sig Dispense Refill  . allopurinol (ZYLOPRIM) 300 MG tablet Take 300 mg by mouth daily.    Marland Kitchen aspirin EC 81 MG tablet Take 81 mg by mouth daily.    Marland Kitchen atorvastatin (LIPITOR) 80 MG tablet Take 80 mg by mouth daily.    . benazepril (LOTENSIN) 20 MG tablet Take 20 mg by mouth daily.    . Cholecalciferol (VITAMIN D3 PO) Take 1,000 Units by mouth daily.    . cloNIDine (CATAPRES) 0.2 MG tablet Take 0.2 mg by mouth 2 (two) times daily.    Marland Kitchen dexamethasone (DECADRON) 4 MG tablet Take 2 tabs at the night before and 2 tab the morning of chemotherapy, every 3 weeks, by mouth 24 tablet 0  . diltiazem (CARDIZEM CD) 300 MG 24 hr capsule Take 300 mg by mouth daily.    Marland Kitchen levothyroxine (SYNTHROID, LEVOTHROID) 100 MCG tablet Take 100 mcg by mouth daily before breakfast.    . lidocaine-prilocaine (EMLA) cream Apply to affected area once 30 g 3  . metFORMIN (GLUCOPHAGE-XR) 500 MG 24 hr tablet Take 2,000 mg by mouth daily with breakfast.    . ondansetron (ZOFRAN) 8 MG tablet Take 1 tablet (8 mg total) by  mouth every 8 (eight) hours as needed for refractory nausea / vomiting. Start on day 3 after chemo. 30 tablet 1  . prochlorperazine (COMPAZINE) 10 MG tablet Take 1 tablet (10 mg total) by mouth every 6 (six) hours as needed (Nausea or vomiting). 30 tablet 1   No current facility-administered medications for this visit.    Facility-Administered Medications Ordered in Other Visits  Medication Dose Route Frequency Provider Last Rate Last Dose  . CARBOplatin (PARAPLATIN) 470 mg in sodium chloride 0.9 % 250 mL chemo infusion  470 mg Intravenous Once [name], Jenavie Stanczak, MD      . heparin lock flush 100 unit/mL  500 Units Intracatheter Once PRN [name], Mechel Schutter, MD      . PACLitaxel (TAXOL) 270 mg in sodium chloride 0.9 %  250 mL chemo infusion (> 49m/m2)  140 mg/m2 (Treatment Plan Recorded) Intravenous Once Dary Dilauro, MD      . sodium chloride flush (NS) 0.9 % injection 10 mL  10 mL Intracatheter PRN GAlvy Bimler Kalina Morabito, MD        PHYSICAL EXAMINATION: ECOG PERFORMANCE STATUS: 1 - Symptomatic but completely ambulatory  Vitals:   12/04/18 0922  BP: (!) 131/55  Pulse: 81  Resp: 18  Temp: 98.3 F (36.8 C)  SpO2: 100%   Filed Weights   12/04/18 0922  Weight: 160 lb 12.8 oz (72.9 kg)    GENERAL:alert, no distress and comfortable SKIN: skin color, texture, turgor are normal, no rashes or significant lesions EYES: normal, Conjunctiva are pink and non-injected, sclera clear OROPHARYNX:no exudate, no erythema and lips, buccal mucosa, and tongue normal  NECK: supple, thyroid normal size, non-tender, without nodularity LYMPH:  no palpable lymphadenopathy in the cervical, axillary or inguinal LUNGS: clear to auscultation and percussion with normal breathing effort HEART: regular rate & rhythm and no murmurs and no lower extremity edema ABDOMEN:abdomen soft, non-tender and normal bowel sounds.  Her abdomen is distended with ascites Musculoskeletal:no cyanosis of digits and no clubbing  NEURO: alert & oriented x 3 with fluent speech, no focal motor/sensory deficits  LABORATORY DATA:  I have reviewed the data as listed    Component Value Date/Time   NA 141 11/30/2018 0825   K 4.1 11/30/2018 0825   CL 108 11/30/2018 0825   CO2 25 11/30/2018 0825   GLUCOSE 77 11/30/2018 0825   BUN 15 11/30/2018 0825   CREATININE 0.94 11/30/2018 0825   CALCIUM 9.2 11/30/2018 0825   PROT 6.8 11/30/2018 0825   ALBUMIN 3.6 11/30/2018 0825   AST 21 11/30/2018 0825   ALT 18 11/30/2018 0825   ALKPHOS 108 11/30/2018 0825   BILITOT 0.3 11/30/2018 0825   GFRNONAA >60 11/30/2018 0825   GFRAA >60 11/30/2018 0825    No results found for: SPEP, UPEP  Lab Results  Component Value Date   WBC 4.3 11/30/2018   NEUTROABS 2.7  11/30/2018   HGB 9.8 (L) 11/30/2018   HCT 31.4 (L) 11/30/2018   MCV 90.5 11/30/2018   PLT 188 11/30/2018      Chemistry      Component Value Date/Time   NA 141 11/30/2018 0825   K 4.1 11/30/2018 0825   CL 108 11/30/2018 0825   CO2 25 11/30/2018 0825   BUN 15 11/30/2018 0825   CREATININE 0.94 11/30/2018 0825      Component Value Date/Time   CALCIUM 9.2 11/30/2018 0825   ALKPHOS 108 11/30/2018 0825   AST 21 11/30/2018 0825   ALT 18 11/30/2018 0825   BILITOT 0.3  11/30/2018 0825       RADIOGRAPHIC STUDIES: I have reviewed multiple imaging studies with the patient I have personally reviewed the radiological images as listed and agreed with the findings in the report. Ct Chest W Contrast  Result Date: 11/30/2018 CLINICAL DATA:  Follow-up metastatic ovarian carcinoma. Ongoing chemotherapy. Personal history of lung carcinoma. EXAM: CT CHEST, ABDOMEN, AND PELVIS WITH CONTRAST TECHNIQUE: Multidetector CT imaging of the chest, abdomen and pelvis was performed following the standard protocol during bolus administration of intravenous contrast. CONTRAST:  133m OMNIPAQUE IOHEXOL 300 MG/ML  SOLN COMPARISON:  Chest CT 09/26/2018 and AP CT on 08/25/2018 FINDINGS: CT CHEST FINDINGS Cardiovascular: No acute findings. Mediastinum/Lymph Nodes: Stable mild mediastinal lymphadenopathy, with largest lymph node in the prevascular space measuring 1.4 cm. Mild lymphadenopathy right cardiophrenic angle is also stable. No new or increased lymphadenopathy identified. Lungs/Pleura: Stable right pleural thickening, without evidence effusion. No suspicious pulmonary nodules masses identified. No evidence of pulmonary infiltrate. Mild centrilobular emphysema again noted. Musculoskeletal:  No suspicious bone lesions identified. CT ABDOMEN AND PELVIS FINDINGS Hepatobiliary: Tiny sub-cm lesions in right hepatic lobe are stable but remain too small to characterize. These likely represent tiny cysts. No definite liver  masses identified. Gallbladder is unremarkable. Pancreas:  No mass or inflammatory changes. Spleen:  Within normal limits in size and appearance. Adrenals/Urinary tract: Stable bilateral renal cysts. No masses or hydronephrosis. Stomach/Bowel: Small to moderate hiatal hernia no evidence of obstruction, inflammatory process, or abnormal fluid collections. Diverticulosis is seen mainly involving the sigmoid colon, however there is no evidence of diverticulitis. Large amount of stool noted in the rectosigmoid colon. Vascular/Lymphatic: No pathologically enlarged lymph nodes identified. No abdominal aortic aneurysm. Aortic atherosclerosis. Reproductive: Soft tissue mass in the left adnexa measures 4.4 x 3.2 cm on image 95/2, without significant change compared to prior study. Moderate ascites shows mild increase since previous study, however there is been significant decrease in omental soft tissue caking and peritoneal thickening in the pelvis, consistent with decreased peritoneal carcinomatosis. Other: Stable small paraumbilical and right inguinal hernias containing ascites. Musculoskeletal:  No suspicious bone lesions identified. IMPRESSION: 1. Significant decrease in peritoneal carcinomatosis since previous study. Ascites has mildly increased. 2. Stable left adnexal mass. 3. Stable mild mediastinal lymphadenopathy. 4. No new or progressive metastatic disease identified. 5. Small to moderate hiatal hernia. Small paraumbilical and right inguinal hernias. 6. Colonic diverticulosis, without radiographic evidence of diverticulitis. Electronically Signed   By: JEarle GellM.D.   On: 11/30/2018 12:08   Ct Abdomen Pelvis W Contrast  Result Date: 11/30/2018 CLINICAL DATA:  Follow-up metastatic ovarian carcinoma. Ongoing chemotherapy. Personal history of lung carcinoma. EXAM: CT CHEST, ABDOMEN, AND PELVIS WITH CONTRAST TECHNIQUE: Multidetector CT imaging of the chest, abdomen and pelvis was performed following the  standard protocol during bolus administration of intravenous contrast. CONTRAST:  1036mOMNIPAQUE IOHEXOL 300 MG/ML  SOLN COMPARISON:  Chest CT 09/26/2018 and AP CT on 08/25/2018 FINDINGS: CT CHEST FINDINGS Cardiovascular: No acute findings. Mediastinum/Lymph Nodes: Stable mild mediastinal lymphadenopathy, with largest lymph node in the prevascular space measuring 1.4 cm. Mild lymphadenopathy right cardiophrenic angle is also stable. No new or increased lymphadenopathy identified. Lungs/Pleura: Stable right pleural thickening, without evidence effusion. No suspicious pulmonary nodules masses identified. No evidence of pulmonary infiltrate. Mild centrilobular emphysema again noted. Musculoskeletal:  No suspicious bone lesions identified. CT ABDOMEN AND PELVIS FINDINGS Hepatobiliary: Tiny sub-cm lesions in right hepatic lobe are stable but remain too small to characterize. These likely represent tiny cysts. No definite  liver masses identified. Gallbladder is unremarkable. Pancreas:  No mass or inflammatory changes. Spleen:  Within normal limits in size and appearance. Adrenals/Urinary tract: Stable bilateral renal cysts. No masses or hydronephrosis. Stomach/Bowel: Small to moderate hiatal hernia no evidence of obstruction, inflammatory process, or abnormal fluid collections. Diverticulosis is seen mainly involving the sigmoid colon, however there is no evidence of diverticulitis. Large amount of stool noted in the rectosigmoid colon. Vascular/Lymphatic: No pathologically enlarged lymph nodes identified. No abdominal aortic aneurysm. Aortic atherosclerosis. Reproductive: Soft tissue mass in the left adnexa measures 4.4 x 3.2 cm on image 95/2, without significant change compared to prior study. Moderate ascites shows mild increase since previous study, however there is been significant decrease in omental soft tissue caking and peritoneal thickening in the pelvis, consistent with decreased peritoneal carcinomatosis.  Other: Stable small paraumbilical and right inguinal hernias containing ascites. Musculoskeletal:  No suspicious bone lesions identified. IMPRESSION: 1. Significant decrease in peritoneal carcinomatosis since previous study. Ascites has mildly increased. 2. Stable left adnexal mass. 3. Stable mild mediastinal lymphadenopathy. 4. No new or progressive metastatic disease identified. 5. Small to moderate hiatal hernia. Small paraumbilical and right inguinal hernias. 6. Colonic diverticulosis, without radiographic evidence of diverticulitis. Electronically Signed   By: Earle Gell M.D.   On: 11/30/2018 12:08   Ir Paracentesis  Result Date: 11/14/2018 INDICATION: Patient with history of ovarian cancer, ascites. Request for therapeutic paracentesis today in IR. EXAM: ULTRASOUND GUIDED PARACENTESIS PARACENTESIS MEDICATIONS: 13 mL 1% lidocaine. COMPLICATIONS: None immediate. PROCEDURE: Informed written consent was obtained from the patient after a discussion of the risks, benefits and alternatives to treatment. A timeout was performed prior to the initiation of the procedure. Initial ultrasound scanning demonstrates a moderate amount of ascites within the right lower abdominal quadrant. The right lower abdomen was prepped and draped in the usual sterile fashion. 1% lidocaine was used for local anesthesia. Following this, a 19 gauge, 7-cm, Yueh catheter was introduced. An ultrasound image was saved for documentation purposes. The paracentesis was performed. The catheter was removed and a dressing was applied. The patient tolerated the procedure well without immediate post procedural complication. FINDINGS: A total of approximately 2.2 L of clear yellow fluid was removed. IMPRESSION: Successful ultrasound-guided paracentesis yielding 2.2 liters of peritoneal fluid. Read by Candiss Norse, PA-C Electronically Signed   By: Jacqulynn Cadet M.D.   On: 11/14/2018 11:10    All questions were answered. The patient knows  to call the clinic with any problems, questions or concerns. No barriers to learning was detected.  I spent 25 minutes counseling the patient face to face. The total time spent in the appointment was 30 minutes and more than 50% was on counseling and review of test results  Heath Lark, MD 12/04/2018 11:24 AM

## 2018-12-04 NOTE — Assessment & Plan Note (Signed)
Her anemia is stable.  She is not symptomatic Observe only for now

## 2018-12-08 ENCOUNTER — Telehealth: Payer: Self-pay | Admitting: *Deleted

## 2018-12-08 NOTE — Telephone Encounter (Signed)
Telephone call to patient to follow up on any symptoms. Patient reports she is feeling well and does not need any interventions at this time. Patient understands to call this office if she starts to feel she will need a paracentesis. Confirmed appointments for 4/20

## 2018-12-14 DIAGNOSIS — E1165 Type 2 diabetes mellitus with hyperglycemia: Secondary | ICD-10-CM | POA: Diagnosis not present

## 2018-12-14 DIAGNOSIS — E782 Mixed hyperlipidemia: Secondary | ICD-10-CM | POA: Diagnosis not present

## 2018-12-14 DIAGNOSIS — D638 Anemia in other chronic diseases classified elsewhere: Secondary | ICD-10-CM | POA: Diagnosis not present

## 2018-12-14 DIAGNOSIS — E1122 Type 2 diabetes mellitus with diabetic chronic kidney disease: Secondary | ICD-10-CM | POA: Diagnosis not present

## 2018-12-14 DIAGNOSIS — E039 Hypothyroidism, unspecified: Secondary | ICD-10-CM | POA: Diagnosis not present

## 2018-12-14 DIAGNOSIS — I10 Essential (primary) hypertension: Secondary | ICD-10-CM | POA: Diagnosis not present

## 2018-12-20 DIAGNOSIS — E1169 Type 2 diabetes mellitus with other specified complication: Secondary | ICD-10-CM | POA: Diagnosis not present

## 2018-12-20 DIAGNOSIS — I10 Essential (primary) hypertension: Secondary | ICD-10-CM | POA: Diagnosis not present

## 2018-12-20 DIAGNOSIS — Z6825 Body mass index (BMI) 25.0-25.9, adult: Secondary | ICD-10-CM | POA: Diagnosis not present

## 2018-12-20 DIAGNOSIS — E1122 Type 2 diabetes mellitus with diabetic chronic kidney disease: Secondary | ICD-10-CM | POA: Diagnosis not present

## 2018-12-20 DIAGNOSIS — E782 Mixed hyperlipidemia: Secondary | ICD-10-CM | POA: Diagnosis not present

## 2018-12-20 DIAGNOSIS — R180 Malignant ascites: Secondary | ICD-10-CM | POA: Diagnosis not present

## 2018-12-20 DIAGNOSIS — D630 Anemia in neoplastic disease: Secondary | ICD-10-CM | POA: Diagnosis not present

## 2018-12-20 DIAGNOSIS — C7962 Secondary malignant neoplasm of left ovary: Secondary | ICD-10-CM | POA: Diagnosis not present

## 2018-12-25 ENCOUNTER — Inpatient Hospital Stay: Payer: Medicare HMO

## 2018-12-25 ENCOUNTER — Other Ambulatory Visit: Payer: Self-pay

## 2018-12-25 ENCOUNTER — Inpatient Hospital Stay: Payer: Medicare HMO | Attending: Gynecologic Oncology

## 2018-12-25 ENCOUNTER — Encounter: Payer: Self-pay | Admitting: Hematology and Oncology

## 2018-12-25 ENCOUNTER — Inpatient Hospital Stay (HOSPITAL_BASED_OUTPATIENT_CLINIC_OR_DEPARTMENT_OTHER): Payer: Medicare HMO | Admitting: Hematology and Oncology

## 2018-12-25 DIAGNOSIS — D61818 Other pancytopenia: Secondary | ICD-10-CM | POA: Insufficient documentation

## 2018-12-25 DIAGNOSIS — C786 Secondary malignant neoplasm of retroperitoneum and peritoneum: Secondary | ICD-10-CM | POA: Diagnosis not present

## 2018-12-25 DIAGNOSIS — C569 Malignant neoplasm of unspecified ovary: Secondary | ICD-10-CM

## 2018-12-25 DIAGNOSIS — Z7984 Long term (current) use of oral hypoglycemic drugs: Secondary | ICD-10-CM | POA: Diagnosis not present

## 2018-12-25 DIAGNOSIS — N183 Chronic kidney disease, stage 3 unspecified: Secondary | ICD-10-CM

## 2018-12-25 DIAGNOSIS — Z5111 Encounter for antineoplastic chemotherapy: Secondary | ICD-10-CM | POA: Diagnosis not present

## 2018-12-25 DIAGNOSIS — Z7982 Long term (current) use of aspirin: Secondary | ICD-10-CM

## 2018-12-25 DIAGNOSIS — T451X5A Adverse effect of antineoplastic and immunosuppressive drugs, initial encounter: Secondary | ICD-10-CM

## 2018-12-25 DIAGNOSIS — G620 Drug-induced polyneuropathy: Secondary | ICD-10-CM | POA: Diagnosis not present

## 2018-12-25 DIAGNOSIS — T451X5S Adverse effect of antineoplastic and immunosuppressive drugs, sequela: Secondary | ICD-10-CM | POA: Insufficient documentation

## 2018-12-25 DIAGNOSIS — R180 Malignant ascites: Secondary | ICD-10-CM

## 2018-12-25 DIAGNOSIS — Z79899 Other long term (current) drug therapy: Secondary | ICD-10-CM | POA: Insufficient documentation

## 2018-12-25 DIAGNOSIS — Z794 Long term (current) use of insulin: Secondary | ICD-10-CM | POA: Diagnosis not present

## 2018-12-25 LAB — CBC WITH DIFFERENTIAL (CANCER CENTER ONLY)
Abs Immature Granulocytes: 0.02 10*3/uL (ref 0.00–0.07)
Basophils Absolute: 0 10*3/uL (ref 0.0–0.1)
Basophils Relative: 0 %
Eosinophils Absolute: 0 10*3/uL (ref 0.0–0.5)
Eosinophils Relative: 0 %
HCT: 30.9 % — ABNORMAL LOW (ref 36.0–46.0)
Hemoglobin: 10 g/dL — ABNORMAL LOW (ref 12.0–15.0)
Immature Granulocytes: 1 %
Lymphocytes Relative: 14 %
Lymphs Abs: 0.5 10*3/uL — ABNORMAL LOW (ref 0.7–4.0)
MCH: 29 pg (ref 26.0–34.0)
MCHC: 32.4 g/dL (ref 30.0–36.0)
MCV: 89.6 fL (ref 80.0–100.0)
Monocytes Absolute: 0 10*3/uL — ABNORMAL LOW (ref 0.1–1.0)
Monocytes Relative: 1 %
Neutro Abs: 2.9 10*3/uL (ref 1.7–7.7)
Neutrophils Relative %: 84 %
Platelet Count: 191 10*3/uL (ref 150–400)
RBC: 3.45 MIL/uL — ABNORMAL LOW (ref 3.87–5.11)
RDW: 17.2 % — ABNORMAL HIGH (ref 11.5–15.5)
WBC Count: 3.4 10*3/uL — ABNORMAL LOW (ref 4.0–10.5)
nRBC: 0 % (ref 0.0–0.2)

## 2018-12-25 LAB — CMP (CANCER CENTER ONLY)
ALT: 24 U/L (ref 0–44)
AST: 22 U/L (ref 15–41)
Albumin: 3.6 g/dL (ref 3.5–5.0)
Alkaline Phosphatase: 115 U/L (ref 38–126)
Anion gap: 11 (ref 5–15)
BUN: 10 mg/dL (ref 8–23)
CO2: 21 mmol/L — ABNORMAL LOW (ref 22–32)
Calcium: 9.1 mg/dL (ref 8.9–10.3)
Chloride: 108 mmol/L (ref 98–111)
Creatinine: 1.06 mg/dL — ABNORMAL HIGH (ref 0.44–1.00)
GFR, Est AFR Am: >60 mL/min (ref >60)
GFR, Estimated: 54 mL/min — ABNORMAL LOW (ref >60)
Glucose, Bld: 155 mg/dL — ABNORMAL HIGH (ref 70–99)
Potassium: 4.3 mmol/L (ref 3.5–5.1)
Sodium: 140 mmol/L (ref 135–145)
Total Bilirubin: 0.3 mg/dL (ref 0.3–1.2)
Total Protein: 6.8 g/dL (ref 6.5–8.1)

## 2018-12-25 MED ORDER — SODIUM CHLORIDE 0.9 % IV SOLN
20.0000 mg | Freq: Once | INTRAVENOUS | Status: AC
  Administered 2018-12-25: 20 mg via INTRAVENOUS
  Filled 2018-12-25: qty 2

## 2018-12-25 MED ORDER — DIPHENHYDRAMINE HCL 50 MG/ML IJ SOLN
50.0000 mg | Freq: Once | INTRAMUSCULAR | Status: AC
  Administered 2018-12-25: 09:00:00 50 mg via INTRAVENOUS

## 2018-12-25 MED ORDER — PALONOSETRON HCL INJECTION 0.25 MG/5ML
0.2500 mg | Freq: Once | INTRAVENOUS | Status: AC
  Administered 2018-12-25: 0.25 mg via INTRAVENOUS

## 2018-12-25 MED ORDER — SODIUM CHLORIDE 0.9 % IV SOLN
Freq: Once | INTRAVENOUS | Status: AC
  Administered 2018-12-25: 09:00:00 via INTRAVENOUS
  Filled 2018-12-25: qty 250

## 2018-12-25 MED ORDER — SODIUM CHLORIDE 0.9% FLUSH
10.0000 mL | Freq: Once | INTRAVENOUS | Status: AC
  Administered 2018-12-25: 10 mL
  Filled 2018-12-25: qty 10

## 2018-12-25 MED ORDER — FAMOTIDINE IN NACL 20-0.9 MG/50ML-% IV SOLN: 20.0000 mg | Freq: Once | INTRAVENOUS | Status: DC

## 2018-12-25 MED ORDER — SODIUM CHLORIDE 0.9 % IV SOLN
140.0000 mg/m2 | Freq: Once | INTRAVENOUS | Status: AC
  Administered 2018-12-25: 11:00:00 270 mg via INTRAVENOUS
  Filled 2018-12-25: qty 45

## 2018-12-25 MED ORDER — SODIUM CHLORIDE 0.9 % IV SOLN
447.0000 mg | Freq: Once | INTRAVENOUS | Status: AC
  Administered 2018-12-25: 14:00:00 450 mg via INTRAVENOUS
  Filled 2018-12-25: qty 45

## 2018-12-25 MED ORDER — DIPHENHYDRAMINE HCL 50 MG/ML IJ SOLN
INTRAMUSCULAR | Status: AC
  Filled 2018-12-25: qty 1

## 2018-12-25 MED ORDER — PALONOSETRON HCL INJECTION 0.25 MG/5ML
INTRAVENOUS | Status: AC
  Filled 2018-12-25: qty 5

## 2018-12-25 MED ORDER — SODIUM CHLORIDE 0.9% FLUSH
10.0000 mL | INTRAVENOUS | Status: DC | PRN
  Administered 2018-12-25: 15:00:00 10 mL
  Filled 2018-12-25: qty 10

## 2018-12-25 MED ORDER — HEPARIN SOD (PORK) LOCK FLUSH 100 UNIT/ML IV SOLN
500.0000 [IU] | Freq: Once | INTRAVENOUS | Status: AC | PRN
  Administered 2018-12-25: 15:00:00 500 [IU]
  Filled 2018-12-25: qty 5

## 2018-12-25 MED ORDER — SODIUM CHLORIDE 0.9 % IV SOLN
Freq: Once | INTRAVENOUS | Status: AC
  Administered 2018-12-25: 10:00:00 via INTRAVENOUS
  Filled 2018-12-25: qty 5

## 2018-12-25 NOTE — Assessment & Plan Note (Signed)
Her renal function is stable The calculated carboplatin dose will be higher but I plan to keep it at about the same dose as before around  AUC of 5.

## 2018-12-25 NOTE — Assessment & Plan Note (Signed)
She has probable malignant ascites on exam We will proceed with another therapeutic paracentesis this week

## 2018-12-25 NOTE — Patient Instructions (Signed)
[facility] Discharge Instructions for Patients Receiving Chemotherapy  Today you received the following chemotherapy agents Paclitaxel (TAXOL) & Carboplatin (PARAPLATIN).  To help prevent nausea and vomiting after your treatment, we encourage you to take your nausea medication as prescribed.   If you develop nausea and vomiting that is not controlled by your nausea medication, call the clinic.   BELOW ARE SYMPTOMS THAT SHOULD BE REPORTED IMMEDIATELY:  *FEVER GREATER THAN 100.5 F  *CHILLS WITH OR WITHOUT FEVER  NAUSEA AND VOMITING THAT IS NOT CONTROLLED WITH YOUR NAUSEA MEDICATION  *UNUSUAL SHORTNESS OF BREATH  *UNUSUAL BRUISING OR BLEEDING  TENDERNESS IN MOUTH AND THROAT WITH OR WITHOUT PRESENCE OF ULCERS  *URINARY PROBLEMS  *BOWEL PROBLEMS  UNUSUAL RASH Items with * indicate a potential emergency and should be followed up as soon as possible.  Feel free to call the clinic should you have any questions or concerns. The clinic phone number is [phone].  Please show the CHEMO ALERT CARD at check-in to the Emergency Department and triage nurse.  Coronavirus (COVID-19) Are you at risk?  Are you at risk for the Coronavirus (COVID-19)?  To be considered HIGH RISK for Coronavirus (COVID-19), you have to meet the following criteria:  . Traveled to [place], [place], [place], [place] or [place]; or in the [place] to [place], [place], [place], or [place]; and have fever, cough, and shortness of breath within the last 2 weeks of travel OR . Been in close contact with a person diagnosed with COVID-19 within the last 2 weeks and have fever, cough, and shortness of breath . IF YOU DO NOT MEET THESE CRITERIA, YOU ARE CONSIDERED LOW RISK FOR COVID-19.  What to do if you are HIGH RISK for COVID-19?  . If you are having a medical emergency, call 911. . Seek medical care right away. Before you go to a doctor's office, urgent care or emergency department,  call ahead and tell them about your recent travel, contact with someone diagnosed with COVID-19, and your symptoms. You should receive instructions from your physician's office regarding next steps of care.  . When you arrive at healthcare provider, tell the healthcare staff immediately you have returned from visiting [place], [place], [place], [place] or [place]; or traveled in the [place] to [place], [place], [place], or [place]; in the last two weeks or you have been in close contact with a person diagnosed with COVID-19 in the last 2 weeks.   . Tell the health care staff about your symptoms: fever, cough and shortness of breath. . After you have been seen by a medical provider, you will be either: o Tested for (COVID-19) and discharged home on quarantine except to seek medical care if symptoms worsen, and asked to  - Stay home and avoid contact with others until you get your results (4-5 days)  - Avoid travel on public transportation if possible (such as bus, train, or airplane) or o Sent to the Emergency Department by EMS for evaluation, COVID-19 testing, and possible admission depending on your condition and test results.  What to do if you are LOW RISK for COVID-19?  Reduce your risk of any infection by using the same precautions used for avoiding the common cold or flu:  . Wash your hands often with soap and warm water for at least 20 seconds.  If soap and water are not readily available, use an alcohol-based hand sanitizer with at least 60% alcohol.  .   If coughing or sneezing, cover your mouth and nose by coughing or sneezing into the elbow areas of your shirt or coat, into a tissue or into your sleeve (not your hands). . Avoid shaking hands with others and consider head nods or verbal greetings only. . Avoid touching your eyes, nose, or mouth with unwashed hands.  . Avoid close contact with people who are sick. . Avoid places or events with large numbers of people in one  location, like concerts or sporting events. . Carefully consider travel plans you have or are making. . If you are planning any travel outside or inside the [place], visit the CDC's Travelers' Health webpage for the latest health notices. . If you have some symptoms but not all symptoms, continue to monitor at home and seek medical attention if your symptoms worsen. . If you are having a medical emergency, call 911.   ADDITIONAL HEALTHCARE OPTIONS FOR PATIENTS  [facility] / e-Visit: [email or web address]         [facility]: [phone]  [facility]: [phone]                   [facility]: [phone]   

## 2018-12-25 NOTE — Assessment & Plan Note (Signed)
she has mild peripheral neuropathy, likely related to side effects of treatment. It is only mild, not bothering the patient. I will observe for now If it gets worse in the future, I will consider modifying the dose of the treatment  

## 2018-12-25 NOTE — Assessment & Plan Note (Signed)
She has mild pancytopenia but not symptomatic We will proceed with similar dose as before

## 2018-12-25 NOTE — Progress Notes (Signed)
[place] OFFICE PROGRESS NOTE  Patient Care Team: Caryl Bis, MD as PCP - General (Unknown Physician Specialty)  ASSESSMENT & PLAN:  Ovarian cancer [facility]) Her recent CT showed excellent response to therapy I recommend her to complete 6 cycles of chemotherapy before consideration for interval debulking surgery She has mild persistent ascites present with mild symptoms She would like to proceed with another round of paracentesis this week and I will try to arrange that  Pancytopenia, [facility]) She has mild pancytopenia but not symptomatic We will proceed with similar dose as before  Malignant ascites She has probable malignant ascites on exam We will proceed with another therapeutic paracentesis this week  CKD (chronic kidney disease), stage III ([place]) Her renal function is stable The calculated carboplatin dose will be higher but I plan to keep it at about the same dose as before around  AUC of 5.  Peripheral neuropathy due to chemotherapy [facility]) she has mild peripheral neuropathy, likely related to side effects of treatment. It is only mild, not bothering the patient. I will observe for now If it gets worse in the future, I will consider modifying the dose of the treatment    Orders Placed This Encounter  Procedures  . IR Paracentesis    Standing Status:   Future    Standing Expiration Date:   02/24/2020    Order Specific Question:   If therapeutic, is there a maximum amount of fluid to be removed?    Answer:   Yes    Order Specific Question:   What is the maximum amount of fluide to be removed?    Answer:   5 liters    Order Specific Question:   Are labs required for specimen collection?    Answer:   No    Order Specific Question:   Is Albumin medication needed?    Answer:   No    Order Specific Question:   Reason for Exam (SYMPTOM  OR DIAGNOSIS REQUIRED)    Answer:   need therapeutic paracentesis    Order Specific Question:   Preferred Imaging  Location?    Answer:   [facility]    INTERVAL HISTORY: Please see below for problem oriented charting. She returns to be seen for chemotherapy today Since last time I saw her, she felt somewhat uncomfortable with abdominal tightness and reaccumulation of ascites She also noticed some numbness and tingling intermittently in her feet most consistent with peripheral neuropathy Denies nausea No recent changes in bowel habits No recent cough, chest pain or shortness of breath  SUMMARY OF ONCOLOGIC HISTORY: Oncology History   MMR normal NTRK normal     Ovarian cancer ([place])   08/25/2018 Initial Diagnosis    Initially presented with nausea, vomiting and abdominal distension    08/25/2018 Imaging    CT scan demonstrated right pleural thickening and a cardiophrenic lymph node measuring 1.5 cm.  The liver demonstrated mild nodularity and scalloping of the right lobe.  There were 2 less than 1 cm lesions seen within the liver.  There was large volume ascites and a 13 cm upper abdominal left upper quadrant omental cake visualized.  There was extensive peritoneal thickening along the abdomen and pelvic peritoneum.  There is a 1.5 cm aortocaval lymph node identified.  No pelvic adenopathy was identified.  In the pelvis the left adnexa revealed a 4.6 x 3.2 cm ill-defined solid and cystic structure.  No other large dominant pelvic masses identified.  A moderate hiatal hernia was seen and a small umbilical hernia containing fat was seen.    08/29/2018 Tumor Marker    Patient's tumor was tested for the following markers: CA-125. Results of the tumor marker test revealed 1060    09/15/2018 Cancer Staging    Staging form: Ovary, Fallopian Tube, and Primary Peritoneal Carcinoma, AJCC 8th Edition - Clinical: Stage IV (cT3c, cN1, cM1) - Signed by Heath Lark, MD on 09/15/2018    09/15/2018 Tumor Marker    Patient's tumor was tested for the following markers: CA-125. Results of the tumor marker test  revealed 1491    09/25/2018 Pathology Results    Omentum, biopsy - HIGH GRADE CARCINOMA, CONSISTENT WITH PAPILLARY SEROUS CARCINOMA. - SEE COMMENT. Microscopic Comment The malignant cells are positive for cytokeratin 7, estrogen receptor, p53, pax-8, and wt-1. They are negative for cytokeratin 20 and CDX-2. The histology, in conjunction with this immunohistochemical profile, supports the above diagnosis. Dr. Vicente Males has reviewed the case and concurs with this interpretation.     09/25/2018 Procedure    Successful 8 French right internal jugular vein power port placement with its tip at the SVC/RA junction.     09/26/2018 Imaging    1. Examination is positive for nodal metastasis within the anterior and posterior mediastinum as well as right CP angle. 2. There is a small amount of right pleural fluid and pleural soft tissue nodularity which may reflect pleural involvement by tumor. 3. Large volume of ascites with evidence of peritoneal carcinomatosis with omental caking. 4. Aortic Atherosclerosis (ICD10-I70.0) and Emphysema (ICD10-J43.9). 5. Multi vessel coronary artery atherosclerotic calcifications including left main disease     Genetic Testing    Patient has genetic testing done for MMR. Results revealed MMR - normal.    09/29/2018 -  Chemotherapy    The patient had carboplatin and taxol    09/29/2018 -  Chemotherapy    The patient had carboplatin and taxol for chemo    10/20/2018 Tumor Marker    Patient's tumor was tested for the following markers: CA-125. Results of the tumor marker test revealed 2146    10/25/2018 Procedure    Successful ultrasound-guided therapeutic paracentesis yielding 5 liters of peritoneal fluid.     11/14/2018 Procedure    Successful ultrasound-guided paracentesis yielding 2.2 liters of peritoneal fluid.    11/30/2018 Imaging    IMPRESSION: 1. Significant decrease in peritoneal carcinomatosis since previous study. Ascites has mildly increased. 2.  Stable left adnexal mass. 3. Stable mild mediastinal lymphadenopathy. 4. No new or progressive metastatic disease identified. 5. Small to moderate hiatal hernia. Small paraumbilical and right inguinal hernias. 6. Colonic diverticulosis, without radiographic evidence of diverticulitis.    12/01/2018 Tumor Marker    Patient's tumor was tested for the following markers: CA-125. Results of the tumor marker test revealed 527     REVIEW OF SYSTEMS:   Constitutional: Denies fevers, chills or abnormal weight loss Eyes: Denies blurriness of vision Ears, nose, mouth, throat, and face: Denies mucositis or sore throat Respiratory: Denies cough, dyspnea or wheezes Cardiovascular: Denies palpitation, chest discomfort or lower extremity swelling Gastrointestinal:  Denies nausea, heartburn or change in bowel habits Skin: Denies abnormal skin rashes Lymphatics: Denies new lymphadenopathy or easy bruising Behavioral/Psych: Mood is stable, no new changes  All other systems were reviewed with the patient and are negative.  I have reviewed the past medical history, past surgical history, social history and family history with the patient and they are unchanged from previous  note.  ALLERGIES:  has No Known Allergies.  MEDICATIONS:  Current Outpatient Medications  Medication Sig Dispense Refill  . allopurinol (ZYLOPRIM) 300 MG tablet Take 300 mg by mouth daily.    Marland Kitchen aspirin EC 81 MG tablet Take 81 mg by mouth daily.    Marland Kitchen atorvastatin (LIPITOR) 80 MG tablet Take 80 mg by mouth daily.    . benazepril (LOTENSIN) 20 MG tablet Take 20 mg by mouth daily.    . Cholecalciferol (VITAMIN D3 PO) Take 1,000 Units by mouth daily.    . cloNIDine (CATAPRES) 0.2 MG tablet Take 0.2 mg by mouth 2 (two) times daily.    Marland Kitchen dexamethasone (DECADRON) 4 MG tablet Take 2 tabs at the night before and 2 tab the morning of chemotherapy, every 3 weeks, by mouth 24 tablet 0  . diltiazem (CARDIZEM CD) 300 MG 24 hr capsule Take 300 mg  by mouth daily.    Marland Kitchen levothyroxine (SYNTHROID, LEVOTHROID) 100 MCG tablet Take 100 mcg by mouth daily before breakfast.    . lidocaine-prilocaine (EMLA) cream Apply to affected area once 30 g 3  . metFORMIN (GLUCOPHAGE-XR) 500 MG 24 hr tablet Take 2,000 mg by mouth daily with breakfast.    . ondansetron (ZOFRAN) 8 MG tablet Take 1 tablet (8 mg total) by mouth every 8 (eight) hours as needed for refractory nausea / vomiting. Start on day 3 after chemo. 30 tablet 1  . prochlorperazine (COMPAZINE) 10 MG tablet Take 1 tablet (10 mg total) by mouth every 6 (six) hours as needed (Nausea or vomiting). 30 tablet 1   No current facility-administered medications for this visit.     PHYSICAL EXAMINATION: ECOG PERFORMANCE STATUS: 2 - Symptomatic, <50% confined to bed  Vitals:   12/25/18 0812  BP: 123/61  Pulse: 84  Resp: 17  Temp: 97.7 F (36.5 C)  SpO2: 100%   Filed Weights   12/25/18 0812  Weight: 162 lb 6.4 oz (73.7 kg)    GENERAL:alert, no distress and comfortable SKIN: skin color, texture, turgor are normal, no rashes or significant lesions EYES: normal, Conjunctiva are pink and non-injected, sclera clear OROPHARYNX:no exudate, no erythema and lips, buccal mucosa, and tongue normal  NECK: supple, thyroid normal size, non-tender, without nodularity LYMPH:  no palpable lymphadenopathy in the cervical, axillary or inguinal LUNGS: clear to auscultation and percussion with normal breathing effort HEART: regular rate & rhythm and no murmurs and no lower extremity edema ABDOMEN:abdomen soft, distended with ascites Musculoskeletal:no cyanosis of digits and no clubbing  NEURO: alert & oriented x 3 with fluent speech, no focal motor/sensory deficits  LABORATORY DATA:  I have reviewed the data as listed    Component Value Date/Time   NA 140 12/25/2018 0734   K 4.3 12/25/2018 0734   CL 108 12/25/2018 0734   CO2 21 (L) 12/25/2018 0734   GLUCOSE 155 (H) 12/25/2018 0734   BUN 10 12/25/2018  0734   CREATININE 1.06 (H) 12/25/2018 0734   CALCIUM 9.1 12/25/2018 0734   PROT 6.8 12/25/2018 0734   ALBUMIN 3.6 12/25/2018 0734   AST 22 12/25/2018 0734   ALT 24 12/25/2018 0734   ALKPHOS 115 12/25/2018 0734   BILITOT 0.3 12/25/2018 0734   GFRNONAA 54 (L) 12/25/2018 0734   GFRAA >60 12/25/2018 0734    No results found for: SPEP, UPEP  Lab Results  Component Value Date   WBC 3.4 (L) 12/25/2018   NEUTROABS 2.9 12/25/2018   HGB 10.0 (L) 12/25/2018   HCT 30.9 (  L) 12/25/2018   MCV 89.6 12/25/2018   PLT 191 12/25/2018      Chemistry      Component Value Date/Time   NA 140 12/25/2018 0734   K 4.3 12/25/2018 0734   CL 108 12/25/2018 0734   CO2 21 (L) 12/25/2018 0734   BUN 10 12/25/2018 0734   CREATININE 1.06 (H) 12/25/2018 0734      Component Value Date/Time   CALCIUM 9.1 12/25/2018 0734   ALKPHOS 115 12/25/2018 0734   AST 22 12/25/2018 0734   ALT 24 12/25/2018 0734   BILITOT 0.3 12/25/2018 0734       RADIOGRAPHIC STUDIES: I have personally reviewed the radiological images as listed and agreed with the findings in the report. Ct Chest W Contrast  Result Date: 11/30/2018 CLINICAL DATA:  Follow-up metastatic ovarian carcinoma. Ongoing chemotherapy. Personal history of lung carcinoma. EXAM: CT CHEST, ABDOMEN, AND PELVIS WITH CONTRAST TECHNIQUE: Multidetector CT imaging of the chest, abdomen and pelvis was performed following the standard protocol during bolus administration of intravenous contrast. CONTRAST:  117m OMNIPAQUE IOHEXOL 300 MG/ML  SOLN COMPARISON:  Chest CT 09/26/2018 and AP CT on 08/25/2018 FINDINGS: CT CHEST FINDINGS Cardiovascular: No acute findings. Mediastinum/Lymph Nodes: Stable mild mediastinal lymphadenopathy, with largest lymph node in the prevascular space measuring 1.4 cm. Mild lymphadenopathy right cardiophrenic angle is also stable. No new or increased lymphadenopathy identified. Lungs/Pleura: Stable right pleural thickening, without evidence  effusion. No suspicious pulmonary nodules masses identified. No evidence of pulmonary infiltrate. Mild centrilobular emphysema again noted. Musculoskeletal:  No suspicious bone lesions identified. CT ABDOMEN AND PELVIS FINDINGS Hepatobiliary: Tiny sub-cm lesions in right hepatic lobe are stable but remain too small to characterize. These likely represent tiny cysts. No definite liver masses identified. Gallbladder is unremarkable. Pancreas:  No mass or inflammatory changes. Spleen:  Within normal limits in size and appearance. Adrenals/Urinary tract: Stable bilateral renal cysts. No masses or hydronephrosis. Stomach/Bowel: Small to moderate hiatal hernia no evidence of obstruction, inflammatory process, or abnormal fluid collections. Diverticulosis is seen mainly involving the sigmoid colon, however there is no evidence of diverticulitis. Large amount of stool noted in the rectosigmoid colon. Vascular/Lymphatic: No pathologically enlarged lymph nodes identified. No abdominal aortic aneurysm. Aortic atherosclerosis. Reproductive: Soft tissue mass in the left adnexa measures 4.4 x 3.2 cm on image 95/2, without significant change compared to prior study. Moderate ascites shows mild increase since previous study, however there is been significant decrease in omental soft tissue caking and peritoneal thickening in the pelvis, consistent with decreased peritoneal carcinomatosis. Other: Stable small paraumbilical and right inguinal hernias containing ascites. Musculoskeletal:  No suspicious bone lesions identified. IMPRESSION: 1. Significant decrease in peritoneal carcinomatosis since previous study. Ascites has mildly increased. 2. Stable left adnexal mass. 3. Stable mild mediastinal lymphadenopathy. 4. No new or progressive metastatic disease identified. 5. Small to moderate hiatal hernia. Small paraumbilical and right inguinal hernias. 6. Colonic diverticulosis, without radiographic evidence of diverticulitis.  Electronically Signed   By: JEarle GellM.D.   On: 11/30/2018 12:08   Ct Abdomen Pelvis W Contrast  Result Date: 11/30/2018 CLINICAL DATA:  Follow-up metastatic ovarian carcinoma. Ongoing chemotherapy. Personal history of lung carcinoma. EXAM: CT CHEST, ABDOMEN, AND PELVIS WITH CONTRAST TECHNIQUE: Multidetector CT imaging of the chest, abdomen and pelvis was performed following the standard protocol during bolus administration of intravenous contrast. CONTRAST:  1073mOMNIPAQUE IOHEXOL 300 MG/ML  SOLN COMPARISON:  Chest CT 09/26/2018 and AP CT on 08/25/2018 FINDINGS: CT CHEST FINDINGS Cardiovascular: No acute  findings. Mediastinum/Lymph Nodes: Stable mild mediastinal lymphadenopathy, with largest lymph node in the prevascular space measuring 1.4 cm. Mild lymphadenopathy right cardiophrenic angle is also stable. No new or increased lymphadenopathy identified. Lungs/Pleura: Stable right pleural thickening, without evidence effusion. No suspicious pulmonary nodules masses identified. No evidence of pulmonary infiltrate. Mild centrilobular emphysema again noted. Musculoskeletal:  No suspicious bone lesions identified. CT ABDOMEN AND PELVIS FINDINGS Hepatobiliary: Tiny sub-cm lesions in right hepatic lobe are stable but remain too small to characterize. These likely represent tiny cysts. No definite liver masses identified. Gallbladder is unremarkable. Pancreas:  No mass or inflammatory changes. Spleen:  Within normal limits in size and appearance. Adrenals/Urinary tract: Stable bilateral renal cysts. No masses or hydronephrosis. Stomach/Bowel: Small to moderate hiatal hernia no evidence of obstruction, inflammatory process, or abnormal fluid collections. Diverticulosis is seen mainly involving the sigmoid colon, however there is no evidence of diverticulitis. Large amount of stool noted in the rectosigmoid colon. Vascular/Lymphatic: No pathologically enlarged lymph nodes identified. No abdominal aortic aneurysm.  Aortic atherosclerosis. Reproductive: Soft tissue mass in the left adnexa measures 4.4 x 3.2 cm on image 95/2, without significant change compared to prior study. Moderate ascites shows mild increase since previous study, however there is been significant decrease in omental soft tissue caking and peritoneal thickening in the pelvis, consistent with decreased peritoneal carcinomatosis. Other: Stable small paraumbilical and right inguinal hernias containing ascites. Musculoskeletal:  No suspicious bone lesions identified. IMPRESSION: 1. Significant decrease in peritoneal carcinomatosis since previous study. Ascites has mildly increased. 2. Stable left adnexal mass. 3. Stable mild mediastinal lymphadenopathy. 4. No new or progressive metastatic disease identified. 5. Small to moderate hiatal hernia. Small paraumbilical and right inguinal hernias. 6. Colonic diverticulosis, without radiographic evidence of diverticulitis. Electronically Signed   By: Earle Gell M.D.   On: 11/30/2018 12:08    All questions were answered. The patient knows to call the clinic with any problems, questions or concerns. No barriers to learning was detected.  I spent 30 minutes counseling the patient face to face. The total time spent in the appointment was 40 minutes and more than 50% was on counseling and review of test results  Heath Lark, MD 12/25/2018 8:45 AM

## 2018-12-25 NOTE — Assessment & Plan Note (Signed)
Her recent CT showed excellent response to therapy I recommend her to complete 6 cycles of chemotherapy before consideration for interval debulking surgery She has mild persistent ascites present with mild symptoms She would like to proceed with another round of paracentesis this week and I will try to arrange that

## 2018-12-26 LAB — CA 125: Cancer Antigen (CA) 125: 561 U/mL — ABNORMAL HIGH (ref 0.0–38.1)

## 2018-12-27 ENCOUNTER — Encounter (HOSPITAL_COMMUNITY): Payer: Self-pay | Admitting: Student

## 2018-12-27 ENCOUNTER — Ambulatory Visit (HOSPITAL_COMMUNITY)
Admission: RE | Admit: 2018-12-27 | Discharge: 2018-12-27 | Disposition: A | Discharge status: Home / Self Care | Payer: Medicare HMO | Source: Ambulatory Visit | Attending: Hematology and Oncology | Admitting: Hematology and Oncology

## 2018-12-27 ENCOUNTER — Other Ambulatory Visit: Payer: Self-pay

## 2018-12-27 DIAGNOSIS — R180 Malignant ascites: Secondary | ICD-10-CM | POA: Insufficient documentation

## 2018-12-27 DIAGNOSIS — C569 Malignant neoplasm of unspecified ovary: Secondary | ICD-10-CM | POA: Insufficient documentation

## 2018-12-27 DIAGNOSIS — R188 Other ascites: Secondary | ICD-10-CM | POA: Diagnosis not present

## 2018-12-27 HISTORY — PX: IR PARACENTESIS: IMG2679

## 2018-12-27 MED ORDER — LIDOCAINE HCL 1 % IJ SOLN
INTRAMUSCULAR | Status: DC | PRN
  Administered 2018-12-27: 10 mL

## 2018-12-27 NOTE — Procedures (Signed)
PROCEDURE SUMMARY:  Successful image-guided paracentesis from the right lower abdomen.  Yielded 3.3 liters of clear yellow fluid.  No immediate complications.  EBL = 0 mL. Patient tolerated well.   Specimen was not sent for labs.  Claris Pong Joee Iovine PA-C 12/27/2018 10:23 AM

## 2019-01-01 ENCOUNTER — Other Ambulatory Visit: Payer: Self-pay | Admitting: Hematology and Oncology

## 2019-01-01 DIAGNOSIS — R69 Illness, unspecified: Secondary | ICD-10-CM | POA: Diagnosis not present

## 2019-01-04 DIAGNOSIS — I10 Essential (primary) hypertension: Secondary | ICD-10-CM | POA: Diagnosis not present

## 2019-01-04 DIAGNOSIS — E782 Mixed hyperlipidemia: Secondary | ICD-10-CM | POA: Diagnosis not present

## 2019-01-15 ENCOUNTER — Other Ambulatory Visit: Payer: Self-pay

## 2019-01-15 ENCOUNTER — Inpatient Hospital Stay: Payer: Medicare HMO

## 2019-01-15 ENCOUNTER — Inpatient Hospital Stay (HOSPITAL_BASED_OUTPATIENT_CLINIC_OR_DEPARTMENT_OTHER): Payer: Medicare HMO | Admitting: Hematology and Oncology

## 2019-01-15 ENCOUNTER — Encounter: Payer: Self-pay | Admitting: Hematology and Oncology

## 2019-01-15 ENCOUNTER — Telehealth: Payer: Self-pay | Admitting: Oncology

## 2019-01-15 ENCOUNTER — Inpatient Hospital Stay: Payer: Medicare HMO | Attending: Gynecologic Oncology

## 2019-01-15 DIAGNOSIS — C786 Secondary malignant neoplasm of retroperitoneum and peritoneum: Secondary | ICD-10-CM | POA: Insufficient documentation

## 2019-01-15 DIAGNOSIS — I2510 Atherosclerotic heart disease of native coronary artery without angina pectoris: Secondary | ICD-10-CM | POA: Insufficient documentation

## 2019-01-15 DIAGNOSIS — Z7982 Long term (current) use of aspirin: Secondary | ICD-10-CM | POA: Diagnosis not present

## 2019-01-15 DIAGNOSIS — E1122 Type 2 diabetes mellitus with diabetic chronic kidney disease: Secondary | ICD-10-CM | POA: Diagnosis not present

## 2019-01-15 DIAGNOSIS — Z79899 Other long term (current) drug therapy: Secondary | ICD-10-CM | POA: Diagnosis not present

## 2019-01-15 DIAGNOSIS — D61818 Other pancytopenia: Secondary | ICD-10-CM | POA: Insufficient documentation

## 2019-01-15 DIAGNOSIS — Z7984 Long term (current) use of oral hypoglycemic drugs: Secondary | ICD-10-CM

## 2019-01-15 DIAGNOSIS — T451X5A Adverse effect of antineoplastic and immunosuppressive drugs, initial encounter: Secondary | ICD-10-CM

## 2019-01-15 DIAGNOSIS — R590 Localized enlarged lymph nodes: Secondary | ICD-10-CM

## 2019-01-15 DIAGNOSIS — Z5111 Encounter for antineoplastic chemotherapy: Secondary | ICD-10-CM | POA: Diagnosis present

## 2019-01-15 DIAGNOSIS — R180 Malignant ascites: Secondary | ICD-10-CM | POA: Diagnosis not present

## 2019-01-15 DIAGNOSIS — C569 Malignant neoplasm of unspecified ovary: Secondary | ICD-10-CM

## 2019-01-15 DIAGNOSIS — N183 Chronic kidney disease, stage 3 (moderate): Secondary | ICD-10-CM | POA: Insufficient documentation

## 2019-01-15 DIAGNOSIS — T451X5S Adverse effect of antineoplastic and immunosuppressive drugs, sequela: Secondary | ICD-10-CM | POA: Insufficient documentation

## 2019-01-15 DIAGNOSIS — G620 Drug-induced polyneuropathy: Secondary | ICD-10-CM | POA: Diagnosis not present

## 2019-01-15 DIAGNOSIS — E119 Type 2 diabetes mellitus without complications: Secondary | ICD-10-CM | POA: Diagnosis not present

## 2019-01-15 DIAGNOSIS — Z85118 Personal history of other malignant neoplasm of bronchus and lung: Secondary | ICD-10-CM | POA: Insufficient documentation

## 2019-01-15 LAB — CBC WITH DIFFERENTIAL (CANCER CENTER ONLY)
Abs Immature Granulocytes: 0.04 10*3/uL (ref 0.00–0.07)
Basophils Absolute: 0 10*3/uL (ref 0.0–0.1)
Basophils Relative: 0 %
Eosinophils Absolute: 0 10*3/uL (ref 0.0–0.5)
Eosinophils Relative: 0 %
HCT: 31.8 % — ABNORMAL LOW (ref 36.0–46.0)
Hemoglobin: 9.9 g/dL — ABNORMAL LOW (ref 12.0–15.0)
Immature Granulocytes: 1 %
Lymphocytes Relative: 11 %
Lymphs Abs: 0.4 10*3/uL — ABNORMAL LOW (ref 0.7–4.0)
MCH: 28.8 pg (ref 26.0–34.0)
MCHC: 31.1 g/dL (ref 30.0–36.0)
MCV: 92.4 fL (ref 80.0–100.0)
Monocytes Absolute: 0 10*3/uL — ABNORMAL LOW (ref 0.1–1.0)
Monocytes Relative: 1 %
Neutro Abs: 3.5 10*3/uL (ref 1.7–7.7)
Neutrophils Relative %: 87 %
Platelet Count: 170 10*3/uL (ref 150–400)
RBC: 3.44 MIL/uL — ABNORMAL LOW (ref 3.87–5.11)
RDW: 16.2 % — ABNORMAL HIGH (ref 11.5–15.5)
WBC Count: 4 10*3/uL (ref 4.0–10.5)
nRBC: 0 % (ref 0.0–0.2)

## 2019-01-15 LAB — CMP (CANCER CENTER ONLY)
ALT: 25 U/L (ref 0–44)
AST: 22 U/L (ref 15–41)
Albumin: 3.4 g/dL — ABNORMAL LOW (ref 3.5–5.0)
Alkaline Phosphatase: 118 U/L (ref 38–126)
Anion gap: 13 (ref 5–15)
BUN: 13 mg/dL (ref 8–23)
CO2: 19 mmol/L — ABNORMAL LOW (ref 22–32)
Calcium: 8.7 mg/dL — ABNORMAL LOW (ref 8.9–10.3)
Chloride: 107 mmol/L (ref 98–111)
Creatinine: 1.2 mg/dL — ABNORMAL HIGH (ref 0.44–1.00)
GFR, Est AFR Am: 54 mL/min — ABNORMAL LOW (ref >60)
GFR, Estimated: 46 mL/min — ABNORMAL LOW (ref >60)
Glucose, Bld: 348 mg/dL — ABNORMAL HIGH (ref 70–99)
Potassium: 4.2 mmol/L (ref 3.5–5.1)
Sodium: 139 mmol/L (ref 135–145)
Total Bilirubin: 0.2 mg/dL — ABNORMAL LOW (ref 0.3–1.2)
Total Protein: 6.5 g/dL (ref 6.5–8.1)

## 2019-01-15 MED ORDER — DIPHENHYDRAMINE HCL 50 MG/ML IJ SOLN
INTRAMUSCULAR | Status: AC
  Filled 2019-01-15: qty 1

## 2019-01-15 MED ORDER — SODIUM CHLORIDE 0.9 % IV SOLN
140.0000 mg/m2 | Freq: Once | INTRAVENOUS | Status: AC
  Administered 2019-01-15: 11:00:00 270 mg via INTRAVENOUS
  Filled 2019-01-15: qty 45

## 2019-01-15 MED ORDER — SODIUM CHLORIDE 0.9 % IV SOLN
400.0000 mg | Freq: Once | INTRAVENOUS | Status: AC
  Administered 2019-01-15: 14:00:00 400 mg via INTRAVENOUS
  Filled 2019-01-15: qty 40

## 2019-01-15 MED ORDER — SODIUM CHLORIDE 0.9% FLUSH
10.0000 mL | INTRAVENOUS | Status: DC | PRN
  Administered 2019-01-15: 10 mL
  Filled 2019-01-15: qty 10

## 2019-01-15 MED ORDER — SODIUM CHLORIDE 0.9 % IV SOLN
Freq: Once | INTRAVENOUS | Status: AC
  Administered 2019-01-15: 10:00:00 via INTRAVENOUS
  Filled 2019-01-15: qty 250

## 2019-01-15 MED ORDER — FAMOTIDINE IN NACL 20-0.9 MG/50ML-% IV SOLN
20.0000 mg | Freq: Once | INTRAVENOUS | Status: AC
  Administered 2019-01-15: 20 mg via INTRAVENOUS

## 2019-01-15 MED ORDER — PALONOSETRON HCL INJECTION 0.25 MG/5ML
INTRAVENOUS | Status: AC
  Filled 2019-01-15: qty 5

## 2019-01-15 MED ORDER — FAMOTIDINE IN NACL 20-0.9 MG/50ML-% IV SOLN
INTRAVENOUS | Status: AC
  Filled 2019-01-15: qty 50

## 2019-01-15 MED ORDER — DIPHENHYDRAMINE HCL 50 MG/ML IJ SOLN
50.0000 mg | Freq: Once | INTRAMUSCULAR | Status: AC
  Administered 2019-01-15: 50 mg via INTRAVENOUS

## 2019-01-15 MED ORDER — PALONOSETRON HCL INJECTION 0.25 MG/5ML
0.2500 mg | Freq: Once | INTRAVENOUS | Status: AC
  Administered 2019-01-15: 0.25 mg via INTRAVENOUS

## 2019-01-15 MED ORDER — HEPARIN SOD (PORK) LOCK FLUSH 100 UNIT/ML IV SOLN
500.0000 [IU] | Freq: Once | INTRAVENOUS | Status: AC | PRN
  Administered 2019-01-15: 500 [IU]
  Filled 2019-01-15: qty 5

## 2019-01-15 MED ORDER — SODIUM CHLORIDE 0.9% FLUSH
10.0000 mL | Freq: Once | INTRAVENOUS | Status: AC
  Administered 2019-01-15: 10 mL
  Filled 2019-01-15: qty 10

## 2019-01-15 MED ORDER — SODIUM CHLORIDE 0.9 % IV SOLN
Freq: Once | INTRAVENOUS | Status: AC
  Administered 2019-01-15: 10:00:00 via INTRAVENOUS
  Filled 2019-01-15: qty 5

## 2019-01-15 NOTE — Progress Notes (Signed)
Released carboplatin. Physician and pharmacy to review dose.

## 2019-01-15 NOTE — Patient Instructions (Signed)
[facility] Discharge Instructions for Patients Receiving Chemotherapy  Today you received the following chemotherapy agents Paclitaxel (TAXOL) & Carboplatin (PARAPLATIN).  To help prevent nausea and vomiting after your treatment, we encourage you to take your nausea medication as prescribed.   If you develop nausea and vomiting that is not controlled by your nausea medication, call the clinic.   BELOW ARE SYMPTOMS THAT SHOULD BE REPORTED IMMEDIATELY:  *FEVER GREATER THAN 100.5 F  *CHILLS WITH OR WITHOUT FEVER  NAUSEA AND VOMITING THAT IS NOT CONTROLLED WITH YOUR NAUSEA MEDICATION  *UNUSUAL SHORTNESS OF BREATH  *UNUSUAL BRUISING OR BLEEDING  TENDERNESS IN MOUTH AND THROAT WITH OR WITHOUT PRESENCE OF ULCERS  *URINARY PROBLEMS  *BOWEL PROBLEMS  UNUSUAL RASH Items with * indicate a potential emergency and should be followed up as soon as possible.  Feel free to call the clinic should you have any questions or concerns. The clinic phone number is [phone].  Please show the CHEMO ALERT CARD at check-in to the Emergency Department and triage nurse.  Coronavirus (COVID-19) Are you at risk?  Are you at risk for the Coronavirus (COVID-19)?  To be considered HIGH RISK for Coronavirus (COVID-19), you have to meet the following criteria:  . Traveled to [place], [place], [place], [place] or [place]; or in the [place] to [place], [place], [place], or [place]; and have fever, cough, and shortness of breath within the last 2 weeks of travel OR . Been in close contact with a person diagnosed with COVID-19 within the last 2 weeks and have fever, cough, and shortness of breath . IF YOU DO NOT MEET THESE CRITERIA, YOU ARE CONSIDERED LOW RISK FOR COVID-19.  What to do if you are HIGH RISK for COVID-19?  . If you are having a medical emergency, call 911. . Seek medical care right away. Before you go to a doctor's office, urgent care or emergency department,  call ahead and tell them about your recent travel, contact with someone diagnosed with COVID-19, and your symptoms. You should receive instructions from your physician's office regarding next steps of care.  . When you arrive at healthcare provider, tell the healthcare staff immediately you have returned from visiting [place], [place], [place], [place] or [place]; or traveled in the [place] to [place], [place], [place], or [place]; in the last two weeks or you have been in close contact with a person diagnosed with COVID-19 in the last 2 weeks.   . Tell the health care staff about your symptoms: fever, cough and shortness of breath. . After you have been seen by a medical provider, you will be either: o Tested for (COVID-19) and discharged home on quarantine except to seek medical care if symptoms worsen, and asked to  - Stay home and avoid contact with others until you get your results (4-5 days)  - Avoid travel on public transportation if possible (such as bus, train, or airplane) or o Sent to the Emergency Department by EMS for evaluation, COVID-19 testing, and possible admission depending on your condition and test results.  What to do if you are LOW RISK for COVID-19?  Reduce your risk of any infection by using the same precautions used for avoiding the common cold or flu:  . Wash your hands often with soap and warm water for at least 20 seconds.  If soap and water are not readily available, use an alcohol-based hand sanitizer with at least 60% alcohol.  .   If coughing or sneezing, cover your mouth and nose by coughing or sneezing into the elbow areas of your shirt or coat, into a tissue or into your sleeve (not your hands). . Avoid shaking hands with others and consider head nods or verbal greetings only. . Avoid touching your eyes, nose, or mouth with unwashed hands.  . Avoid close contact with people who are sick. . Avoid places or events with large numbers of people in one  location, like concerts or sporting events. . Carefully consider travel plans you have or are making. . If you are planning any travel outside or inside the [place], visit the CDC's Travelers' Health webpage for the latest health notices. . If you have some symptoms but not all symptoms, continue to monitor at home and seek medical attention if your symptoms worsen. . If you are having a medical emergency, call 911.   ADDITIONAL HEALTHCARE OPTIONS FOR PATIENTS  [facility] / e-Visit: [email or web address]         [facility]: [phone]  [facility]: [phone]                   [facility]: [phone]   

## 2019-01-15 NOTE — Assessment & Plan Note (Signed)
she has mild peripheral neuropathy, likely related to side effects of treatment. It is only mild, not bothering the patient. I will observe for now If it gets worse in the future, I will consider modifying the dose of the treatment  

## 2019-01-15 NOTE — Progress Notes (Signed)
[place] OFFICE PROGRESS NOTE  Patient Care Team: Caryl Bis, MD as PCP - General (Unknown Physician Specialty)  ASSESSMENT & PLAN:  Ovarian cancer [facility]) Her recent CT showed excellent response to therapy She tolerated chemotherapy well without major side effects We will proceed with cycle 6 She has mild persistent ascites present with mild symptoms I plan to repeat paracentesis in 2 weeks with repeat CT imaging, hopefully to be done in the same day and then I will review the plan of care with the patient  History of lung cancer She has stable mediastinal lymphadenopathy Observe only I plan to repeat CT imaging at the end of the month  Malignant ascites She has persistent ascites on exam I plan to order a paracentesis in 2 weeks before CT imaging  Pancytopenia, acquired ([place]) She has mild intermittent pancytopenia due to side effects of treatment but not symptomatic Observe only Continue treatment without delay  Peripheral neuropathy due to chemotherapy [facility]) she has mild peripheral neuropathy, likely related to side effects of treatment. It is only mild, not bothering the patient. I will observe for now If it gets worse in the future, I will consider modifying the dose of the treatment    Orders Placed This Encounter  Procedures  . IR Paracentesis    Standing Status:   Future    Standing Expiration Date:   03/16/2020    Order Specific Question:   If therapeutic, is there a maximum amount of fluid to be removed?    Answer:   Yes    Order Specific Question:   What is the maximum amount of fluide to be removed?    Answer:   5 liters    Order Specific Question:   Are labs required for specimen collection?    Answer:   No    Order Specific Question:   Is Albumin medication needed?    Answer:   No    Order Specific Question:   Reason for Exam (SYMPTOM  OR DIAGNOSIS REQUIRED)    Answer:   malignant ascites    Order Specific Question:   Preferred  Imaging Location?    Answer:   [facility]  . CT CHEST W CONTRAST    Standing Status:   Future    Standing Expiration Date:   01/15/2020    Order Specific Question:   If indicated for the ordered procedure, I authorize the administration of contrast media per Radiology protocol    Answer:   Yes    Order Specific Question:   Preferred imaging location?    Answer:   [facility]    Order Specific Question:   Radiology Contrast Protocol - do NOT remove file path    Answer:   \\[email or web address]  . CT ABDOMEN PELVIS W CONTRAST    Standing Status:   Future    Standing Expiration Date:   01/15/2020    Order Specific Question:   If indicated for the ordered procedure, I authorize the administration of contrast media per Radiology protocol    Answer:   Yes    Order Specific Question:   Preferred imaging location?    Answer:   [facility]    Order Specific Question:   Radiology Contrast Protocol - do NOT remove file path    Answer:   \\[email or web address]    INTERVAL HISTORY: Please see below for problem oriented charting. She is seen prior to cycle 6 of chemotherapy She denies worsening neuropathy  No recent cough, chest pain or shortness of breath The abdominal ascites does not bother her She denies nausea or recent changes in bowel habits  SUMMARY OF ONCOLOGIC HISTORY: Oncology History   MMR normal NTRK normal     Ovarian cancer ([place])   08/25/2018 Initial Diagnosis    Initially presented with nausea, vomiting and abdominal distension    08/25/2018 Imaging    CT scan demonstrated right pleural thickening and a cardiophrenic lymph node measuring 1.5 cm.  The liver demonstrated mild nodularity and scalloping of the right lobe.  There were 2 less than 1 cm lesions seen within the liver.  There was large volume ascites and a 13 cm upper abdominal left upper quadrant omental cake visualized.  There was extensive  peritoneal thickening along the abdomen and pelvic peritoneum.  There is a 1.5 cm aortocaval lymph node identified.  No pelvic adenopathy was identified.  In the pelvis the left adnexa revealed a 4.6 x 3.2 cm ill-defined solid and cystic structure.  No other large dominant pelvic masses identified.  A moderate hiatal hernia was seen and a small umbilical hernia containing fat was seen.    08/29/2018 Tumor Marker    Patient's tumor was tested for the following markers: CA-125. Results of the tumor marker test revealed 1060    09/15/2018 Cancer Staging    Staging form: Ovary, Fallopian Tube, and Primary Peritoneal Carcinoma, AJCC 8th Edition - Clinical: Stage IV (cT3c, cN1, cM1) - Signed by Heath Lark, MD on 09/15/2018    09/15/2018 Tumor Marker    Patient's tumor was tested for the following markers: CA-125. Results of the tumor marker test revealed 1491    09/25/2018 Pathology Results    Omentum, biopsy - HIGH GRADE CARCINOMA, CONSISTENT WITH PAPILLARY SEROUS CARCINOMA. - SEE COMMENT. Microscopic Comment The malignant cells are positive for cytokeratin 7, estrogen receptor, p53, pax-8, and wt-1. They are negative for cytokeratin 20 and CDX-2. The histology, in conjunction with this immunohistochemical profile, supports the above diagnosis. Dr. Vicente Males has reviewed the case and concurs with this interpretation.     09/25/2018 Procedure    Successful 8 French right internal jugular vein power port placement with its tip at the SVC/RA junction.     09/26/2018 Imaging    1. Examination is positive for nodal metastasis within the anterior and posterior mediastinum as well as right CP angle. 2. There is a small amount of right pleural fluid and pleural soft tissue nodularity which may reflect pleural involvement by tumor. 3. Large volume of ascites with evidence of peritoneal carcinomatosis with omental caking. 4. Aortic Atherosclerosis (ICD10-I70.0) and Emphysema (ICD10-J43.9). 5. Multi  vessel coronary artery atherosclerotic calcifications including left main disease     Genetic Testing    Patient has genetic testing done for MMR. Results revealed MMR - normal.    09/29/2018 -  Chemotherapy    The patient had carboplatin and taxol    09/29/2018 -  Chemotherapy    The patient had carboplatin and taxol for chemo    10/20/2018 Tumor Marker    Patient's tumor was tested for the following markers: CA-125. Results of the tumor marker test revealed 2146    10/25/2018 Procedure    Successful ultrasound-guided therapeutic paracentesis yielding 5 liters of peritoneal fluid.     11/14/2018 Procedure    Successful ultrasound-guided paracentesis yielding 2.2 liters of peritoneal fluid.    11/30/2018 Imaging    IMPRESSION: 1. Significant decrease in peritoneal carcinomatosis since previous study. Ascites  has mildly increased. 2. Stable left adnexal mass. 3. Stable mild mediastinal lymphadenopathy. 4. No new or progressive metastatic disease identified. 5. Small to moderate hiatal hernia. Small paraumbilical and right inguinal hernias. 6. Colonic diverticulosis, without radiographic evidence of diverticulitis.    12/01/2018 Tumor Marker    Patient's tumor was tested for the following markers: CA-125. Results of the tumor marker test revealed 527    12/25/2018 Tumor Marker    Patient's tumor was tested for the following markers: CA-125. Results of the tumor marker test revealed 561    12/27/2018 Procedure    Successful ultrasound-guided paracentesis yielding 3.3 L of peritoneal fluid.     REVIEW OF SYSTEMS:   Constitutional: Denies fevers, chills or abnormal weight loss Eyes: Denies blurriness of vision Ears, nose, mouth, throat, and face: Denies mucositis or sore throat Respiratory: Denies cough, dyspnea or wheezes Cardiovascular: Denies palpitation, chest discomfort or lower extremity swelling Gastrointestinal:  Denies nausea, heartburn or change in bowel habits Skin:  Denies abnormal skin rashes Lymphatics: Denies new lymphadenopathy or easy bruising Neurological:Denies numbness, tingling or new weaknesses Behavioral/Psych: Mood is stable, no new changes  All other systems were reviewed with the patient and are negative.  I have reviewed the past medical history, past surgical history, social history and family history with the patient and they are unchanged from previous note.  ALLERGIES:  has No Known Allergies.  MEDICATIONS:  Current Outpatient Medications  Medication Sig Dispense Refill  . allopurinol (ZYLOPRIM) 300 MG tablet Take 300 mg by mouth daily.    Marland Kitchen aspirin EC 81 MG tablet Take 81 mg by mouth daily.    Marland Kitchen atorvastatin (LIPITOR) 80 MG tablet Take 80 mg by mouth daily.    . benazepril (LOTENSIN) 20 MG tablet Take 20 mg by mouth daily.    . Cholecalciferol (VITAMIN D3 PO) Take 1,000 Units by mouth daily.    . cloNIDine (CATAPRES) 0.2 MG tablet Take 0.2 mg by mouth 2 (two) times daily.    Marland Kitchen dexamethasone (DECADRON) 4 MG tablet TAKE TWO (2) TABLETS BY MOUTH THE NIGHT BEFORE CHEMOTHERAPY AND TWO TABLETS THE MORNING OF CHEMOTHERAPY EVERY 3 WEEKS. 24 tablet 0  . diltiazem (CARDIZEM CD) 300 MG 24 hr capsule Take 300 mg by mouth daily.    Marland Kitchen levothyroxine (SYNTHROID, LEVOTHROID) 100 MCG tablet Take 100 mcg by mouth daily before breakfast.    . lidocaine-prilocaine (EMLA) cream Apply to affected area once 30 g 3  . metFORMIN (GLUCOPHAGE-XR) 500 MG 24 hr tablet Take 2,000 mg by mouth daily with breakfast.    . ondansetron (ZOFRAN) 8 MG tablet Take 1 tablet (8 mg total) by mouth every 8 (eight) hours as needed for refractory nausea / vomiting. Start on day 3 after chemo. 30 tablet 1  . prochlorperazine (COMPAZINE) 10 MG tablet Take 1 tablet (10 mg total) by mouth every 6 (six) hours as needed (Nausea or vomiting). 30 tablet 1   No current facility-administered medications for this visit.     PHYSICAL EXAMINATION: ECOG PERFORMANCE STATUS: 1 -  Symptomatic but completely ambulatory  Vitals:   01/15/19 0917  BP: (!) 147/65  Pulse: 95  Resp: 18  Temp: 98.2 F (36.8 C)  SpO2: 100%   Filed Weights   01/15/19 0917  Weight: 164 lb 12.8 oz (74.8 kg)    GENERAL:alert, no distress and comfortable SKIN: skin color, texture, turgor are normal, no rashes or significant lesions EYES: normal, Conjunctiva are pink and non-injected, sclera clear OROPHARYNX:no exudate, no  erythema and lips, buccal mucosa, and tongue normal  NECK: supple, thyroid normal size, non-tender, without nodularity LYMPH:  no palpable lymphadenopathy in the cervical, axillary or inguinal LUNGS: clear to auscultation and percussion with normal breathing effort HEART: regular rate & rhythm and no murmurs and no lower extremity edema ABDOMEN:abdomen soft, non-tender and normal bowel sounds.  Her abdomen is distended with persistent ascites Musculoskeletal:no cyanosis of digits and no clubbing  NEURO: alert & oriented x 3 with fluent speech, no focal motor/sensory deficits  LABORATORY DATA:  I have reviewed the data as listed    Component Value Date/Time   NA 140 12/25/2018 0734   K 4.3 12/25/2018 0734   CL 108 12/25/2018 0734   CO2 21 (L) 12/25/2018 0734   GLUCOSE 155 (H) 12/25/2018 0734   BUN 10 12/25/2018 0734   CREATININE 1.06 (H) 12/25/2018 0734   CALCIUM 9.1 12/25/2018 0734   PROT 6.8 12/25/2018 0734   ALBUMIN 3.6 12/25/2018 0734   AST 22 12/25/2018 0734   ALT 24 12/25/2018 0734   ALKPHOS 115 12/25/2018 0734   BILITOT 0.3 12/25/2018 0734   GFRNONAA 54 (L) 12/25/2018 0734   GFRAA >60 12/25/2018 0734    No results found for: SPEP, UPEP  Lab Results  Component Value Date   WBC 4.0 01/15/2019   NEUTROABS 3.5 01/15/2019   HGB 9.9 (L) 01/15/2019   HCT 31.8 (L) 01/15/2019   MCV 92.4 01/15/2019   PLT 170 01/15/2019      Chemistry      Component Value Date/Time   NA 140 12/25/2018 0734   K 4.3 12/25/2018 0734   CL 108 12/25/2018 0734   CO2  21 (L) 12/25/2018 0734   BUN 10 12/25/2018 0734   CREATININE 1.06 (H) 12/25/2018 0734      Component Value Date/Time   CALCIUM 9.1 12/25/2018 0734   ALKPHOS 115 12/25/2018 0734   AST 22 12/25/2018 0734   ALT 24 12/25/2018 0734   BILITOT 0.3 12/25/2018 0734       RADIOGRAPHIC STUDIES: I have personally reviewed the radiological images as listed and agreed with the findings in the report. Ir Paracentesis  Result Date: 12/27/2018 INDICATION: Patient with history of ovarian cancer and recurrent malignant ascites. Request is made for therapeutic paracentesis. EXAM: ULTRASOUND GUIDED THERAPEUTIC PARACENTESIS MEDICATIONS: 10 mL 1% lidocaine COMPLICATIONS: None immediate. PROCEDURE: Informed written consent was obtained from the patient after a discussion of the risks, benefits and alternatives to treatment. A timeout was performed prior to the initiation of the procedure. Initial ultrasound scanning demonstrates a moderate amount of ascites within the right lower abdominal quadrant. The right lower abdomen was prepped and draped in the usual sterile fashion. 1% lidocaine was used for local anesthesia. Following this, a 19 gauge, 7-cm, Yueh catheter was introduced. An ultrasound image was saved for documentation purposes. The paracentesis was performed. The catheter was removed and a dressing was applied. The patient tolerated the procedure well without immediate post procedural complication. FINDINGS: A total of approximately 3.3 L of clear yellow fluid was removed. IMPRESSION: Successful ultrasound-guided paracentesis yielding 3.3 L of peritoneal fluid. Read by: Earley Abide, PA-C Electronically Signed   By: Aletta Edouard M.D.   On: 12/27/2018 12:16    All questions were answered. The patient knows to call the clinic with any problems, questions or concerns. No barriers to learning was detected.  I spent 30 minutes counseling the patient face to face. The total time spent in the appointment was  40 minutes and more than 50% was on counseling and review of test results  Heath Lark, MD 01/15/2019 9:18 AM

## 2019-01-15 NOTE — Progress Notes (Signed)
01/15/19  Carboplatin dose reviewed:  Creatinine = 1.2 CrCl = 53 mL/min AUC = 5 New dose = 390 mg - rounded to 400 mg  Henreitta Leber, PharmD

## 2019-01-15 NOTE — Telephone Encounter (Signed)
Left a message for patient with appointments for paracentesis at [facility] at 2 pm and CT scan at 3:30 pm (NPO 4 hours before, drink contrast at 1:30 and 2:30).

## 2019-01-15 NOTE — Assessment & Plan Note (Addendum)
She has mild intermittent pancytopenia due to side effects of treatment but not symptomatic Observe only Continue treatment without delay

## 2019-01-15 NOTE — Assessment & Plan Note (Signed)
She has persistent ascites on exam I plan to order a paracentesis in 2 weeks before CT imaging

## 2019-01-15 NOTE — Assessment & Plan Note (Signed)
Her recent CT showed excellent response to therapy She tolerated chemotherapy well without major side effects We will proceed with cycle 6 She has mild persistent ascites present with mild symptoms I plan to repeat paracentesis in 2 weeks with repeat CT imaging, hopefully to be done in the same day and then I will review the plan of care with the patient

## 2019-01-15 NOTE — Assessment & Plan Note (Signed)
She has stable mediastinal lymphadenopathy Observe only I plan to repeat CT imaging at the end of the month

## 2019-01-16 LAB — CA 125: Cancer Antigen (CA) 125: 555 U/mL — ABNORMAL HIGH (ref 0.0–38.1)

## 2019-01-24 ENCOUNTER — Telehealth: Payer: Self-pay | Admitting: Hematology and Oncology

## 2019-01-24 NOTE — Telephone Encounter (Signed)
Spoke with patient re 5/28 f/u

## 2019-01-25 DIAGNOSIS — Z1231 Encounter for screening mammogram for malignant neoplasm of breast: Secondary | ICD-10-CM | POA: Diagnosis not present

## 2019-01-31 ENCOUNTER — Encounter (HOSPITAL_COMMUNITY): Payer: Self-pay | Admitting: Student

## 2019-01-31 ENCOUNTER — Ambulatory Visit (HOSPITAL_COMMUNITY)
Admission: RE | Admit: 2019-01-31 | Discharge: 2019-01-31 | Disposition: A | Discharge status: Home / Self Care | Payer: Medicare HMO | Source: Ambulatory Visit | Attending: Hematology and Oncology | Admitting: Hematology and Oncology

## 2019-01-31 ENCOUNTER — Other Ambulatory Visit: Payer: Self-pay

## 2019-01-31 DIAGNOSIS — J90 Pleural effusion, not elsewhere classified: Secondary | ICD-10-CM | POA: Diagnosis not present

## 2019-01-31 DIAGNOSIS — Z85118 Personal history of other malignant neoplasm of bronchus and lung: Secondary | ICD-10-CM

## 2019-01-31 DIAGNOSIS — R180 Malignant ascites: Secondary | ICD-10-CM | POA: Insufficient documentation

## 2019-01-31 DIAGNOSIS — C569 Malignant neoplasm of unspecified ovary: Secondary | ICD-10-CM

## 2019-01-31 DIAGNOSIS — C801 Malignant (primary) neoplasm, unspecified: Secondary | ICD-10-CM | POA: Diagnosis not present

## 2019-01-31 DIAGNOSIS — C562 Malignant neoplasm of left ovary: Secondary | ICD-10-CM | POA: Diagnosis not present

## 2019-01-31 HISTORY — PX: IR PARACENTESIS: IMG2679

## 2019-01-31 MED ORDER — LIDOCAINE HCL (PF) 1 % IJ SOLN
INTRAMUSCULAR | Status: DC | PRN
  Administered 2019-01-31: 10 mL

## 2019-01-31 MED ORDER — LIDOCAINE HCL 1 % IJ SOLN
INTRAMUSCULAR | Status: AC
  Filled 2019-01-31: qty 20

## 2019-01-31 MED ORDER — IOHEXOL 300 MG/ML  SOLN
100.0000 mL | Freq: Once | INTRAMUSCULAR | Status: AC | PRN
  Administered 2019-01-31: 100 mL via INTRAVENOUS

## 2019-01-31 NOTE — Procedures (Signed)
PROCEDURE SUMMARY:  Successful image-guided paracentesis from the left lower abdomen.  Yielded 3.6 liters of clear yellow fluid.  No immediate complications.  EBL = 5 mL. Patient tolerated well.   Specimen was not sent for labs.  Claris Pong Talon Witting PA-C 01/31/2019 2:59 PM

## 2019-02-01 ENCOUNTER — Inpatient Hospital Stay (HOSPITAL_BASED_OUTPATIENT_CLINIC_OR_DEPARTMENT_OTHER): Payer: Medicare HMO | Admitting: Hematology and Oncology

## 2019-02-01 ENCOUNTER — Encounter: Payer: Self-pay | Admitting: Hematology and Oncology

## 2019-02-01 ENCOUNTER — Other Ambulatory Visit: Payer: Self-pay

## 2019-02-01 VITALS — BP 113/56 | HR 84 | Temp 98.3°F | Resp 18 | Ht 65.0 in | Wt 151.0 lb

## 2019-02-01 DIAGNOSIS — I2510 Atherosclerotic heart disease of native coronary artery without angina pectoris: Secondary | ICD-10-CM

## 2019-02-01 DIAGNOSIS — R590 Localized enlarged lymph nodes: Secondary | ICD-10-CM

## 2019-02-01 DIAGNOSIS — C786 Secondary malignant neoplasm of retroperitoneum and peritoneum: Secondary | ICD-10-CM

## 2019-02-01 DIAGNOSIS — E1122 Type 2 diabetes mellitus with diabetic chronic kidney disease: Secondary | ICD-10-CM

## 2019-02-01 DIAGNOSIS — Z7189 Other specified counseling: Secondary | ICD-10-CM

## 2019-02-01 DIAGNOSIS — D61818 Other pancytopenia: Secondary | ICD-10-CM

## 2019-02-01 DIAGNOSIS — Z7982 Long term (current) use of aspirin: Secondary | ICD-10-CM | POA: Diagnosis not present

## 2019-02-01 DIAGNOSIS — N183 Chronic kidney disease, stage 3 unspecified: Secondary | ICD-10-CM

## 2019-02-01 DIAGNOSIS — Z5111 Encounter for antineoplastic chemotherapy: Secondary | ICD-10-CM | POA: Diagnosis not present

## 2019-02-01 DIAGNOSIS — Z7984 Long term (current) use of oral hypoglycemic drugs: Secondary | ICD-10-CM | POA: Diagnosis not present

## 2019-02-01 DIAGNOSIS — R180 Malignant ascites: Secondary | ICD-10-CM

## 2019-02-01 DIAGNOSIS — Z85118 Personal history of other malignant neoplasm of bronchus and lung: Secondary | ICD-10-CM

## 2019-02-01 DIAGNOSIS — C569 Malignant neoplasm of unspecified ovary: Secondary | ICD-10-CM

## 2019-02-01 DIAGNOSIS — Z79899 Other long term (current) drug therapy: Secondary | ICD-10-CM

## 2019-02-01 DIAGNOSIS — G620 Drug-induced polyneuropathy: Secondary | ICD-10-CM | POA: Diagnosis not present

## 2019-02-01 DIAGNOSIS — T451X5S Adverse effect of antineoplastic and immunosuppressive drugs, sequela: Secondary | ICD-10-CM | POA: Diagnosis not present

## 2019-02-01 NOTE — Progress Notes (Signed)
DISCONTINUE ON PATHWAY REGIMEN - Ovarian     A cycle is every 21 days:     Paclitaxel      Carboplatin   **Always confirm dose/schedule in your pharmacy ordering system**  REASON: Other Reason PRIOR TREATMENT: [id number]: Carboplatin AUC=6 + Paclitaxel 175 mg/m2 q21 Days x 2-4 Cycles TREATMENT RESPONSE: Partial Response (PR)  START OFF PATHWAY REGIMEN - Ovarian   [id number]:Carboplatin AUC=4 D1 + Gemcitabine 800 mg/m2 D1, 8 q21 Days:   A cycle is every 21 days:     Gemcitabine      Carboplatin   **Always confirm dose/schedule in your pharmacy ordering system**  Patient Characteristics: Preoperative or Nonsurgical Candidate (Clinical Staging), Newly Diagnosed, Neoadjuvant Therapy followed by Surgery Therapeutic Status: Preoperative or Nonsurgical Candidate (Clinical Staging) BRCA Mutation Status: Absent AJCC T Category: cT3c AJCC 8 Stage Grouping: IVA AJCC N Category: cN1 AJCC M Category: VX4I Therapy Plan: Neoadjuvant Therapy followed by Surgery Intent of Therapy: Curative Intent, Discussed with Patient

## 2019-02-01 NOTE — Assessment & Plan Note (Signed)
She has persistent, malignant ascites despite treatment She has therapeutic paracentesis recently with symptomatic relief

## 2019-02-01 NOTE — Assessment & Plan Note (Signed)
I have reviewed imaging study with the patient The patient have partial response to treatment only She is not a candidate for interval debulking surgery due to persistent mediastinal lymphadenopathy I reviewed the guidelines with the patient I recommend changing her treatment to either carbo platinum, gemcitabine and bevacizumab versus carboplatin, doxorubicin and bevacizumab We discussed some of the risk, benefits, side effects of treatment Ultimately, she would like to be treated with carboplatin, gemcitabine and bevacizumab I recommend starting the first few cycles without bevacizumab I recommend 3 cycles of treatment followed by repeat imaging study If she have good response to treatment, I will introduce bevacizumab in the future. She will continue tumor marker monitoring We discussed the risks, benefits, side effects of treatment including pancytopenia, risk of infection, risk for transfusion support, allergic reaction and nausea and she is willing to proceed I will get her started on cycle 1 next week and I will see her prior to cycle 1 day 8 of treatment

## 2019-02-01 NOTE — Assessment & Plan Note (Signed)
Renal function is stable We will adjust the dose of her chemotherapy accordingly

## 2019-02-01 NOTE — Progress Notes (Signed)
[facility] OFFICE PROGRESS NOTE  Patient Care Team: Christy Bis, MD as PCP - General (Unknown Physician Specialty)  ASSESSMENT & PLAN:  Ovarian cancer [facility]) I have reviewed imaging study with the patient The patient have partial response to treatment only She is not a candidate for interval debulking surgery due to persistent mediastinal lymphadenopathy I reviewed the guidelines with the patient I recommend changing her treatment to either carbo platinum, gemcitabine and bevacizumab versus carboplatin, doxorubicin and bevacizumab We discussed some of the risk, benefits, side effects of treatment Ultimately, she would like to be treated with carboplatin, gemcitabine and bevacizumab I recommend starting the first few cycles without bevacizumab I recommend 3 cycles of treatment followed by repeat imaging study If she have good response to treatment, I will introduce bevacizumab in the future. She will continue tumor marker monitoring We discussed the risks, benefits, side effects of treatment including pancytopenia, risk of infection, risk for transfusion support, allergic reaction and nausea and she is willing to proceed I will get her started on cycle 1 next week and I will see her prior to cycle 1 day 8 of treatment  Pancytopenia, acquired ([place]) She has progressive pancytopenia due to treatment but not symptomatic She is aware she might need transfusion support in the future We will monitor her blood counts carefully while on treatment  Malignant ascites She has persistent, malignant ascites despite treatment She has therapeutic paracentesis recently with symptomatic relief  CKD (chronic kidney disease), stage III ([facility]) Renal function is stable We will adjust the dose of her chemotherapy accordingly  Goals of care, counseling/discussion She is not a surgical candidate at this point However, if she have excellent response to therapy in the future, she might be a  surgical candidate For this reason, I will hold bevacizumab For now, the goal of therapy is curative in nature   Orders Placed This Encounter  Procedures  . CBC with Differential (Cancer Center Only)    Standing Status:   Standing    Number of Occurrences:   20    Standing Expiration Date:   02/01/2020  . CMP ([facility] only)    Standing Status:   Standing    Number of Occurrences:   20    Standing Expiration Date:   02/01/2020  . Sample to Blood Bank    Standing Status:   Standing    Number of Occurrences:   33    Standing Expiration Date:   02/01/2020    INTERVAL HISTORY: Please see below for problem oriented charting. She returns to review test results Since her last time I saw her, she felt better She have symptomatic relief after paracentesis She denies recent infection, fever or chills No recent cough No neuropathy from treatment  SUMMARY OF ONCOLOGIC HISTORY: Oncology History   MMR normal NTRK normal     Ovarian cancer ([place])   08/25/2018 Initial Diagnosis    Initially presented with nausea, vomiting and abdominal distension    08/25/2018 Imaging    CT scan demonstrated right pleural thickening and a cardiophrenic lymph node measuring 1.5 cm.  The liver demonstrated mild nodularity and scalloping of the right lobe.  There were 2 less than 1 cm lesions seen within the liver.  There was large volume ascites and a 13 cm upper abdominal left upper quadrant omental cake visualized.  There was extensive peritoneal thickening along the abdomen and pelvic peritoneum.  There is a 1.5 cm aortocaval lymph node identified.  No  pelvic adenopathy was identified.  In the pelvis the left adnexa revealed a 4.6 x 3.2 cm ill-defined solid and cystic structure.  No other large dominant pelvic masses identified.  A moderate hiatal hernia was seen and a small umbilical hernia containing fat was seen.    08/29/2018 Tumor Marker    Patient's tumor was tested for the following markers:  CA-125. Results of the tumor marker test revealed 1060    09/15/2018 Cancer Staging    Staging form: Ovary, Fallopian Tube, and Primary Peritoneal Carcinoma, AJCC 8th Edition - Clinical: Stage IV (cT3c, cN1, cM1) - Signed by Christy Lark, MD on 09/15/2018    09/15/2018 Tumor Marker    Patient's tumor was tested for the following markers: CA-125. Results of the tumor marker test revealed 1491    09/25/2018 Pathology Results    Omentum, biopsy - HIGH GRADE CARCINOMA, CONSISTENT WITH PAPILLARY SEROUS CARCINOMA. - SEE COMMENT. Microscopic Comment The malignant cells are positive for cytokeratin 7, estrogen receptor, p53, pax-8, and wt-1. They are negative for cytokeratin 20 and CDX-2. The histology, in conjunction with this immunohistochemical profile, supports the above diagnosis. Dr. Vicente Males has reviewed the case and concurs with this interpretation.     09/25/2018 Procedure    Successful 8 French right internal jugular vein power port placement with its tip at the SVC/RA junction.     09/26/2018 Imaging    1. Examination is positive for nodal metastasis within the anterior and posterior mediastinum as well as right CP angle. 2. There is a small amount of right pleural fluid and pleural soft tissue nodularity which may reflect pleural involvement by tumor. 3. Large volume of ascites with evidence of peritoneal carcinomatosis with omental caking. 4. Aortic Atherosclerosis (ICD10-I70.0) and Emphysema (ICD10-J43.9). 5. Multi vessel coronary artery atherosclerotic calcifications including left main disease     Genetic Testing    Patient has genetic testing done for MMR. Results revealed MMR - normal.    09/29/2018 -  Chemotherapy    The patient had carboplatin and taxol    09/29/2018 -  Chemotherapy    The patient had carboplatin and taxol for chemo    10/20/2018 Tumor Marker    Patient's tumor was tested for the following markers: CA-125. Results of the tumor marker test revealed  2146    10/25/2018 Procedure    Successful ultrasound-guided therapeutic paracentesis yielding 5 liters of peritoneal fluid.     11/14/2018 Procedure    Successful ultrasound-guided paracentesis yielding 2.2 liters of peritoneal fluid.    11/30/2018 Imaging    IMPRESSION: 1. Significant decrease in peritoneal carcinomatosis since previous study. Ascites has mildly increased. 2. Stable left adnexal mass. 3. Stable mild mediastinal lymphadenopathy. 4. No new or progressive metastatic disease identified. 5. Small to moderate hiatal hernia. Small paraumbilical and right inguinal hernias. 6. Colonic diverticulosis, without radiographic evidence of diverticulitis.    12/01/2018 Tumor Marker    Patient's tumor was tested for the following markers: CA-125. Results of the tumor marker test revealed 527    12/25/2018 Tumor Marker    Patient's tumor was tested for the following markers: CA-125. Results of the tumor marker test revealed 561    12/27/2018 Procedure    Successful ultrasound-guided paracentesis yielding 3.3 L of peritoneal fluid.    01/15/2019 Tumor Marker    Patient's tumor was tested for the following markers: CA-125. Results of the tumor marker test revealed 555    01/31/2019 Procedure    Successful ultrasound-guided paracentesis yielding 3.6 L  of peritoneal fluid.    01/31/2019 Imaging    CT CHEST IMPRESSION  1. Response to therapy within the chest. Stable and decreased size of thoracic nodes as detailed above. 2. Status post at least partial right lower lobectomy. Minimal right upper lobe nodularity is unchanged. 3. Similar tiny right pleural effusion. Necrotic node versus loculated pleural fluid about the inferior medial right chest, similar. 4. Aortic atherosclerosis (ICD10-I70.0), coronary artery atherosclerosis and emphysema (ICD10-J43.9).  CT ABDOMEN AND PELVIS IMPRESSION  1. Similar omental/peritoneal metastasis, with decrease in small volume anterior loculated  ascites. No obstruction or other acute complication. 2. Decreased size of left ovarian/adnexal mass. 3. Similar and decreased size of low-density left pelvic sidewall lesions. The left common iliac lesion is suspicious for a lymph node. The external iliac lesion may represent a postoperative seroma, given fluid density. 4. Uterine fibroids. 5. Probable right nephrolithiasis. 6. Probable chronic calcific pancreatitis, similar.    02/09/2019 -  Chemotherapy    The patient had palonosetron (ALOXI) injection 0.25 mg, 0.25 mg, Intravenous,  Once, 0 of 3 cycles CARBOplatin (PARAPLATIN) 290 mg in sodium chloride 0.9 % 100 mL chemo infusion, 290 mg (100 % of original dose 294 mg), Intravenous,  Once, 0 of 3 cycles Dose modification:   (original dose 294 mg, Cycle 1) gemcitabine (GEMZAR) 1,406 mg in sodium chloride 0.9 % 100 mL chemo infusion, 800 mg/m2 = 1,406 mg, Intravenous,  Once, 0 of 3 cycles fosaprepitant (EMEND) 150 mg, dexamethasone (DECADRON) 12 mg in sodium chloride 0.9 % 145 mL IVPB, , Intravenous,  Once, 0 of 3 cycles  for chemotherapy treatment.      REVIEW OF SYSTEMS:   Constitutional: Denies fevers, chills or abnormal weight loss Eyes: Denies blurriness of vision Ears, nose, mouth, throat, and face: Denies mucositis or sore throat Respiratory: Denies cough, dyspnea or wheezes Cardiovascular: Denies palpitation, chest discomfort or lower extremity swelling Gastrointestinal:  Denies nausea, heartburn or change in bowel habits Skin: Denies abnormal skin rashes Lymphatics: Denies new lymphadenopathy or easy bruising Neurological:Denies numbness, tingling or new weaknesses Behavioral/Psych: Mood is stable, no new changes  All other systems were reviewed with the patient and are negative.  I have reviewed the past medical history, past surgical history, social history and family history with the patient and they are unchanged from previous note.  ALLERGIES:  has No Known  Allergies.  MEDICATIONS:  Current Outpatient Medications  Medication Sig Dispense Refill  . allopurinol (ZYLOPRIM) 300 MG tablet Take 300 mg by mouth daily.    Marland Kitchen aspirin EC 81 MG tablet Take 81 mg by mouth daily.    Marland Kitchen atorvastatin (LIPITOR) 80 MG tablet Take 80 mg by mouth daily.    . benazepril (LOTENSIN) 20 MG tablet Take 20 mg by mouth daily.    . Cholecalciferol (VITAMIN D3 PO) Take 1,000 Units by mouth daily.    . cloNIDine (CATAPRES) 0.2 MG tablet Take 0.2 mg by mouth 2 (two) times daily.    Marland Kitchen diltiazem (CARDIZEM CD) 300 MG 24 hr capsule Take 300 mg by mouth daily.    Marland Kitchen levothyroxine (SYNTHROID, LEVOTHROID) 100 MCG tablet Take 100 mcg by mouth daily before breakfast.    . lidocaine-prilocaine (EMLA) cream Apply to affected area once 30 g 3  . metFORMIN (GLUCOPHAGE-XR) 500 MG 24 hr tablet Take 2,000 mg by mouth daily with breakfast.    . ondansetron (ZOFRAN) 8 MG tablet Take 1 tablet (8 mg total) by mouth every 8 (eight) hours as needed for refractory  nausea / vomiting. Start on day 3 after chemo. 30 tablet 1  . prochlorperazine (COMPAZINE) 10 MG tablet Take 1 tablet (10 mg total) by mouth every 6 (six) hours as needed (Nausea or vomiting). 30 tablet 1   No current facility-administered medications for this visit.     PHYSICAL EXAMINATION: ECOG PERFORMANCE STATUS: 1 - Symptomatic but completely ambulatory  Vitals:   02/01/19 1137  BP: (!) 113/56  Pulse: 84  Resp: 18  Temp: 98.3 F (36.8 C)  SpO2: 100%   Filed Weights   02/01/19 1137  Weight: 151 lb (68.5 kg)    GENERAL:alert, no distress and comfortable Musculoskeletal:no cyanosis of digits and no clubbing  NEURO: alert & oriented x 3 with fluent speech, no focal motor/sensory deficits  LABORATORY DATA:  I have reviewed the data as listed    Component Value Date/Time   NA 139 01/15/2019 0842   K 4.2 01/15/2019 0842   CL 107 01/15/2019 0842   CO2 19 (L) 01/15/2019 0842   GLUCOSE 348 (H) 01/15/2019 0842   BUN 13  01/15/2019 0842   CREATININE 1.20 (H) 01/15/2019 0842   CALCIUM 8.7 (L) 01/15/2019 0842   PROT 6.5 01/15/2019 0842   ALBUMIN 3.4 (L) 01/15/2019 0842   AST 22 01/15/2019 0842   ALT 25 01/15/2019 0842   ALKPHOS 118 01/15/2019 0842   BILITOT 0.2 (L) 01/15/2019 0842   GFRNONAA 46 (L) 01/15/2019 0842   GFRAA 54 (L) 01/15/2019 0842    No results found for: SPEP, UPEP  Lab Results  Component Value Date   WBC 4.0 01/15/2019   NEUTROABS 3.5 01/15/2019   HGB 9.9 (L) 01/15/2019   HCT 31.8 (L) 01/15/2019   MCV 92.4 01/15/2019   PLT 170 01/15/2019      Chemistry      Component Value Date/Time   NA 139 01/15/2019 0842   K 4.2 01/15/2019 0842   CL 107 01/15/2019 0842   CO2 19 (L) 01/15/2019 0842   BUN 13 01/15/2019 0842   CREATININE 1.20 (H) 01/15/2019 0842      Component Value Date/Time   CALCIUM 8.7 (L) 01/15/2019 0842   ALKPHOS 118 01/15/2019 0842   AST 22 01/15/2019 0842   ALT 25 01/15/2019 0842   BILITOT 0.2 (L) 01/15/2019 0842       RADIOGRAPHIC STUDIES: I have reviewed multiple imaging studies extensively with the patient I have personally reviewed the radiological images as listed and agreed with the findings in the report. Ct Chest W Contrast  Result Date: 01/31/2019 CLINICAL DATA:  Neoplasm of ovary. 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 Post primary therapy. Evaluate treatment response. Known malignant ascites. Remote history of lung cancer. EXAM: CT CHEST, ABDOMEN, AND PELVIS WITH CONTRAST TECHNIQUE: Multidetector CT imaging of the chest, abdomen and pelvis was performed following the standard protocol during bolus administration of intravenous contrast. CONTRAST:  114m OMNIPAQUE IOHEXOL 300 MG/ML  SOLN COMPARISON:  11/30/2018 FINDINGS: CT CHEST FINDINGS Cardiovascular: Advanced aortic and branch vessel atherosclerosis. Normal heart size, without pericardial effusion. Multivessel coronary artery atherosclerosis. No central pulmonary embolism,  on this non-dedicated study. Mediastinum/Nodes: Right thyroidectomy. No supraclavicular adenopathy. AP window index node measures 1.1 cm on image 20/3, similar. Low-density structure in the right posterior chest, including at 3.5 x 2.8 cm on image 28/3. Felt to be similar to 3.7 x 2.8 cm on the prior. Index prevascular node measures 1.3 cm on image 17/3 versus 1.4 cm on the prior. A more anterior prevascular node measures  1.2 cm on image 19/3 versus 1.3 cm on the prior (when remeasured). Right cardiophrenic angle node measures 11 mm on image 41/3 versus 12 mm on the prior. Lungs/Pleura: Trace right pleural fluid is similar. Moderate centrilobular emphysema. At least partial right lower lobectomy. Subpleural mild right upper lobe nodularity is similar, including at 5 mm on image 53/5. Musculoskeletal: Posterior right sixth rib defect is likely postsurgical. Presumed bone island within the T6 vertebral body CT ABDOMEN PELVIS FINDINGS Hepatobiliary: Too small to characterize right hepatic low-density lesions are less distinct today. No dominant liver lesion. Normal gallbladder, without biliary ductal dilatation. Pancreas: Pancreatic parenchymal calcifications within the head are similar on 62/3. No duct dilatation or acute inflammation. Spleen: Normal in size, without focal abnormality. Adrenals/Urinary Tract: Normal adrenal glands. Mild renal cortical thinning bilaterally. An interpolar left renal 4.6 cm cyst. Bilateral too small to characterize renal lesions. Bilateral renal vascular calcifications. Probable 4 mm upper pole right renal collecting system calculus. No hydronephrosis. Normal urinary bladder. Stomach/Bowel: Tiny hiatal hernia. Normal remainder of the stomach. Scattered colonic diverticula. Normal terminal ileum. Normal small bowel caliber. Vascular/Lymphatic: Aortic and branch vessel atherosclerosis. No abdominal adenopathy. Left external iliac low-density structure of 1.1 cm on image 99/3 is unchanged.  A 1.2 cm left common iliac soft tissue density is favored to represent an enlarged lymph node on image 91/3. 1.3 cm on the prior and 1.2 cm on 08/25/2018 (when remeasured). Reproductive: Presumed uterine fibroids, including at 3.2 cm. Soft tissue fullness in the region of the left ovary/adnexa. On the order of 2.8 x 3.5 cm on image 98/3. Compare 3.2 x 4.4 cm on the prior. Other: Primarily anterior, presumably loculated abdominal ascites is decreased compared to 11/30/2018. Omental soft tissue thickening, including at 2.2 cm on image 53/3. More inferiorly, on the order of 1.7 cm on image 62/3. Felt to be grossly similar to on the prior. Musculoskeletal: Vague sclerotic lesion within the L3 vertebral body is similar. Degenerative sclerosis within the L5 vertebral body. IMPRESSION: CT CHEST IMPRESSION 1. Response to therapy within the chest. Stable and decreased size of thoracic nodes as detailed above. 2. Status post at least partial right lower lobectomy. Minimal right upper lobe nodularity is unchanged. 3. Similar tiny right pleural effusion. Necrotic node versus loculated pleural fluid about the inferior medial right chest, similar. 4. Aortic atherosclerosis (ICD10-I70.0), coronary artery atherosclerosis and emphysema (ICD10-J43.9). CT ABDOMEN AND PELVIS IMPRESSION 1. Similar omental/peritoneal metastasis, with decrease in small volume anterior loculated ascites. No obstruction or other acute complication. 2. Decreased size of left ovarian/adnexal mass. 3. Similar and decreased size of low-density left pelvic sidewall lesions. The left common iliac lesion is suspicious for a lymph node. The external iliac lesion may represent a postoperative seroma, given fluid density. 4. Uterine fibroids. 5. Probable right nephrolithiasis. 6. Probable chronic calcific pancreatitis, similar. Electronically Signed   By: Abigail Miyamoto M.D.   On: 01/31/2019 16:11   Ct Abdomen Pelvis W Contrast  Result Date: 01/31/2019 CLINICAL  DATA:  Neoplasm of ovary. 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 Post primary therapy. Evaluate treatment response. Known malignant ascites. Remote history of lung cancer. EXAM: CT CHEST, ABDOMEN, AND PELVIS WITH CONTRAST TECHNIQUE: Multidetector CT imaging of the chest, abdomen and pelvis was performed following the standard protocol during bolus administration of intravenous contrast. CONTRAST:  178m OMNIPAQUE IOHEXOL 300 MG/ML  SOLN COMPARISON:  11/30/2018 FINDINGS: CT CHEST FINDINGS Cardiovascular: Advanced aortic and branch vessel atherosclerosis. Normal heart size, without pericardial effusion. Multivessel coronary artery  atherosclerosis. No central pulmonary embolism, on this non-dedicated study. Mediastinum/Nodes: Right thyroidectomy. No supraclavicular adenopathy. AP window index node measures 1.1 cm on image 20/3, similar. Low-density structure in the right posterior chest, including at 3.5 x 2.8 cm on image 28/3. Felt to be similar to 3.7 x 2.8 cm on the prior. Index prevascular node measures 1.3 cm on image 17/3 versus 1.4 cm on the prior. A more anterior prevascular node measures 1.2 cm on image 19/3 versus 1.3 cm on the prior (when remeasured). Right cardiophrenic angle node measures 11 mm on image 41/3 versus 12 mm on the prior. Lungs/Pleura: Trace right pleural fluid is similar. Moderate centrilobular emphysema. At least partial right lower lobectomy. Subpleural mild right upper lobe nodularity is similar, including at 5 mm on image 53/5. Musculoskeletal: Posterior right sixth rib defect is likely postsurgical. Presumed bone island within the T6 vertebral body CT ABDOMEN PELVIS FINDINGS Hepatobiliary: Too small to characterize right hepatic low-density lesions are less distinct today. No dominant liver lesion. Normal gallbladder, without biliary ductal dilatation. Pancreas: Pancreatic parenchymal calcifications within the head are similar on 62/3. No duct dilatation or acute  inflammation. Spleen: Normal in size, without focal abnormality. Adrenals/Urinary Tract: Normal adrenal glands. Mild renal cortical thinning bilaterally. An interpolar left renal 4.6 cm cyst. Bilateral too small to characterize renal lesions. Bilateral renal vascular calcifications. Probable 4 mm upper pole right renal collecting system calculus. No hydronephrosis. Normal urinary bladder. Stomach/Bowel: Tiny hiatal hernia. Normal remainder of the stomach. Scattered colonic diverticula. Normal terminal ileum. Normal small bowel caliber. Vascular/Lymphatic: Aortic and branch vessel atherosclerosis. No abdominal adenopathy. Left external iliac low-density structure of 1.1 cm on image 99/3 is unchanged. A 1.2 cm left common iliac soft tissue density is favored to represent an enlarged lymph node on image 91/3. 1.3 cm on the prior and 1.2 cm on 08/25/2018 (when remeasured). Reproductive: Presumed uterine fibroids, including at 3.2 cm. Soft tissue fullness in the region of the left ovary/adnexa. On the order of 2.8 x 3.5 cm on image 98/3. Compare 3.2 x 4.4 cm on the prior. Other: Primarily anterior, presumably loculated abdominal ascites is decreased compared to 11/30/2018. Omental soft tissue thickening, including at 2.2 cm on image 53/3. More inferiorly, on the order of 1.7 cm on image 62/3. Felt to be grossly similar to on the prior. Musculoskeletal: Vague sclerotic lesion within the L3 vertebral body is similar. Degenerative sclerosis within the L5 vertebral body. IMPRESSION: CT CHEST IMPRESSION 1. Response to therapy within the chest. Stable and decreased size of thoracic nodes as detailed above. 2. Status post at least partial right lower lobectomy. Minimal right upper lobe nodularity is unchanged. 3. Similar tiny right pleural effusion. Necrotic node versus loculated pleural fluid about the inferior medial right chest, similar. 4. Aortic atherosclerosis (ICD10-I70.0), coronary artery atherosclerosis and emphysema  (ICD10-J43.9). CT ABDOMEN AND PELVIS IMPRESSION 1. Similar omental/peritoneal metastasis, with decrease in small volume anterior loculated ascites. No obstruction or other acute complication. 2. Decreased size of left ovarian/adnexal mass. 3. Similar and decreased size of low-density left pelvic sidewall lesions. The left common iliac lesion is suspicious for a lymph node. The external iliac lesion may represent a postoperative seroma, given fluid density. 4. Uterine fibroids. 5. Probable right nephrolithiasis. 6. Probable chronic calcific pancreatitis, similar. Electronically Signed   By: Abigail Miyamoto M.D.   On: 01/31/2019 16:11   Ir Paracentesis  Result Date: 01/31/2019 INDICATION: Patient with history of ovarian cancer and recurrent malignant ascites. Request is made for therapeutic  paracentesis with a max of 5 L. EXAM: ULTRASOUND GUIDED THERAPEUTIC PARACENTESIS MEDICATIONS: 10 mL 1% lidocaine COMPLICATIONS: None immediate. PROCEDURE: Informed written consent was obtained from the patient after a discussion of the risks, benefits and alternatives to treatment. A timeout was performed prior to the initiation of the procedure. Initial ultrasound scanning demonstrates a large amount of ascites within the left lower abdominal quadrant. The left lower abdomen was prepped and draped in the usual sterile fashion. 1% lidocaine was used for local anesthesia. Following this, a 19 gauge, 7-cm, Yueh catheter was introduced. An ultrasound image was saved for documentation purposes. The paracentesis was performed. The catheter was removed and a dressing was applied. The patient tolerated the procedure well without immediate post procedural complication. FINDINGS: A total of approximately 3.6 L of clear yellow fluid was removed. IMPRESSION: Successful ultrasound-guided paracentesis yielding 3.6 L of peritoneal fluid. Read by: Earley Abide, PA-C Electronically Signed   By: Markus Daft M.D.   On: 01/31/2019 15:00    All  questions were answered. The patient knows to call the clinic with any problems, questions or concerns. No barriers to learning was detected.  I spent 30 minutes counseling the patient face to face. The total time spent in the appointment was 40 minutes and more than 50% was on counseling and review of test results  Christy Lark, MD 02/01/2019 1:50 PM

## 2019-02-01 NOTE — Assessment & Plan Note (Signed)
She has progressive pancytopenia due to treatment but not symptomatic She is aware she might need transfusion support in the future We will monitor her blood counts carefully while on treatment

## 2019-02-01 NOTE — Assessment & Plan Note (Signed)
She is not a surgical candidate at this point However, if she have excellent response to therapy in the future, she might be a surgical candidate For this reason, I will hold bevacizumab For now, the goal of therapy is curative in nature

## 2019-02-02 ENCOUNTER — Telehealth: Payer: Self-pay | Admitting: Hematology and Oncology

## 2019-02-02 NOTE — Telephone Encounter (Signed)
Called regarding schedule °

## 2019-02-09 ENCOUNTER — Other Ambulatory Visit: Payer: Self-pay

## 2019-02-09 ENCOUNTER — Inpatient Hospital Stay: Payer: Medicare HMO | Attending: Gynecologic Oncology

## 2019-02-09 ENCOUNTER — Inpatient Hospital Stay: Payer: Medicare HMO

## 2019-02-09 VITALS — BP 112/57 | HR 80 | Temp 97.9°F | Resp 18 | Wt 155.8 lb

## 2019-02-09 DIAGNOSIS — R180 Malignant ascites: Secondary | ICD-10-CM | POA: Diagnosis not present

## 2019-02-09 DIAGNOSIS — C569 Malignant neoplasm of unspecified ovary: Secondary | ICD-10-CM

## 2019-02-09 DIAGNOSIS — E1122 Type 2 diabetes mellitus with diabetic chronic kidney disease: Secondary | ICD-10-CM | POA: Diagnosis not present

## 2019-02-09 DIAGNOSIS — Z5111 Encounter for antineoplastic chemotherapy: Secondary | ICD-10-CM | POA: Insufficient documentation

## 2019-02-09 DIAGNOSIS — Z85118 Personal history of other malignant neoplasm of bronchus and lung: Secondary | ICD-10-CM | POA: Diagnosis not present

## 2019-02-09 DIAGNOSIS — Z7982 Long term (current) use of aspirin: Secondary | ICD-10-CM | POA: Insufficient documentation

## 2019-02-09 DIAGNOSIS — I700 Atherosclerosis of aorta: Secondary | ICD-10-CM | POA: Insufficient documentation

## 2019-02-09 DIAGNOSIS — Z7984 Long term (current) use of oral hypoglycemic drugs: Secondary | ICD-10-CM | POA: Insufficient documentation

## 2019-02-09 DIAGNOSIS — I2510 Atherosclerotic heart disease of native coronary artery without angina pectoris: Secondary | ICD-10-CM | POA: Insufficient documentation

## 2019-02-09 DIAGNOSIS — D61818 Other pancytopenia: Secondary | ICD-10-CM | POA: Diagnosis not present

## 2019-02-09 DIAGNOSIS — Z79899 Other long term (current) drug therapy: Secondary | ICD-10-CM | POA: Diagnosis not present

## 2019-02-09 DIAGNOSIS — N183 Chronic kidney disease, stage 3 (moderate): Secondary | ICD-10-CM | POA: Diagnosis not present

## 2019-02-09 DIAGNOSIS — C786 Secondary malignant neoplasm of retroperitoneum and peritoneum: Secondary | ICD-10-CM | POA: Insufficient documentation

## 2019-02-09 LAB — CBC WITH DIFFERENTIAL (CANCER CENTER ONLY)
Abs Immature Granulocytes: 0.02 10*3/uL (ref 0.00–0.07)
Basophils Absolute: 0 10*3/uL (ref 0.0–0.1)
Basophils Relative: 0 %
Eosinophils Absolute: 0.1 10*3/uL (ref 0.0–0.5)
Eosinophils Relative: 3 %
HCT: 29.4 % — ABNORMAL LOW (ref 36.0–46.0)
Hemoglobin: 9.3 g/dL — ABNORMAL LOW (ref 12.0–15.0)
Immature Granulocytes: 0 %
Lymphocytes Relative: 20 %
Lymphs Abs: 0.9 10*3/uL (ref 0.7–4.0)
MCH: 29.2 pg (ref 26.0–34.0)
MCHC: 31.6 g/dL (ref 30.0–36.0)
MCV: 92.2 fL (ref 80.0–100.0)
Monocytes Absolute: 0.4 10*3/uL (ref 0.1–1.0)
Monocytes Relative: 8 %
Neutro Abs: 3.1 10*3/uL (ref 1.7–7.7)
Neutrophils Relative %: 69 %
Platelet Count: 181 10*3/uL (ref 150–400)
RBC: 3.19 MIL/uL — ABNORMAL LOW (ref 3.87–5.11)
RDW: 14.7 % (ref 11.5–15.5)
WBC Count: 4.6 10*3/uL (ref 4.0–10.5)
nRBC: 0 % (ref 0.0–0.2)

## 2019-02-09 LAB — CMP (CANCER CENTER ONLY)
ALT: 15 U/L (ref 0–44)
AST: 20 U/L (ref 15–41)
Albumin: 3.1 g/dL — ABNORMAL LOW (ref 3.5–5.0)
Alkaline Phosphatase: 95 U/L (ref 38–126)
Anion gap: 9 (ref 5–15)
BUN: 15 mg/dL (ref 8–23)
CO2: 21 mmol/L — ABNORMAL LOW (ref 22–32)
Calcium: 8.7 mg/dL — ABNORMAL LOW (ref 8.9–10.3)
Chloride: 111 mmol/L (ref 98–111)
Creatinine: 1.02 mg/dL — ABNORMAL HIGH (ref 0.44–1.00)
GFR, Est AFR Am: >60 mL/min (ref >60)
GFR, Estimated: 56 mL/min — ABNORMAL LOW (ref >60)
Glucose, Bld: 110 mg/dL — ABNORMAL HIGH (ref 70–99)
Potassium: 4.4 mmol/L (ref 3.5–5.1)
Sodium: 141 mmol/L (ref 135–145)
Total Bilirubin: 0.2 mg/dL — ABNORMAL LOW (ref 0.3–1.2)
Total Protein: 5.7 g/dL — ABNORMAL LOW (ref 6.5–8.1)

## 2019-02-09 LAB — SAMPLE TO BLOOD BANK

## 2019-02-09 MED ORDER — PALONOSETRON HCL INJECTION 0.25 MG/5ML
INTRAVENOUS | Status: AC
  Filled 2019-02-09: qty 5

## 2019-02-09 MED ORDER — PALONOSETRON HCL INJECTION 0.25 MG/5ML
0.2500 mg | Freq: Once | INTRAVENOUS | Status: AC
  Administered 2019-02-09: 0.25 mg via INTRAVENOUS

## 2019-02-09 MED ORDER — SODIUM CHLORIDE 0.9 % IV SOLN
Freq: Once | INTRAVENOUS | Status: AC
  Administered 2019-02-09: 09:00:00 via INTRAVENOUS
  Filled 2019-02-09: qty 250

## 2019-02-09 MED ORDER — SODIUM CHLORIDE 0.9 % IV SOLN
Freq: Once | INTRAVENOUS | Status: AC
  Administered 2019-02-09: 10:00:00 via INTRAVENOUS
  Filled 2019-02-09: qty 5

## 2019-02-09 MED ORDER — FAMOTIDINE IN NACL 20-0.9 MG/50ML-% IV SOLN
INTRAVENOUS | Status: AC
  Filled 2019-02-09: qty 50

## 2019-02-09 MED ORDER — SODIUM CHLORIDE 0.9% FLUSH
10.0000 mL | INTRAVENOUS | Status: DC | PRN
  Administered 2019-02-09: 10 mL
  Filled 2019-02-09: qty 10

## 2019-02-09 MED ORDER — FAMOTIDINE IN NACL 20-0.9 MG/50ML-% IV SOLN
20.0000 mg | Freq: Once | INTRAVENOUS | Status: AC
  Administered 2019-02-09: 20 mg via INTRAVENOUS

## 2019-02-09 MED ORDER — HEPARIN SOD (PORK) LOCK FLUSH 100 UNIT/ML IV SOLN
500.0000 [IU] | Freq: Once | INTRAVENOUS | Status: AC | PRN
  Administered 2019-02-09: 500 [IU]
  Filled 2019-02-09: qty 5

## 2019-02-09 MED ORDER — SODIUM CHLORIDE 0.9% FLUSH
10.0000 mL | Freq: Once | INTRAVENOUS | Status: AC
  Administered 2019-02-09: 10 mL
  Filled 2019-02-09: qty 10

## 2019-02-09 MED ORDER — DIPHENHYDRAMINE HCL 50 MG/ML IJ SOLN
INTRAMUSCULAR | Status: AC
  Filled 2019-02-09: qty 1

## 2019-02-09 MED ORDER — SODIUM CHLORIDE 0.9 % IV SOLN
800.0000 mg/m2 | Freq: Once | INTRAVENOUS | Status: AC
  Administered 2019-02-09: 1406 mg via INTRAVENOUS
  Filled 2019-02-09: qty 36.98

## 2019-02-09 MED ORDER — SODIUM CHLORIDE 0.9 % IV SOLN
328.4000 mg | Freq: Once | INTRAVENOUS | Status: AC
  Administered 2019-02-09: 330 mg via INTRAVENOUS
  Filled 2019-02-09: qty 33

## 2019-02-09 MED ORDER — DIPHENHYDRAMINE HCL 50 MG/ML IJ SOLN
25.0000 mg | Freq: Once | INTRAMUSCULAR | Status: AC
  Administered 2019-02-09: 25 mg via INTRAVENOUS

## 2019-02-09 NOTE — Patient Instructions (Signed)
[facility] Discharge Instructions for Patients Receiving Chemotherapy  Today you received the following chemotherapy agents: Gemzar & Carboplatin.  To help prevent nausea and vomiting after your treatment, we encourage you to take your nausea medication as prescribed.  If you develop nausea and vomiting that is not controlled by your nausea medication, call the clinic.   BELOW ARE SYMPTOMS THAT SHOULD BE REPORTED IMMEDIATELY:  *FEVER GREATER THAN 100.5 F  *CHILLS WITH OR WITHOUT FEVER  NAUSEA AND VOMITING THAT IS NOT CONTROLLED WITH YOUR NAUSEA MEDICATION  *UNUSUAL SHORTNESS OF BREATH  *UNUSUAL BRUISING OR BLEEDING  TENDERNESS IN MOUTH AND THROAT WITH OR WITHOUT PRESENCE OF ULCERS  *URINARY PROBLEMS  *BOWEL PROBLEMS  UNUSUAL RASH Items with * indicate a potential emergency and should be followed up as soon as possible.  Feel free to call the clinic should you have any questions or concerns. The clinic phone number is [phone].  Please show the [facility] at check-in to the Emergency Department and triage nurse. Gemcitabine injection What is this medicine? GEMCITABINE (jem SYE ta been) is a chemotherapy drug. This medicine is used to treat many types of cancer like breast cancer, lung cancer, pancreatic cancer, and ovarian cancer. This medicine may be used for other purposes; ask your health care provider or pharmacist if you have questions. COMMON BRAND NAME(S): Gemzar, Infugem What should I tell my health care provider before I take this medicine? They need to know if you have any of these conditions: -blood disorders -infection -kidney disease -liver disease -lung or breathing disease, like asthma -recent or ongoing radiation therapy -an unusual or allergic reaction to gemcitabine, other chemotherapy, other medicines, foods, dyes, or preservatives -pregnant or trying to get pregnant -breast-feeding How should I use this medicine? This  drug is given as an infusion into a vein. It is administered in a hospital or clinic by a specially trained health care professional. Talk to your pediatrician regarding the use of this medicine in children. Special care may be needed. Overdosage: If you think you have taken too much of this medicine contact a poison control center or emergency room at once. NOTE: This medicine is only for you. Do not share this medicine with others. What if I miss a dose? It is important not to miss your dose. Call your doctor or health care professional if you are unable to keep an appointment. What may interact with this medicine? -medicines to increase blood counts like filgrastim, pegfilgrastim, sargramostim -some other chemotherapy drugs like cisplatin -vaccines Talk to your doctor or health care professional before taking any of these medicines: -acetaminophen -aspirin -ibuprofen -ketoprofen -naproxen This list may not describe all possible interactions. Give your health care provider a list of all the medicines, herbs, non-prescription drugs, or dietary supplements you use. Also tell them if you smoke, drink alcohol, or use illegal drugs. Some items may interact with your medicine. What should I watch for while using this medicine? Visit your doctor for checks on your progress. This drug may make you feel generally unwell. This is not uncommon, as chemotherapy can affect healthy cells as well as cancer cells. Report any side effects. Continue your course of treatment even though you feel ill unless your doctor tells you to stop. In some cases, you may be given additional medicines to help with side effects. Follow all directions for their use. Call your doctor or health care professional for advice if you get a fever, chills or sore throat,  or other symptoms of a cold or flu. Do not treat yourself. This drug decreases your body's ability to fight infections. Try to avoid being around people who are  sick. This medicine may increase your risk to bruise or bleed. Call your doctor or health care professional if you notice any unusual bleeding. Be careful brushing and flossing your teeth or using a toothpick because you may get an infection or bleed more easily. If you have any dental work done, tell your dentist you are receiving this medicine. Avoid taking products that contain aspirin, acetaminophen, ibuprofen, naproxen, or ketoprofen unless instructed by your doctor. These medicines may hide a fever. Do not become pregnant while taking this medicine or for 6 months after stopping it. Women should inform their doctor if they wish to become pregnant or think they might be pregnant. Men should not father a child while taking this medicine and for 3 months after stopping it. There is a potential for serious side effects to an unborn child. Talk to your health care professional or pharmacist for more information. Do not breast-feed an infant while taking this medicine or for at least 1 week after stopping it. Men should inform their doctors if they wish to father a child. This medicine may lower sperm counts. Talk with your doctor or health care professional if you are concerned about your fertility. What side effects may I notice from receiving this medicine? Side effects that you should report to your doctor or health care professional as soon as possible: -allergic reactions like skin rash, itching or hives, swelling of the face, lips, or tongue -breathing problems -pain, redness, or irritation at site where injected -signs and symptoms of a dangerous change in heartbeat or heart rhythm like chest pain; dizziness; fast or irregular heartbeat; palpitations; feeling faint or lightheaded, falls; breathing problems -signs of decreased platelets or bleeding - bruising, pinpoint red spots on the skin, black, tarry stools, blood in the urine -signs of decreased red blood cells - unusually weak or tired,  feeling faint or lightheaded, falls -signs of infection - fever or chills, cough, sore throat, pain or difficulty passing urine -signs and symptoms of kidney injury like trouble passing urine or change in the amount of urine -signs and symptoms of liver injury like dark yellow or brown urine; general ill feeling or flu-like symptoms; light-colored stools; loss of appetite; nausea; right upper belly pain; unusually weak or tired; yellowing of the eyes or skin -swelling of ankles, feet, hands Side effects that usually do not require medical attention (report to your doctor or health care professional if they continue or are bothersome): -constipation -diarrhea -hair loss -loss of appetite -nausea -rash -vomiting This list may not describe all possible side effects. Call your doctor for medical advice about side effects. You may report side effects to FDA at 1-800-FDA-1088. Where should I keep my medicine? This drug is given in a hospital or clinic and will not be stored at home. NOTE: This sheet is a summary. It may not cover all possible information. If you have questions about this medicine, talk to your doctor, pharmacist, or health care provider.  2019 Elsevier/Gold Standard (2017-11-16 18:06:11)  Carboplatin injection What is this medicine? CARBOPLATIN (KAR boe pla tin) is a chemotherapy drug. It targets fast dividing cells, like cancer cells, and causes these cells to die. This medicine is used to treat ovarian cancer and many other cancers. This medicine may be used for other purposes; ask your health care provider  or pharmacist if you have questions. COMMON BRAND NAME(S): Paraplatin What should I tell my health care provider before I take this medicine? They need to know if you have any of these conditions: -blood disorders -hearing problems -kidney disease -recent or ongoing radiation therapy -an unusual or allergic reaction to carboplatin, cisplatin, other chemotherapy, other  medicines, foods, dyes, or preservatives -pregnant or trying to get pregnant -breast-feeding How should I use this medicine? This drug is usually given as an infusion into a vein. It is administered in a hospital or clinic by a specially trained health care professional. Talk to your pediatrician regarding the use of this medicine in children. Special care may be needed. Overdosage: If you think you have taken too much of this medicine contact a poison control center or emergency room at once. NOTE: This medicine is only for you. Do not share this medicine with others. What if I miss a dose? It is important not to miss a dose. Call your doctor or health care professional if you are unable to keep an appointment. What may interact with this medicine? -medicines for seizures -medicines to increase blood counts like filgrastim, pegfilgrastim, sargramostim -some antibiotics like amikacin, gentamicin, neomycin, streptomycin, tobramycin -vaccines Talk to your doctor or health care professional before taking any of these medicines: -acetaminophen -aspirin -ibuprofen -ketoprofen -naproxen This list may not describe all possible interactions. Give your health care provider a list of all the medicines, herbs, non-prescription drugs, or dietary supplements you use. Also tell them if you smoke, drink alcohol, or use illegal drugs. Some items may interact with your medicine. What should I watch for while using this medicine? Your condition will be monitored carefully while you are receiving this medicine. You will need important blood work done while you are taking this medicine. This drug may make you feel generally unwell. This is not uncommon, as chemotherapy can affect healthy cells as well as cancer cells. Report any side effects. Continue your course of treatment even though you feel ill unless your doctor tells you to stop. In some cases, you may be given additional medicines to help with side  effects. Follow all directions for their use. Call your doctor or health care professional for advice if you get a fever, chills or sore throat, or other symptoms of a cold or flu. Do not treat yourself. This drug decreases your body's ability to fight infections. Try to avoid being around people who are sick. This medicine may increase your risk to bruise or bleed. Call your doctor or health care professional if you notice any unusual bleeding. Be careful brushing and flossing your teeth or using a toothpick because you may get an infection or bleed more easily. If you have any dental work done, tell your dentist you are receiving this medicine. Avoid taking products that contain aspirin, acetaminophen, ibuprofen, naproxen, or ketoprofen unless instructed by your doctor. These medicines may hide a fever. Do not become pregnant while taking this medicine. Women should inform their doctor if they wish to become pregnant or think they might be pregnant. There is a potential for serious side effects to an unborn child. Talk to your health care professional or pharmacist for more information. Do not breast-feed an infant while taking this medicine. What side effects may I notice from receiving this medicine? Side effects that you should report to your doctor or health care professional as soon as possible: -allergic reactions like skin rash, itching or hives, swelling of the face, lips,  or tongue -signs of infection - fever or chills, cough, sore throat, pain or difficulty passing urine -signs of decreased platelets or bleeding - bruising, pinpoint red spots on the skin, black, tarry stools, nosebleeds -signs of decreased red blood cells - unusually weak or tired, fainting spells, lightheadedness -breathing problems -changes in hearing -changes in vision -chest pain -high blood pressure -low blood counts - This drug may decrease the number of white blood cells, red blood cells and platelets. You may be  at increased risk for infections and bleeding. -nausea and vomiting -pain, swelling, redness or irritation at the injection site -pain, tingling, numbness in the hands or feet -problems with balance, talking, walking -trouble passing urine or change in the amount of urine Side effects that usually do not require medical attention (report to your doctor or health care professional if they continue or are bothersome): -hair loss -loss of appetite -metallic taste in the mouth or changes in taste This list may not describe all possible side effects. Call your doctor for medical advice about side effects. You may report side effects to FDA at 1-800-FDA-1088. Where should I keep my medicine? This drug is given in a hospital or clinic and will not be stored at home. NOTE: This sheet is a summary. It may not cover all possible information. If you have questions about this medicine, talk to your doctor, pharmacist, or health care provider.  2019 Elsevier/Gold Standard (2007-11-28 14:38:05)

## 2019-02-10 LAB — CA 125: Cancer Antigen (CA) 125: 646 U/mL — ABNORMAL HIGH (ref 0.0–38.1)

## 2019-02-12 ENCOUNTER — Telehealth: Payer: Self-pay | Admitting: *Deleted

## 2019-02-12 NOTE — Telephone Encounter (Signed)
[facility] for chemotherapy F/U.  Patient is doing well.  Denies n/v.  Denies any new side effects or symptoms.  Bowel and bladder functioning well with LBM 02-11-2019.  Eating and drinking well.  Instructed to drink 64 oz minimum daily or at least the day before, of and after treatment.   Denies questions or needs at this time.  Encouraged to call [phone] Mon -Fri 8:00 am - 4:30 pm or anytime as needed for symptoms, changes or event outside office hours.

## 2019-02-12 NOTE — Telephone Encounter (Signed)
-----   Message from Flo Shanks, RN sent at 02/09/2019 11:43 AM EDT ----- Regarding: Dr. [name] First time chemo Please call and follow up after first time Gemzar.  Thanks

## 2019-02-16 ENCOUNTER — Inpatient Hospital Stay: Payer: Medicare HMO

## 2019-02-16 ENCOUNTER — Encounter: Payer: Self-pay | Admitting: Hematology and Oncology

## 2019-02-16 ENCOUNTER — Other Ambulatory Visit: Payer: Self-pay

## 2019-02-16 ENCOUNTER — Inpatient Hospital Stay (HOSPITAL_BASED_OUTPATIENT_CLINIC_OR_DEPARTMENT_OTHER): Payer: Medicare HMO | Admitting: Hematology and Oncology

## 2019-02-16 DIAGNOSIS — C569 Malignant neoplasm of unspecified ovary: Secondary | ICD-10-CM | POA: Diagnosis not present

## 2019-02-16 DIAGNOSIS — I700 Atherosclerosis of aorta: Secondary | ICD-10-CM | POA: Diagnosis not present

## 2019-02-16 DIAGNOSIS — Z7984 Long term (current) use of oral hypoglycemic drugs: Secondary | ICD-10-CM

## 2019-02-16 DIAGNOSIS — C786 Secondary malignant neoplasm of retroperitoneum and peritoneum: Secondary | ICD-10-CM

## 2019-02-16 DIAGNOSIS — N183 Chronic kidney disease, stage 3 unspecified: Secondary | ICD-10-CM

## 2019-02-16 DIAGNOSIS — Z85118 Personal history of other malignant neoplasm of bronchus and lung: Secondary | ICD-10-CM

## 2019-02-16 DIAGNOSIS — Z79899 Other long term (current) drug therapy: Secondary | ICD-10-CM

## 2019-02-16 DIAGNOSIS — I2510 Atherosclerotic heart disease of native coronary artery without angina pectoris: Secondary | ICD-10-CM | POA: Diagnosis not present

## 2019-02-16 DIAGNOSIS — Z5111 Encounter for antineoplastic chemotherapy: Secondary | ICD-10-CM | POA: Diagnosis not present

## 2019-02-16 DIAGNOSIS — R180 Malignant ascites: Secondary | ICD-10-CM

## 2019-02-16 DIAGNOSIS — D61818 Other pancytopenia: Secondary | ICD-10-CM

## 2019-02-16 DIAGNOSIS — E1122 Type 2 diabetes mellitus with diabetic chronic kidney disease: Secondary | ICD-10-CM | POA: Diagnosis not present

## 2019-02-16 DIAGNOSIS — Z7982 Long term (current) use of aspirin: Secondary | ICD-10-CM

## 2019-02-16 LAB — CMP (CANCER CENTER ONLY)
ALT: 21 U/L (ref 0–44)
AST: 25 U/L (ref 15–41)
Albumin: 3.1 g/dL — ABNORMAL LOW (ref 3.5–5.0)
Alkaline Phosphatase: 102 U/L (ref 38–126)
Anion gap: 10 (ref 5–15)
BUN: 12 mg/dL (ref 8–23)
CO2: 21 mmol/L — ABNORMAL LOW (ref 22–32)
Calcium: 8.6 mg/dL — ABNORMAL LOW (ref 8.9–10.3)
Chloride: 110 mmol/L (ref 98–111)
Creatinine: 1.01 mg/dL — ABNORMAL HIGH (ref 0.44–1.00)
GFR, Est AFR Am: >60 mL/min (ref >60)
GFR, Estimated: 57 mL/min — ABNORMAL LOW (ref >60)
Glucose, Bld: 100 mg/dL — ABNORMAL HIGH (ref 70–99)
Potassium: 4.1 mmol/L (ref 3.5–5.1)
Sodium: 141 mmol/L (ref 135–145)
Total Bilirubin: <0.2 mg/dL — ABNORMAL LOW (ref 0.3–1.2)
Total Protein: 5.9 g/dL — ABNORMAL LOW (ref 6.5–8.1)

## 2019-02-16 LAB — CBC WITH DIFFERENTIAL (CANCER CENTER ONLY)
Abs Immature Granulocytes: 0.01 10*3/uL (ref 0.00–0.07)
Basophils Absolute: 0 10*3/uL (ref 0.0–0.1)
Basophils Relative: 0 %
Eosinophils Absolute: 0.1 10*3/uL (ref 0.0–0.5)
Eosinophils Relative: 3 %
HCT: 28.5 % — ABNORMAL LOW (ref 36.0–46.0)
Hemoglobin: 9.1 g/dL — ABNORMAL LOW (ref 12.0–15.0)
Immature Granulocytes: 0 %
Lymphocytes Relative: 42 %
Lymphs Abs: 1 10*3/uL (ref 0.7–4.0)
MCH: 29.6 pg (ref 26.0–34.0)
MCHC: 31.9 g/dL (ref 30.0–36.0)
MCV: 92.8 fL (ref 80.0–100.0)
Monocytes Absolute: 0.1 10*3/uL (ref 0.1–1.0)
Monocytes Relative: 3 %
Neutro Abs: 1.2 10*3/uL — ABNORMAL LOW (ref 1.7–7.7)
Neutrophils Relative %: 52 %
Platelet Count: 124 10*3/uL — ABNORMAL LOW (ref 150–400)
RBC: 3.07 MIL/uL — ABNORMAL LOW (ref 3.87–5.11)
RDW: 14.2 % (ref 11.5–15.5)
WBC Count: 2.3 10*3/uL — ABNORMAL LOW (ref 4.0–10.5)
nRBC: 0 % (ref 0.0–0.2)

## 2019-02-16 LAB — SAMPLE TO BLOOD BANK

## 2019-02-16 MED ORDER — PROCHLORPERAZINE MALEATE 10 MG PO TABS
10.0000 mg | ORAL_TABLET | Freq: Once | ORAL | Status: AC
  Administered 2019-02-16: 10 mg via ORAL

## 2019-02-16 MED ORDER — SODIUM CHLORIDE 0.9% FLUSH
10.0000 mL | INTRAVENOUS | Status: DC | PRN
  Administered 2019-02-16: 12:00:00 10 mL
  Filled 2019-02-16: qty 10

## 2019-02-16 MED ORDER — PROCHLORPERAZINE MALEATE 10 MG PO TABS
ORAL_TABLET | ORAL | Status: AC
  Filled 2019-02-16: qty 1

## 2019-02-16 MED ORDER — SODIUM CHLORIDE 0.9 % IV SOLN
Freq: Once | INTRAVENOUS | Status: AC
  Administered 2019-02-16: 11:00:00 via INTRAVENOUS
  Filled 2019-02-16: qty 250

## 2019-02-16 MED ORDER — HEPARIN SOD (PORK) LOCK FLUSH 100 UNIT/ML IV SOLN
500.0000 [IU] | Freq: Once | INTRAVENOUS | Status: AC | PRN
  Administered 2019-02-16: 500 [IU]
  Filled 2019-02-16: qty 5

## 2019-02-16 MED ORDER — SODIUM CHLORIDE 0.9 % IV SOLN: 600.0000 mg/m2 | Freq: Once | INTRAVENOUS | Status: DC

## 2019-02-16 MED ORDER — SODIUM CHLORIDE 0.9 % IV SOLN
600.0000 mg/m2 | Freq: Once | INTRAVENOUS | Status: AC
  Administered 2019-02-16: 12:00:00 1064 mg via INTRAVENOUS
  Filled 2019-02-16: qty 27.98

## 2019-02-16 MED ORDER — SODIUM CHLORIDE 0.9% FLUSH
10.0000 mL | Freq: Once | INTRAVENOUS | Status: AC
  Administered 2019-02-16: 10 mL
  Filled 2019-02-16: qty 10

## 2019-02-16 NOTE — Patient Instructions (Signed)
[facility] °Discharge Instructions for Patients Receiving Chemotherapy ° °Today you received the following chemotherapy agents Gemzar ° °To help prevent nausea and vomiting after your treatment, we encourage you to take your nausea medication as directed. °  °If you develop nausea and vomiting that is not controlled by your nausea medication, call the clinic.  ° °BELOW ARE SYMPTOMS THAT SHOULD BE REPORTED IMMEDIATELY: °· *FEVER GREATER THAN 100.5 F °· *CHILLS WITH OR WITHOUT FEVER °· NAUSEA AND VOMITING THAT IS NOT CONTROLLED WITH YOUR NAUSEA MEDICATION °· *UNUSUAL SHORTNESS OF BREATH °· *UNUSUAL BRUISING OR BLEEDING °· TENDERNESS IN MOUTH AND THROAT WITH OR WITHOUT PRESENCE OF ULCERS °· *URINARY PROBLEMS °· *BOWEL PROBLEMS °· UNUSUAL RASH °Items with * indicate a potential emergency and should be followed up as soon as possible. ° °Feel free to call the clinic should you have any questions or concerns. The clinic phone number is [phone]. ° °Please show the CHEMO ALERT CARD at check-in to the Emergency Department and triage nurse. ° ° °

## 2019-02-16 NOTE — Progress Notes (Signed)
ANC = 1.2. Dr. [name] notified and received OK to treat.

## 2019-02-16 NOTE — Assessment & Plan Note (Signed)
She has persistent, malignant ascites despite treatment Currently, she is not symptomatic Observe only

## 2019-02-16 NOTE — Assessment & Plan Note (Signed)
Renal function is stable We will adjust the dose of her chemotherapy accordingly

## 2019-02-16 NOTE — Assessment & Plan Note (Signed)
She tolerated treatment well except for progressive pancytopenia I will adjust the dose of gemcitabine a little bit After the current treatment, I suspect she will need more than a week to recover from this cycle of therapy I will adjust her next cycle to start around July 1 She will need minimum 3 cycles of treatment before repeat imaging study

## 2019-02-16 NOTE — Progress Notes (Signed)
[place] OFFICE PROGRESS NOTE  Patient Care Team: Caryl Bis, MD as PCP - General (Unknown Physician Specialty)  ASSESSMENT & PLAN:  Ovarian cancer [facility]) She tolerated treatment well except for progressive pancytopenia I will adjust the dose of gemcitabine a little bit After the current treatment, I suspect she will need more than a week to recover from this cycle of therapy I will adjust her next cycle to start around July 1 She will need minimum 3 cycles of treatment before repeat imaging study  Pancytopenia, acquired ([place]) She has worsening pancytopenia due to treatment I will reduce the dose of chemo little bit We will proceed with treatment without delay She does not need transfusion support We discussed neutropenic precaution  Malignant ascites She has persistent, malignant ascites despite treatment Currently, she is not symptomatic Observe only  CKD (chronic kidney disease), stage III ([place]) Renal function is stable We will adjust the dose of her chemotherapy accordingly   No orders of the defined types were placed in this encounter.   INTERVAL HISTORY: Please see below for problem oriented charting. She returns for chemotherapy and follow-up She tolerated last cycle of therapy well Denies recent infection, fever or chills She denies discomfort from malignant ascites The patient denies any recent signs or symptoms of bleeding such as spontaneous epistaxis, hematuria or hematochezia. Denies recent nausea  SUMMARY OF ONCOLOGIC HISTORY: Oncology History Overview Note  MMR normal NTRK normal   Ovarian cancer ([place])  08/25/2018 Initial Diagnosis   Initially presented with nausea, vomiting and abdominal distension   08/25/2018 Imaging   CT scan demonstrated right pleural thickening and a cardiophrenic lymph node measuring 1.5 cm.  The liver demonstrated mild nodularity and scalloping of the right lobe.  There were 2 less than 1 cm lesions seen  within the liver.  There was large volume ascites and a 13 cm upper abdominal left upper quadrant omental cake visualized.  There was extensive peritoneal thickening along the abdomen and pelvic peritoneum.  There is a 1.5 cm aortocaval lymph node identified.  No pelvic adenopathy was identified.  In the pelvis the left adnexa revealed a 4.6 x 3.2 cm ill-defined solid and cystic structure.  No other large dominant pelvic masses identified.  A moderate hiatal hernia was seen and a small umbilical hernia containing fat was seen.   08/29/2018 Tumor Marker   Patient's tumor was tested for the following markers: CA-125. Results of the tumor marker test revealed 1060   09/15/2018 Cancer Staging   Staging form: Ovary, Fallopian Tube, and Primary Peritoneal Carcinoma, AJCC 8th Edition - Clinical: Stage IV (cT3c, cN1, cM1) - Signed by Heath Lark, MD on 09/15/2018   09/15/2018 Tumor Marker   Patient's tumor was tested for the following markers: CA-125. Results of the tumor marker test revealed 1491   09/25/2018 Pathology Results   Omentum, biopsy - HIGH GRADE CARCINOMA, CONSISTENT WITH PAPILLARY SEROUS CARCINOMA. - SEE COMMENT. Microscopic Comment The malignant cells are positive for cytokeratin 7, estrogen receptor, p53, pax-8, and wt-1. They are negative for cytokeratin 20 and CDX-2. The histology, in conjunction with this immunohistochemical profile, supports the above diagnosis. Dr. Vicente Males has reviewed the case and concurs with this interpretation.    09/25/2018 Procedure   Successful 8 French right internal jugular vein power port placement with its tip at the SVC/RA junction.    09/26/2018 Imaging   1. Examination is positive for nodal metastasis within the anterior and posterior mediastinum as well as  right CP angle. 2. There is a small amount of right pleural fluid and pleural soft tissue nodularity which may reflect pleural involvement by tumor. 3. Large volume of ascites with evidence of  peritoneal carcinomatosis with omental caking. 4. Aortic Atherosclerosis (ICD10-I70.0) and Emphysema (ICD10-J43.9). 5. Multi vessel coronary artery atherosclerotic calcifications including left main disease    Genetic Testing   Patient has genetic testing done for MMR. Results revealed MMR - normal.   09/29/2018 -  Chemotherapy   The patient had carboplatin and taxol   09/29/2018 -  Chemotherapy   The patient had carboplatin and taxol for chemo   10/20/2018 Tumor Marker   Patient's tumor was tested for the following markers: CA-125. Results of the tumor marker test revealed 2146   10/25/2018 Procedure   Successful ultrasound-guided therapeutic paracentesis yielding 5 liters of peritoneal fluid.    11/14/2018 Procedure   Successful ultrasound-guided paracentesis yielding 2.2 liters of peritoneal fluid.   11/30/2018 Imaging   IMPRESSION: 1. Significant decrease in peritoneal carcinomatosis since previous study. Ascites has mildly increased. 2. Stable left adnexal mass. 3. Stable mild mediastinal lymphadenopathy. 4. No new or progressive metastatic disease identified. 5. Small to moderate hiatal hernia. Small paraumbilical and right inguinal hernias. 6. Colonic diverticulosis, without radiographic evidence of diverticulitis.   12/01/2018 Tumor Marker   Patient's tumor was tested for the following markers: CA-125. Results of the tumor marker test revealed 527   12/25/2018 Tumor Marker   Patient's tumor was tested for the following markers: CA-125. Results of the tumor marker test revealed 561   12/27/2018 Procedure   Successful ultrasound-guided paracentesis yielding 3.3 L of peritoneal fluid.   01/15/2019 Tumor Marker   Patient's tumor was tested for the following markers: CA-125. Results of the tumor marker test revealed 555   01/31/2019 Procedure   Successful ultrasound-guided paracentesis yielding 3.6 L of peritoneal fluid.   01/31/2019 Imaging   CT CHEST IMPRESSION  1.  Response to therapy within the chest. Stable and decreased size of thoracic nodes as detailed above. 2. Status post at least partial right lower lobectomy. Minimal right upper lobe nodularity is unchanged. 3. Similar tiny right pleural effusion. Necrotic node versus loculated pleural fluid about the inferior medial right chest, similar. 4. Aortic atherosclerosis (ICD10-I70.0), coronary artery atherosclerosis and emphysema (ICD10-J43.9).  CT ABDOMEN AND PELVIS IMPRESSION  1. Similar omental/peritoneal metastasis, with decrease in small volume anterior loculated ascites. No obstruction or other acute complication. 2. Decreased size of left ovarian/adnexal mass. 3. Similar and decreased size of low-density left pelvic sidewall lesions. The left common iliac lesion is suspicious for a lymph node. The external iliac lesion may represent a postoperative seroma, given fluid density. 4. Uterine fibroids. 5. Probable right nephrolithiasis. 6. Probable chronic calcific pancreatitis, similar.   02/09/2019 -  Chemotherapy   The patient had carboplatin and gemzar for chemotherapy treatment.    02/09/2019 Tumor Marker   Patient's tumor was tested for the following markers: CA-125. Results of the tumor marker test revealed 646     REVIEW OF SYSTEMS:   Constitutional: Denies fevers, chills or abnormal weight loss Eyes: Denies blurriness of vision Ears, nose, mouth, throat, and face: Denies mucositis or sore throat Respiratory: Denies cough, dyspnea or wheezes Cardiovascular: Denies palpitation, chest discomfort or lower extremity swelling Gastrointestinal:  Denies nausea, heartburn or change in bowel habits Skin: Denies abnormal skin rashes Lymphatics: Denies new lymphadenopathy or easy bruising Neurological:Denies numbness, tingling or new weaknesses Behavioral/Psych: Mood is stable, no new changes  All other systems were reviewed with the patient and are negative.  I have reviewed the past  medical history, past surgical history, social history and family history with the patient and they are unchanged from previous note.  ALLERGIES:  has No Known Allergies.  MEDICATIONS:  Current Outpatient Medications  Medication Sig Dispense Refill  . allopurinol (ZYLOPRIM) 300 MG tablet Take 300 mg by mouth daily.    Marland Kitchen aspirin EC 81 MG tablet Take 81 mg by mouth daily.    Marland Kitchen atorvastatin (LIPITOR) 80 MG tablet Take 80 mg by mouth daily.    . benazepril (LOTENSIN) 20 MG tablet Take 20 mg by mouth daily.    . Cholecalciferol (VITAMIN D3 PO) Take 1,000 Units by mouth daily.    . cloNIDine (CATAPRES) 0.2 MG tablet Take 0.2 mg by mouth 2 (two) times daily.    Marland Kitchen diltiazem (CARDIZEM CD) 300 MG 24 hr capsule Take 300 mg by mouth daily.    Marland Kitchen levothyroxine (SYNTHROID, LEVOTHROID) 100 MCG tablet Take 100 mcg by mouth daily before breakfast.    . lidocaine-prilocaine (EMLA) cream Apply to affected area once 30 g 3  . metFORMIN (GLUCOPHAGE-XR) 500 MG 24 hr tablet Take 2,000 mg by mouth daily with breakfast.    . ondansetron (ZOFRAN) 8 MG tablet Take 1 tablet (8 mg total) by mouth every 8 (eight) hours as needed for refractory nausea / vomiting. Start on day 3 after chemo. 30 tablet 1  . prochlorperazine (COMPAZINE) 10 MG tablet Take 1 tablet (10 mg total) by mouth every 6 (six) hours as needed (Nausea or vomiting). 30 tablet 1   No current facility-administered medications for this visit.    Facility-Administered Medications Ordered in Other Visits  Medication Dose Route Frequency Provider Last Rate Last Dose  . gemcitabine (GEMZAR) 1,064 mg in sodium chloride 0.9 % 250 mL chemo infusion  600 mg/m2 (Treatment Plan Recorded) Intravenous Once [name], Ersel Wadleigh, MD      . heparin lock flush 100 unit/mL  500 Units Intracatheter Once PRN [name], Martavia Tye, MD      . sodium chloride flush (NS) 0.9 % injection 10 mL  10 mL Intracatheter PRN [name], Berdell Hostetler, MD        PHYSICAL EXAMINATION: ECOG PERFORMANCE STATUS: 1 -  Symptomatic but completely ambulatory  Vitals:   02/16/19 0951  BP: 129/74  Pulse: 86  Resp: 18  Temp: 98 F (36.7 C)  SpO2: 100%   Filed Weights   02/16/19 0951  Weight: 157 lb (71.2 kg)    GENERAL:alert, no distress and comfortable SKIN: skin color, texture, turgor are normal, no rashes or significant lesions EYES: normal, Conjunctiva are pink and non-injected, sclera clear OROPHARYNX:no exudate, no erythema and lips, buccal mucosa, and tongue normal  NECK: supple, thyroid normal size, non-tender, without nodularity LYMPH:  no palpable lymphadenopathy in the cervical, axillary or inguinal LUNGS: clear to auscultation and percussion with normal breathing effort HEART: regular rate & rhythm and no murmurs and no lower extremity edema ABDOMEN:abdomen soft, non-tender and normal bowel sounds.  She has persistent ascites on exam Musculoskeletal:no cyanosis of digits and no clubbing  NEURO: alert & oriented x 3 with fluent speech, no focal motor/sensory deficits  LABORATORY DATA:  I have reviewed the data as listed    Component Value Date/Time   NA 141 02/16/2019 0929   K 4.1 02/16/2019 0929   CL 110 02/16/2019 0929   CO2 21 (L) 02/16/2019 0929   GLUCOSE 100 (H) 02/16/2019 7062  BUN 12 02/16/2019 0929   CREATININE 1.01 (H) 02/16/2019 0929   CALCIUM 8.6 (L) 02/16/2019 0929   PROT 5.9 (L) 02/16/2019 0929   ALBUMIN 3.1 (L) 02/16/2019 0929   AST 25 02/16/2019 0929   ALT 21 02/16/2019 0929   ALKPHOS 102 02/16/2019 0929   BILITOT <0.2 (L) 02/16/2019 0929   GFRNONAA 57 (L) 02/16/2019 0929   GFRAA >60 02/16/2019 0929    No results found for: SPEP, UPEP  Lab Results  Component Value Date   WBC 2.3 (L) 02/16/2019   NEUTROABS 1.2 (L) 02/16/2019   HGB 9.1 (L) 02/16/2019   HCT 28.5 (L) 02/16/2019   MCV 92.8 02/16/2019   PLT 124 (L) 02/16/2019      Chemistry      Component Value Date/Time   NA 141 02/16/2019 0929   K 4.1 02/16/2019 0929   CL 110 02/16/2019 0929    CO2 21 (L) 02/16/2019 0929   BUN 12 02/16/2019 0929   CREATININE 1.01 (H) 02/16/2019 0929      Component Value Date/Time   CALCIUM 8.6 (L) 02/16/2019 0929   ALKPHOS 102 02/16/2019 0929   AST 25 02/16/2019 0929   ALT 21 02/16/2019 0929   BILITOT <0.2 (L) 02/16/2019 0929       RADIOGRAPHIC STUDIES: I have personally reviewed the radiological images as listed and agreed with the findings in the report. Ct Chest W Contrast  Result Date: 01/31/2019 CLINICAL DATA:  Neoplasm of ovary. 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 Post primary therapy. Evaluate treatment response. Known malignant ascites. Remote history of lung cancer. EXAM: CT CHEST, ABDOMEN, AND PELVIS WITH CONTRAST TECHNIQUE: Multidetector CT imaging of the chest, abdomen and pelvis was performed following the standard protocol during bolus administration of intravenous contrast. CONTRAST:  145m OMNIPAQUE IOHEXOL 300 MG/ML  SOLN COMPARISON:  11/30/2018 FINDINGS: CT CHEST FINDINGS Cardiovascular: Advanced aortic and branch vessel atherosclerosis. Normal heart size, without pericardial effusion. Multivessel coronary artery atherosclerosis. No central pulmonary embolism, on this non-dedicated study. Mediastinum/Nodes: Right thyroidectomy. No supraclavicular adenopathy. AP window index node measures 1.1 cm on image 20/3, similar. Low-density structure in the right posterior chest, including at 3.5 x 2.8 cm on image 28/3. Felt to be similar to 3.7 x 2.8 cm on the prior. Index prevascular node measures 1.3 cm on image 17/3 versus 1.4 cm on the prior. A more anterior prevascular node measures 1.2 cm on image 19/3 versus 1.3 cm on the prior (when remeasured). Right cardiophrenic angle node measures 11 mm on image 41/3 versus 12 mm on the prior. Lungs/Pleura: Trace right pleural fluid is similar. Moderate centrilobular emphysema. At least partial right lower lobectomy. Subpleural mild right upper lobe nodularity is similar,  including at 5 mm on image 53/5. Musculoskeletal: Posterior right sixth rib defect is likely postsurgical. Presumed bone island within the T6 vertebral body CT ABDOMEN PELVIS FINDINGS Hepatobiliary: Too small to characterize right hepatic low-density lesions are less distinct today. No dominant liver lesion. Normal gallbladder, without biliary ductal dilatation. Pancreas: Pancreatic parenchymal calcifications within the head are similar on 62/3. No duct dilatation or acute inflammation. Spleen: Normal in size, without focal abnormality. Adrenals/Urinary Tract: Normal adrenal glands. Mild renal cortical thinning bilaterally. An interpolar left renal 4.6 cm cyst. Bilateral too small to characterize renal lesions. Bilateral renal vascular calcifications. Probable 4 mm upper pole right renal collecting system calculus. No hydronephrosis. Normal urinary bladder. Stomach/Bowel: Tiny hiatal hernia. Normal remainder of the stomach. Scattered colonic diverticula. Normal terminal ileum. Normal small bowel  caliber. Vascular/Lymphatic: Aortic and branch vessel atherosclerosis. No abdominal adenopathy. Left external iliac low-density structure of 1.1 cm on image 99/3 is unchanged. A 1.2 cm left common iliac soft tissue density is favored to represent an enlarged lymph node on image 91/3. 1.3 cm on the prior and 1.2 cm on 08/25/2018 (when remeasured). Reproductive: Presumed uterine fibroids, including at 3.2 cm. Soft tissue fullness in the region of the left ovary/adnexa. On the order of 2.8 x 3.5 cm on image 98/3. Compare 3.2 x 4.4 cm on the prior. Other: Primarily anterior, presumably loculated abdominal ascites is decreased compared to 11/30/2018. Omental soft tissue thickening, including at 2.2 cm on image 53/3. More inferiorly, on the order of 1.7 cm on image 62/3. Felt to be grossly similar to on the prior. Musculoskeletal: Vague sclerotic lesion within the L3 vertebral body is similar. Degenerative sclerosis within the L5  vertebral body. IMPRESSION: CT CHEST IMPRESSION 1. Response to therapy within the chest. Stable and decreased size of thoracic nodes as detailed above. 2. Status post at least partial right lower lobectomy. Minimal right upper lobe nodularity is unchanged. 3. Similar tiny right pleural effusion. Necrotic node versus loculated pleural fluid about the inferior medial right chest, similar. 4. Aortic atherosclerosis (ICD10-I70.0), coronary artery atherosclerosis and emphysema (ICD10-J43.9). CT ABDOMEN AND PELVIS IMPRESSION 1. Similar omental/peritoneal metastasis, with decrease in small volume anterior loculated ascites. No obstruction or other acute complication. 2. Decreased size of left ovarian/adnexal mass. 3. Similar and decreased size of low-density left pelvic sidewall lesions. The left common iliac lesion is suspicious for a lymph node. The external iliac lesion may represent a postoperative seroma, given fluid density. 4. Uterine fibroids. 5. Probable right nephrolithiasis. 6. Probable chronic calcific pancreatitis, similar. Electronically Signed   By: Abigail Miyamoto M.D.   On: 01/31/2019 16:11   Ct Abdomen Pelvis W Contrast  Result Date: 01/31/2019 CLINICAL DATA:  Neoplasm of ovary. 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 Post primary therapy. Evaluate treatment response. Known malignant ascites. Remote history of lung cancer. EXAM: CT CHEST, ABDOMEN, AND PELVIS WITH CONTRAST TECHNIQUE: Multidetector CT imaging of the chest, abdomen and pelvis was performed following the standard protocol during bolus administration of intravenous contrast. CONTRAST:  162m OMNIPAQUE IOHEXOL 300 MG/ML  SOLN COMPARISON:  11/30/2018 FINDINGS: CT CHEST FINDINGS Cardiovascular: Advanced aortic and branch vessel atherosclerosis. Normal heart size, without pericardial effusion. Multivessel coronary artery atherosclerosis. No central pulmonary embolism, on this non-dedicated study. Mediastinum/Nodes: Right  thyroidectomy. No supraclavicular adenopathy. AP window index node measures 1.1 cm on image 20/3, similar. Low-density structure in the right posterior chest, including at 3.5 x 2.8 cm on image 28/3. Felt to be similar to 3.7 x 2.8 cm on the prior. Index prevascular node measures 1.3 cm on image 17/3 versus 1.4 cm on the prior. A more anterior prevascular node measures 1.2 cm on image 19/3 versus 1.3 cm on the prior (when remeasured). Right cardiophrenic angle node measures 11 mm on image 41/3 versus 12 mm on the prior. Lungs/Pleura: Trace right pleural fluid is similar. Moderate centrilobular emphysema. At least partial right lower lobectomy. Subpleural mild right upper lobe nodularity is similar, including at 5 mm on image 53/5. Musculoskeletal: Posterior right sixth rib defect is likely postsurgical. Presumed bone island within the T6 vertebral body CT ABDOMEN PELVIS FINDINGS Hepatobiliary: Too small to characterize right hepatic low-density lesions are less distinct today. No dominant liver lesion. Normal gallbladder, without biliary ductal dilatation. Pancreas: Pancreatic parenchymal calcifications within the head are similar on  62/3. No duct dilatation or acute inflammation. Spleen: Normal in size, without focal abnormality. Adrenals/Urinary Tract: Normal adrenal glands. Mild renal cortical thinning bilaterally. An interpolar left renal 4.6 cm cyst. Bilateral too small to characterize renal lesions. Bilateral renal vascular calcifications. Probable 4 mm upper pole right renal collecting system calculus. No hydronephrosis. Normal urinary bladder. Stomach/Bowel: Tiny hiatal hernia. Normal remainder of the stomach. Scattered colonic diverticula. Normal terminal ileum. Normal small bowel caliber. Vascular/Lymphatic: Aortic and branch vessel atherosclerosis. No abdominal adenopathy. Left external iliac low-density structure of 1.1 cm on image 99/3 is unchanged. A 1.2 cm left common iliac soft tissue density is  favored to represent an enlarged lymph node on image 91/3. 1.3 cm on the prior and 1.2 cm on 08/25/2018 (when remeasured). Reproductive: Presumed uterine fibroids, including at 3.2 cm. Soft tissue fullness in the region of the left ovary/adnexa. On the order of 2.8 x 3.5 cm on image 98/3. Compare 3.2 x 4.4 cm on the prior. Other: Primarily anterior, presumably loculated abdominal ascites is decreased compared to 11/30/2018. Omental soft tissue thickening, including at 2.2 cm on image 53/3. More inferiorly, on the order of 1.7 cm on image 62/3. Felt to be grossly similar to on the prior. Musculoskeletal: Vague sclerotic lesion within the L3 vertebral body is similar. Degenerative sclerosis within the L5 vertebral body. IMPRESSION: CT CHEST IMPRESSION 1. Response to therapy within the chest. Stable and decreased size of thoracic nodes as detailed above. 2. Status post at least partial right lower lobectomy. Minimal right upper lobe nodularity is unchanged. 3. Similar tiny right pleural effusion. Necrotic node versus loculated pleural fluid about the inferior medial right chest, similar. 4. Aortic atherosclerosis (ICD10-I70.0), coronary artery atherosclerosis and emphysema (ICD10-J43.9). CT ABDOMEN AND PELVIS IMPRESSION 1. Similar omental/peritoneal metastasis, with decrease in small volume anterior loculated ascites. No obstruction or other acute complication. 2. Decreased size of left ovarian/adnexal mass. 3. Similar and decreased size of low-density left pelvic sidewall lesions. The left common iliac lesion is suspicious for a lymph node. The external iliac lesion may represent a postoperative seroma, given fluid density. 4. Uterine fibroids. 5. Probable right nephrolithiasis. 6. Probable chronic calcific pancreatitis, similar. Electronically Signed   By: Abigail Miyamoto M.D.   On: 01/31/2019 16:11   Ir Paracentesis  Result Date: 01/31/2019 INDICATION: Patient with history of ovarian cancer and recurrent malignant  ascites. Request is made for therapeutic paracentesis with a max of 5 L. EXAM: ULTRASOUND GUIDED THERAPEUTIC PARACENTESIS MEDICATIONS: 10 mL 1% lidocaine COMPLICATIONS: None immediate. PROCEDURE: Informed written consent was obtained from the patient after a discussion of the risks, benefits and alternatives to treatment. A timeout was performed prior to the initiation of the procedure. Initial ultrasound scanning demonstrates a large amount of ascites within the left lower abdominal quadrant. The left lower abdomen was prepped and draped in the usual sterile fashion. 1% lidocaine was used for local anesthesia. Following this, a 19 gauge, 7-cm, Yueh catheter was introduced. An ultrasound image was saved for documentation purposes. The paracentesis was performed. The catheter was removed and a dressing was applied. The patient tolerated the procedure well without immediate post procedural complication. FINDINGS: A total of approximately 3.6 L of clear yellow fluid was removed. IMPRESSION: Successful ultrasound-guided paracentesis yielding 3.6 L of peritoneal fluid. Read by: Earley Abide, PA-C Electronically Signed   By: Markus Daft M.D.   On: 01/31/2019 15:00    All questions were answered. The patient knows to call the clinic with any problems, questions or  concerns. No barriers to learning was detected.  I spent 25 minutes counseling the patient face to face. The total time spent in the appointment was 40 minutes and more than 50% was on counseling and review of test results  Heath Lark, MD 02/16/2019 11:17 AM

## 2019-02-16 NOTE — Assessment & Plan Note (Signed)
She has worsening pancytopenia due to treatment I will reduce the dose of chemo little bit We will proceed with treatment without delay She does not need transfusion support We discussed neutropenic precaution

## 2019-02-19 ENCOUNTER — Telehealth: Payer: Self-pay | Admitting: Hematology and Oncology

## 2019-02-19 NOTE — Telephone Encounter (Signed)
I could not reach the patient will mail schedule

## 2019-03-06 DIAGNOSIS — E1165 Type 2 diabetes mellitus with hyperglycemia: Secondary | ICD-10-CM | POA: Diagnosis not present

## 2019-03-06 DIAGNOSIS — I10 Essential (primary) hypertension: Secondary | ICD-10-CM | POA: Diagnosis not present

## 2019-03-06 DIAGNOSIS — E782 Mixed hyperlipidemia: Secondary | ICD-10-CM | POA: Diagnosis not present

## 2019-03-07 ENCOUNTER — Other Ambulatory Visit: Payer: Self-pay

## 2019-03-07 ENCOUNTER — Inpatient Hospital Stay (HOSPITAL_BASED_OUTPATIENT_CLINIC_OR_DEPARTMENT_OTHER): Payer: Medicare HMO | Admitting: Hematology and Oncology

## 2019-03-07 ENCOUNTER — Telehealth: Payer: Self-pay

## 2019-03-07 ENCOUNTER — Other Ambulatory Visit: Payer: Self-pay | Admitting: Hematology and Oncology

## 2019-03-07 ENCOUNTER — Ambulatory Visit (HOSPITAL_COMMUNITY)
Admission: RE | Admit: 2019-03-07 | Discharge: 2019-03-07 | Disposition: A | Discharge status: Home / Self Care | Payer: Medicare HMO | Source: Ambulatory Visit | Attending: Hematology and Oncology | Admitting: Hematology and Oncology

## 2019-03-07 ENCOUNTER — Inpatient Hospital Stay: Payer: Medicare HMO

## 2019-03-07 ENCOUNTER — Encounter: Payer: Self-pay | Admitting: Hematology and Oncology

## 2019-03-07 ENCOUNTER — Inpatient Hospital Stay: Payer: Medicare HMO | Attending: Gynecologic Oncology

## 2019-03-07 VITALS — BP 128/53 | HR 91 | Temp 98.2°F | Resp 18 | Ht 65.0 in | Wt 163.4 lb

## 2019-03-07 DIAGNOSIS — C569 Malignant neoplasm of unspecified ovary: Secondary | ICD-10-CM

## 2019-03-07 DIAGNOSIS — Z5111 Encounter for antineoplastic chemotherapy: Secondary | ICD-10-CM | POA: Diagnosis present

## 2019-03-07 DIAGNOSIS — C786 Secondary malignant neoplasm of retroperitoneum and peritoneum: Secondary | ICD-10-CM

## 2019-03-07 DIAGNOSIS — D61818 Other pancytopenia: Secondary | ICD-10-CM

## 2019-03-07 DIAGNOSIS — T451X5S Adverse effect of antineoplastic and immunosuppressive drugs, sequela: Secondary | ICD-10-CM

## 2019-03-07 DIAGNOSIS — J439 Emphysema, unspecified: Secondary | ICD-10-CM | POA: Diagnosis not present

## 2019-03-07 DIAGNOSIS — Z7984 Long term (current) use of oral hypoglycemic drugs: Secondary | ICD-10-CM | POA: Diagnosis not present

## 2019-03-07 DIAGNOSIS — Z79899 Other long term (current) drug therapy: Secondary | ICD-10-CM

## 2019-03-07 DIAGNOSIS — R180 Malignant ascites: Secondary | ICD-10-CM

## 2019-03-07 DIAGNOSIS — Z7982 Long term (current) use of aspirin: Secondary | ICD-10-CM | POA: Diagnosis not present

## 2019-03-07 DIAGNOSIS — N183 Chronic kidney disease, stage 3 (moderate): Secondary | ICD-10-CM | POA: Diagnosis not present

## 2019-03-07 DIAGNOSIS — I700 Atherosclerosis of aorta: Secondary | ICD-10-CM | POA: Insufficient documentation

## 2019-03-07 DIAGNOSIS — J90 Pleural effusion, not elsewhere classified: Secondary | ICD-10-CM | POA: Diagnosis not present

## 2019-03-07 LAB — CBC WITH DIFFERENTIAL (CANCER CENTER ONLY)
Abs Immature Granulocytes: 0.01 10*3/uL (ref 0.00–0.07)
Basophils Absolute: 0.1 10*3/uL (ref 0.0–0.1)
Basophils Relative: 1 %
Eosinophils Absolute: 0.1 10*3/uL (ref 0.0–0.5)
Eosinophils Relative: 2 %
HCT: 25.9 % — ABNORMAL LOW (ref 36.0–46.0)
Hemoglobin: 8.3 g/dL — ABNORMAL LOW (ref 12.0–15.0)
Immature Granulocytes: 0 %
Lymphocytes Relative: 25 %
Lymphs Abs: 1.1 10*3/uL (ref 0.7–4.0)
MCH: 29.6 pg (ref 26.0–34.0)
MCHC: 32 g/dL (ref 30.0–36.0)
MCV: 92.5 fL (ref 80.0–100.0)
Monocytes Absolute: 0.4 10*3/uL (ref 0.1–1.0)
Monocytes Relative: 10 %
Neutro Abs: 2.7 10*3/uL (ref 1.7–7.7)
Neutrophils Relative %: 62 %
Platelet Count: 484 10*3/uL — ABNORMAL HIGH (ref 150–400)
RBC: 2.8 MIL/uL — ABNORMAL LOW (ref 3.87–5.11)
RDW: 14.5 % (ref 11.5–15.5)
WBC Count: 4.3 10*3/uL (ref 4.0–10.5)
nRBC: 0 % (ref 0.0–0.2)

## 2019-03-07 LAB — CMP (CANCER CENTER ONLY)
ALT: 16 U/L (ref 0–44)
AST: 29 U/L (ref 15–41)
Albumin: 3.1 g/dL — ABNORMAL LOW (ref 3.5–5.0)
Alkaline Phosphatase: 111 U/L (ref 38–126)
Anion gap: 10 (ref 5–15)
BUN: 18 mg/dL (ref 8–23)
CO2: 19 mmol/L — ABNORMAL LOW (ref 22–32)
Calcium: 8.5 mg/dL — ABNORMAL LOW (ref 8.9–10.3)
Chloride: 110 mmol/L (ref 98–111)
Creatinine: 1.59 mg/dL — ABNORMAL HIGH (ref 0.44–1.00)
GFR, Est AFR Am: 38 mL/min — ABNORMAL LOW (ref >60)
GFR, Estimated: 33 mL/min — ABNORMAL LOW (ref >60)
Glucose, Bld: 85 mg/dL (ref 70–99)
Potassium: 4.6 mmol/L (ref 3.5–5.1)
Sodium: 139 mmol/L (ref 135–145)
Total Bilirubin: 0.2 mg/dL — ABNORMAL LOW (ref 0.3–1.2)
Total Protein: 6 g/dL — ABNORMAL LOW (ref 6.5–8.1)

## 2019-03-07 LAB — SAMPLE TO BLOOD BANK

## 2019-03-07 MED ORDER — SODIUM CHLORIDE 0.9% FLUSH
10.0000 mL | Freq: Once | INTRAVENOUS | Status: AC
  Administered 2019-03-07: 10 mL
  Filled 2019-03-07: qty 10

## 2019-03-07 MED ORDER — SODIUM CHLORIDE 0.9% FLUSH
10.0000 mL | INTRAVENOUS | Status: DC | PRN
  Administered 2019-03-07: 10 mL
  Filled 2019-03-07: qty 10

## 2019-03-07 MED ORDER — FAMOTIDINE IN NACL 20-0.9 MG/50ML-% IV SOLN
20.0000 mg | Freq: Once | INTRAVENOUS | Status: AC
  Administered 2019-03-07: 20 mg via INTRAVENOUS

## 2019-03-07 MED ORDER — FAMOTIDINE IN NACL 20-0.9 MG/50ML-% IV SOLN
INTRAVENOUS | Status: AC
  Filled 2019-03-07: qty 50

## 2019-03-07 MED ORDER — HEPARIN SOD (PORK) LOCK FLUSH 100 UNIT/ML IV SOLN
500.0000 [IU] | Freq: Once | INTRAVENOUS | Status: AC | PRN
  Administered 2019-03-07: 500 [IU]
  Filled 2019-03-07: qty 5

## 2019-03-07 MED ORDER — SODIUM CHLORIDE 0.9 % IV SOLN
Freq: Once | INTRAVENOUS | Status: AC
  Administered 2019-03-07: 11:00:00 via INTRAVENOUS
  Filled 2019-03-07: qty 5

## 2019-03-07 MED ORDER — PALONOSETRON HCL INJECTION 0.25 MG/5ML
0.2500 mg | Freq: Once | INTRAVENOUS | Status: AC
  Administered 2019-03-07: 0.25 mg via INTRAVENOUS

## 2019-03-07 MED ORDER — DIPHENHYDRAMINE HCL 50 MG/ML IJ SOLN
25.0000 mg | Freq: Once | INTRAMUSCULAR | Status: AC
  Administered 2019-03-07: 25 mg via INTRAVENOUS

## 2019-03-07 MED ORDER — DIPHENHYDRAMINE HCL 50 MG/ML IJ SOLN
INTRAMUSCULAR | Status: AC
  Filled 2019-03-07: qty 1

## 2019-03-07 MED ORDER — SODIUM CHLORIDE 0.9 % IV SOLN
Freq: Once | INTRAVENOUS | Status: AC
  Administered 2019-03-07: 11:00:00 via INTRAVENOUS
  Filled 2019-03-07: qty 250

## 2019-03-07 MED ORDER — PALONOSETRON HCL INJECTION 0.25 MG/5ML
INTRAVENOUS | Status: AC
  Filled 2019-03-07: qty 5

## 2019-03-07 MED ORDER — SODIUM CHLORIDE 0.9 % IV SOLN
600.0000 mg/m2 | Freq: Once | INTRAVENOUS | Status: AC
  Administered 2019-03-07: 1064 mg via INTRAVENOUS
  Filled 2019-03-07: qty 27.98

## 2019-03-07 MED ORDER — SODIUM CHLORIDE 0.9 % IV SOLN
260.0000 mg | Freq: Once | INTRAVENOUS | Status: AC
  Administered 2019-03-07: 260 mg via INTRAVENOUS
  Filled 2019-03-07: qty 26

## 2019-03-07 MED ORDER — LIDOCAINE HCL 1 % IJ SOLN
INTRAMUSCULAR | Status: AC
  Filled 2019-03-07: qty 20

## 2019-03-07 NOTE — Patient Instructions (Signed)
Coronavirus (COVID-19) Are you at risk?  Are you at risk for the Coronavirus (COVID-19)?  To be considered HIGH RISK for Coronavirus (COVID-19), you have to meet the following criteria:  . Traveled to [place], [place], [place], [place] or [place]; or in the [place] to [place], [place], or [place]; and have fever, cough, and shortness of breath within the last 2 weeks of travel OR . Been in close contact with a person diagnosed with COVID-19 within the last 2 weeks and have fever, cough, and shortness of breath . IF YOU DO NOT MEET THESE CRITERIA, YOU ARE CONSIDERED LOW RISK FOR COVID-19.  What to do if you are HIGH RISK for COVID-19?  Marland Kitchen If you are having a medical emergency, call 911. . Seek medical care right away. Before you go to a doctor's office, urgent care or emergency department, call ahead and tell them about your recent travel, contact with someone diagnosed with COVID-19, and your symptoms. You should receive instructions from your physician's office regarding next steps of care.  . When you arrive at healthcare provider, tell the healthcare staff immediately you have returned from visiting [place], [place], [place], [place] or [place]; or traveled in the [place] to [place], [place], [place], or [place]; in the last two weeks or you have been in close contact with a person diagnosed with COVID-19 in the last 2 weeks.   . Tell the health care staff about your symptoms: fever, cough and shortness of breath. . After you have been seen by a medical provider, you will be either: o Tested for (COVID-19) and discharged home on quarantine except to seek medical care if symptoms worsen, and asked to  - Stay home and avoid contact with others until you get your results (4-5 days)  - Avoid travel on public transportation if possible (such as bus, train, or airplane) or o Sent to the Emergency Department by EMS for evaluation, COVID-19 testing, and possible  admission depending on your condition and test results.  What to do if you are LOW RISK for COVID-19?  Reduce your risk of any infection by using the same precautions used for avoiding the common cold or flu:  Marland Kitchen Wash your hands often with soap and warm water for at least 20 seconds.  If soap and water are not readily available, use an alcohol-based hand sanitizer with at least 60% alcohol.  . If coughing or sneezing, cover your mouth and nose by coughing or sneezing into the elbow areas of your shirt or coat, into a tissue or into your sleeve (not your hands). . Avoid shaking hands with others and consider head nods or verbal greetings only. . Avoid touching your eyes, nose, or mouth with unwashed hands.  . Avoid close contact with people who are sick. . Avoid places or events with large numbers of people in one location, like concerts or sporting events. . Carefully consider travel plans you have or are making. . If you are planning any travel outside or inside the [place], visit the CDC's Travelers' Health webpage for the latest health notices. . If you have some symptoms but not all symptoms, continue to monitor at home and seek medical attention if your symptoms worsen. . If you are having a medical emergency, call 911.   [place] / e-Visit: [email or web address]         [facility]: [place]  Urgent Care: [place] [facility]: [place] Discharge Instructions for Patients Receiving Chemotherapy  Today you received the following chemotherapy agents Gemzar and Carboplatin  To help prevent nausea and vomiting after your treatment, we encourage you to take your nausea medication as directed.    If you develop nausea and vomiting that is not controlled by your nausea medication, call the clinic.    BELOW ARE SYMPTOMS THAT SHOULD BE REPORTED IMMEDIATELY:  *FEVER GREATER THAN 100.5 F  *CHILLS WITH OR WITHOUT FEVER  NAUSEA AND VOMITING THAT IS NOT CONTROLLED WITH YOUR NAUSEA MEDICATION  *UNUSUAL SHORTNESS OF BREATH  *UNUSUAL BRUISING OR BLEEDING  TENDERNESS IN MOUTH AND THROAT WITH OR WITHOUT PRESENCE OF ULCERS  *URINARY PROBLEMS  *BOWEL PROBLEMS  UNUSUAL RASH Items with * indicate a potential emergency and should be followed up as soon as possible.  Feel free to call the clinic should you have any questions or concerns. The clinic phone number is [phone].  Please show the [place] at check-in to the Emergency Department and triage nurse.

## 2019-03-07 NOTE — Procedures (Addendum)
Ultrasound-guided therapeutic paracentesis performed yielding 5 liters (maximum ordered) of blood-tinged fluid. No immediate complications. EBL none. Ordering MD's nurse made aware of fluid color.

## 2019-03-07 NOTE — Assessment & Plan Note (Signed)
She tolerated treatment well except for mild worsening anemia She has gained some weight due to recurrent malignant ascites We will proceed with treatment without delay I have arranged for urgent paracentesis today after treatment I will see her again in approximately 10 days time for cycle 2, day 8

## 2019-03-07 NOTE — Assessment & Plan Note (Signed)
She has reduced breath sounds on the right most consistent with pleural effusion Due to lack of symptoms, I recommend observe only for now

## 2019-03-07 NOTE — Progress Notes (Signed)
[facility] OFFICE PROGRESS NOTE  Patient Care Team: Caryl Bis, MD as PCP - General (Unknown Physician Specialty)  ASSESSMENT & PLAN:  Ovarian cancer [facility]) She tolerated treatment well except for mild worsening anemia She has gained some weight due to recurrent malignant ascites We will proceed with treatment without delay I have arranged for urgent paracentesis today after treatment I will see her again in approximately 10 days time for cycle 2, day 8  Pancytopenia, acquired ([place]) She has pancytopenia due to treatment I will adjust the dose of treatment accordingly She does not need transfusion support today  Malignant ascites She has significant recurrent ascites I have arranged for paracentesis We will reassess next visit  Pleural effusion on right She has reduced breath sounds on the right most consistent with pleural effusion Due to lack of symptoms, I recommend observe only for now   Orders Placed This Encounter  Procedures  . US Paracentesis    Standing Status:   Future    Standing Expiration Date:   03/06/2020    Order Specific Question:   If therapeutic, is there a maximum amount of fluid to be removed?    Answer:   Yes    Comments:   5 liters    Order Specific Question:   What is the maximum amount of fluide to be removed?    Answer:   5 liters    Order Specific Question:   Are labs required for specimen collection?    Answer:   No    Order Specific Question:   Is Albumin medication needed?    Answer:   No    Order Specific Question:   Reason for Exam (SYMPTOM  OR DIAGNOSIS REQUIRED)    Answer:   malignant ascites    Order Specific Question:   Preferred imaging location?    Answer:   [facility]    INTERVAL HISTORY: Please see below for problem oriented charting. She returns for cycle 2 of treatment She has gained 6 pounds of weight since last time I saw her She noted progressive abdominal distention She denies cough, chest  pain or shortness of breath No recent nausea or changes in bowel habits She tolerated recent treatment well She denies worsening neuropathy The patient denies any recent signs or symptoms of bleeding such as spontaneous epistaxis, hematuria or hematochezia.   SUMMARY OF ONCOLOGIC HISTORY: Oncology History Overview Note  MMR normal NTRK normal   Ovarian cancer ([name])  08/25/2018 Initial Diagnosis   Initially presented with nausea, vomiting and abdominal distension   08/25/2018 Imaging   CT scan demonstrated right pleural thickening and a cardiophrenic lymph node measuring 1.5 cm.  The liver demonstrated mild nodularity and scalloping of the right lobe.  There were 2 less than 1 cm lesions seen within the liver.  There was large volume ascites and a 13 cm upper abdominal left upper quadrant omental cake visualized.  There was extensive peritoneal thickening along the abdomen and pelvic peritoneum.  There is a 1.5 cm aortocaval lymph node identified.  No pelvic adenopathy was identified.  In the pelvis the left adnexa revealed a 4.6 x 3.2 cm ill-defined solid and cystic structure.  No other large dominant pelvic masses identified.  A moderate hiatal hernia was seen and a small umbilical hernia containing fat was seen.   08/29/2018 Tumor Marker   Patient's tumor was tested for the following markers: CA-125. Results of the tumor marker test revealed 1060  09/15/2018 Cancer Staging   Staging form: Ovary, Fallopian Tube, and Primary Peritoneal Carcinoma, AJCC 8th Edition - Clinical: Stage IV (cT3c, cN1, cM1) - Signed by Heath Lark, MD on 09/15/2018   09/15/2018 Tumor Marker   Patient's tumor was tested for the following markers: CA-125. Results of the tumor marker test revealed 1491   09/25/2018 Pathology Results   Omentum, biopsy - HIGH GRADE CARCINOMA, CONSISTENT WITH PAPILLARY SEROUS CARCINOMA. - SEE COMMENT. Microscopic Comment The malignant cells are positive for cytokeratin 7,  estrogen receptor, p53, pax-8, and wt-1. They are negative for cytokeratin 20 and CDX-2. The histology, in conjunction with this immunohistochemical profile, supports the above diagnosis. Dr. Vicente Males has reviewed the case and concurs with this interpretation.    09/25/2018 Procedure   Successful 8 French right internal jugular vein power port placement with its tip at the SVC/RA junction.    09/26/2018 Imaging   1. Examination is positive for nodal metastasis within the anterior and posterior mediastinum as well as right CP angle. 2. There is a small amount of right pleural fluid and pleural soft tissue nodularity which may reflect pleural involvement by tumor. 3. Large volume of ascites with evidence of peritoneal carcinomatosis with omental caking. 4. Aortic Atherosclerosis (ICD10-I70.0) and Emphysema (ICD10-J43.9). 5. Multi vessel coronary artery atherosclerotic calcifications including left main disease    Genetic Testing   Patient has genetic testing done for MMR. Results revealed MMR - normal.   09/29/2018 -  Chemotherapy   The patient had carboplatin and taxol   09/29/2018 -  Chemotherapy   The patient had carboplatin and taxol for chemo   10/20/2018 Tumor Marker   Patient's tumor was tested for the following markers: CA-125. Results of the tumor marker test revealed 2146   10/25/2018 Procedure   Successful ultrasound-guided therapeutic paracentesis yielding 5 liters of peritoneal fluid.    11/14/2018 Procedure   Successful ultrasound-guided paracentesis yielding 2.2 liters of peritoneal fluid.   11/30/2018 Imaging   IMPRESSION: 1. Significant decrease in peritoneal carcinomatosis since previous study. Ascites has mildly increased. 2. Stable left adnexal mass. 3. Stable mild mediastinal lymphadenopathy. 4. No new or progressive metastatic disease identified. 5. Small to moderate hiatal hernia. Small paraumbilical and right inguinal hernias. 6. Colonic diverticulosis,  without radiographic evidence of diverticulitis.   12/01/2018 Tumor Marker   Patient's tumor was tested for the following markers: CA-125. Results of the tumor marker test revealed 527   12/25/2018 Tumor Marker   Patient's tumor was tested for the following markers: CA-125. Results of the tumor marker test revealed 561   12/27/2018 Procedure   Successful ultrasound-guided paracentesis yielding 3.3 L of peritoneal fluid.   01/15/2019 Tumor Marker   Patient's tumor was tested for the following markers: CA-125. Results of the tumor marker test revealed 555   01/31/2019 Procedure   Successful ultrasound-guided paracentesis yielding 3.6 L of peritoneal fluid.   01/31/2019 Imaging   CT CHEST IMPRESSION  1. Response to therapy within the chest. Stable and decreased size of thoracic nodes as detailed above. 2. Status post at least partial right lower lobectomy. Minimal right upper lobe nodularity is unchanged. 3. Similar tiny right pleural effusion. Necrotic node versus loculated pleural fluid about the inferior medial right chest, similar. 4. Aortic atherosclerosis (ICD10-I70.0), coronary artery atherosclerosis and emphysema (ICD10-J43.9).  CT ABDOMEN AND PELVIS IMPRESSION  1. Similar omental/peritoneal metastasis, with decrease in small volume anterior loculated ascites. No obstruction or other acute complication. 2. Decreased size of left ovarian/adnexal mass. 3.  Similar and decreased size of low-density left pelvic sidewall lesions. The left common iliac lesion is suspicious for a lymph node. The external iliac lesion may represent a postoperative seroma, given fluid density. 4. Uterine fibroids. 5. Probable right nephrolithiasis. 6. Probable chronic calcific pancreatitis, similar.   02/09/2019 -  Chemotherapy   The patient had carboplatin and gemzar for chemotherapy treatment.    02/09/2019 Tumor Marker   Patient's tumor was tested for the following markers: CA-125. Results of the  tumor marker test revealed 646     REVIEW OF SYSTEMS:   Constitutional: Denies fevers, chills or abnormal weight loss Eyes: Denies blurriness of vision Ears, nose, mouth, throat, and face: Denies mucositis or sore throat Respiratory: Denies cough, dyspnea or wheezes Cardiovascular: Denies palpitation, chest discomfort or lower extremity swelling Gastrointestinal:  Denies nausea, heartburn or change in bowel habits Skin: Denies abnormal skin rashes Lymphatics: Denies new lymphadenopathy or easy bruising Neurological:Denies numbness, tingling or new weaknesses Behavioral/Psych: Mood is stable, no new changes  All other systems were reviewed with the patient and are negative.  I have reviewed the past medical history, past surgical history, social history and family history with the patient and they are unchanged from previous note.  ALLERGIES:  has No Known Allergies.  MEDICATIONS:  Current Outpatient Medications  Medication Sig Dispense Refill  . allopurinol (ZYLOPRIM) 300 MG tablet Take 300 mg by mouth daily.    Marland Kitchen aspirin EC 81 MG tablet Take 81 mg by mouth daily.    Marland Kitchen atorvastatin (LIPITOR) 80 MG tablet Take 80 mg by mouth daily.    . benazepril (LOTENSIN) 20 MG tablet Take 20 mg by mouth daily.    . Cholecalciferol (VITAMIN D3 PO) Take 1,000 Units by mouth daily.    . cloNIDine (CATAPRES) 0.2 MG tablet Take 0.2 mg by mouth 2 (two) times daily.    Marland Kitchen diltiazem (CARDIZEM CD) 300 MG 24 hr capsule Take 300 mg by mouth daily.    Marland Kitchen levothyroxine (SYNTHROID, LEVOTHROID) 100 MCG tablet Take 100 mcg by mouth daily before breakfast.    . lidocaine-prilocaine (EMLA) cream Apply to affected area once 30 g 3  . metFORMIN (GLUCOPHAGE-XR) 500 MG 24 hr tablet Take 2,000 mg by mouth daily with breakfast.    . ondansetron (ZOFRAN) 8 MG tablet Take 1 tablet (8 mg total) by mouth every 8 (eight) hours as needed for refractory nausea / vomiting. Start on day 3 after chemo. 30 tablet 1  .  prochlorperazine (COMPAZINE) 10 MG tablet Take 1 tablet (10 mg total) by mouth every 6 (six) hours as needed (Nausea or vomiting). 30 tablet 1   No current facility-administered medications for this visit.     PHYSICAL EXAMINATION: ECOG PERFORMANCE STATUS: 1 - Symptomatic but completely ambulatory  Vitals:   03/07/19 0951  BP: (!) 128/53  Pulse: 91  Resp: 18  Temp: 98.2 F (36.8 C)  SpO2: 100%   Filed Weights   03/07/19 0951  Weight: 163 lb 6.4 oz (74.1 kg)    GENERAL:alert, no distress and comfortable SKIN: skin color, texture, turgor are normal, no rashes or significant lesions EYES: normal, Conjunctiva are pink and non-injected, sclera clear OROPHARYNX:no exudate, no erythema and lips, buccal mucosa, and tongue normal  NECK: supple, thyroid normal size, non-tender, without nodularity LYMPH:  no palpable lymphadenopathy in the cervical, axillary or inguinal LUNGS: She has reduced breath sounds on the right lung but with normal breathing effort HEART: regular rate & rhythm and no murmurs and  no lower extremity edema ABDOMEN:abdomen soft, non-tender and normal bowel sounds.  She has significant abdominal distention consistent with ascites Musculoskeletal:no cyanosis of digits and no clubbing  NEURO: alert & oriented x 3 with fluent speech, no focal motor/sensory deficits  LABORATORY DATA:  I have reviewed the data as listed    Component Value Date/Time   NA 141 02/16/2019 0929   K 4.1 02/16/2019 0929   CL 110 02/16/2019 0929   CO2 21 (L) 02/16/2019 0929   GLUCOSE 100 (H) 02/16/2019 0929   BUN 12 02/16/2019 0929   CREATININE 1.01 (H) 02/16/2019 0929   CALCIUM 8.6 (L) 02/16/2019 0929   PROT 5.9 (L) 02/16/2019 0929   ALBUMIN 3.1 (L) 02/16/2019 0929   AST 25 02/16/2019 0929   ALT 21 02/16/2019 0929   ALKPHOS 102 02/16/2019 0929   BILITOT <0.2 (L) 02/16/2019 0929   GFRNONAA 57 (L) 02/16/2019 0929   GFRAA >60 02/16/2019 0929    No results found for: SPEP, UPEP  Lab  Results  Component Value Date   WBC 4.3 03/07/2019   NEUTROABS 2.7 03/07/2019   HGB 8.3 (L) 03/07/2019   HCT 25.9 (L) 03/07/2019   MCV 92.5 03/07/2019   PLT 484 (H) 03/07/2019      Chemistry      Component Value Date/Time   NA 141 02/16/2019 0929   K 4.1 02/16/2019 0929   CL 110 02/16/2019 0929   CO2 21 (L) 02/16/2019 0929   BUN 12 02/16/2019 0929   CREATININE 1.01 (H) 02/16/2019 0929      Component Value Date/Time   CALCIUM 8.6 (L) 02/16/2019 0929   ALKPHOS 102 02/16/2019 0929   AST 25 02/16/2019 0929   ALT 21 02/16/2019 0929   BILITOT <0.2 (L) 02/16/2019 0929       All questions were answered. The patient knows to call the clinic with any problems, questions or concerns. No barriers to learning was detected.  I spent 25 minutes counseling the patient face to face. The total time spent in the appointment was 40 minutes and more than 50% was on counseling and review of test results  Heath Lark, MD 03/07/2019 10:20 AM

## 2019-03-07 NOTE — Progress Notes (Signed)
Okay to treat with creatinine of 1.59, per Dr. [name].

## 2019-03-07 NOTE — Telephone Encounter (Signed)
IR called. They removed 5 liters today with paracentesis and the fluid was blood tinged. Her fluid is normally yellow tinged. This is just a FYI.

## 2019-03-07 NOTE — Assessment & Plan Note (Signed)
She has pancytopenia due to treatment I will adjust the dose of treatment accordingly She does not need transfusion support today

## 2019-03-07 NOTE — Patient Instructions (Signed)

## 2019-03-07 NOTE — Assessment & Plan Note (Signed)
She has significant recurrent ascites I have arranged for paracentesis We will reassess next visit

## 2019-03-08 ENCOUNTER — Telehealth: Payer: Self-pay

## 2019-03-08 LAB — CA 125: Cancer Antigen (CA) 125: 1274 U/mL — ABNORMAL HIGH (ref 0.0–38.1)

## 2019-03-08 NOTE — Telephone Encounter (Signed)
Called and given below message. Appt scheduled for 8:15 am on Wednesday. She verbalized understanding.

## 2019-03-08 NOTE — Telephone Encounter (Signed)
-----   Message from Heath Lark, MD sent at 03/08/2019  9:12 AM EDT ----- Regarding: labs only next Wed Can you get her lab only next Wed morning to recheck her blood count in case she needs transfusion?

## 2019-03-14 ENCOUNTER — Other Ambulatory Visit: Payer: Self-pay

## 2019-03-14 ENCOUNTER — Other Ambulatory Visit: Payer: Self-pay | Admitting: Hematology and Oncology

## 2019-03-14 ENCOUNTER — Ambulatory Visit (HOSPITAL_COMMUNITY)
Admission: RE | Admit: 2019-03-14 | Discharge: 2019-03-14 | Disposition: A | Discharge status: Home / Self Care | Payer: Medicare HMO | Source: Ambulatory Visit | Attending: Hematology and Oncology | Admitting: Hematology and Oncology

## 2019-03-14 ENCOUNTER — Inpatient Hospital Stay: Payer: Medicare HMO

## 2019-03-14 ENCOUNTER — Telehealth: Payer: Self-pay

## 2019-03-14 DIAGNOSIS — C569 Malignant neoplasm of unspecified ovary: Secondary | ICD-10-CM

## 2019-03-14 DIAGNOSIS — R180 Malignant ascites: Secondary | ICD-10-CM

## 2019-03-14 DIAGNOSIS — D630 Anemia in neoplastic disease: Secondary | ICD-10-CM

## 2019-03-14 DIAGNOSIS — R188 Other ascites: Secondary | ICD-10-CM | POA: Diagnosis not present

## 2019-03-14 DIAGNOSIS — D61818 Other pancytopenia: Secondary | ICD-10-CM

## 2019-03-14 DIAGNOSIS — Z5111 Encounter for antineoplastic chemotherapy: Secondary | ICD-10-CM | POA: Diagnosis not present

## 2019-03-14 LAB — CMP (CANCER CENTER ONLY)
ALT: 18 U/L (ref 0–44)
AST: 30 U/L (ref 15–41)
Albumin: 2.7 g/dL — ABNORMAL LOW (ref 3.5–5.0)
Alkaline Phosphatase: 105 U/L (ref 38–126)
Anion gap: 8 (ref 5–15)
BUN: 25 mg/dL — ABNORMAL HIGH (ref 8–23)
CO2: 20 mmol/L — ABNORMAL LOW (ref 22–32)
Calcium: 8.4 mg/dL — ABNORMAL LOW (ref 8.9–10.3)
Chloride: 112 mmol/L — ABNORMAL HIGH (ref 98–111)
Creatinine: 1.41 mg/dL — ABNORMAL HIGH (ref 0.44–1.00)
GFR, Est AFR Am: 44 mL/min — ABNORMAL LOW (ref >60)
GFR, Estimated: 38 mL/min — ABNORMAL LOW (ref >60)
Glucose, Bld: 95 mg/dL (ref 70–99)
Potassium: 4.9 mmol/L (ref 3.5–5.1)
Sodium: 140 mmol/L (ref 135–145)
Total Bilirubin: 0.2 mg/dL — ABNORMAL LOW (ref 0.3–1.2)
Total Protein: 5.6 g/dL — ABNORMAL LOW (ref 6.5–8.1)

## 2019-03-14 LAB — CBC WITH DIFFERENTIAL (CANCER CENTER ONLY)
Abs Immature Granulocytes: 0.01 10*3/uL (ref 0.00–0.07)
Basophils Absolute: 0 10*3/uL (ref 0.0–0.1)
Basophils Relative: 1 %
Eosinophils Absolute: 0 10*3/uL (ref 0.0–0.5)
Eosinophils Relative: 2 %
HCT: 24.9 % — ABNORMAL LOW (ref 36.0–46.0)
Hemoglobin: 7.9 g/dL — ABNORMAL LOW (ref 12.0–15.0)
Immature Granulocytes: 0 %
Lymphocytes Relative: 33 %
Lymphs Abs: 0.9 10*3/uL (ref 0.7–4.0)
MCH: 28.9 pg (ref 26.0–34.0)
MCHC: 31.7 g/dL (ref 30.0–36.0)
MCV: 91.2 fL (ref 80.0–100.0)
Monocytes Absolute: 0.1 10*3/uL (ref 0.1–1.0)
Monocytes Relative: 4 %
Neutro Abs: 1.6 10*3/uL — ABNORMAL LOW (ref 1.7–7.7)
Neutrophils Relative %: 60 %
Platelet Count: 208 10*3/uL (ref 150–400)
RBC: 2.73 MIL/uL — ABNORMAL LOW (ref 3.87–5.11)
RDW: 14.5 % (ref 11.5–15.5)
WBC Count: 2.6 10*3/uL — ABNORMAL LOW (ref 4.0–10.5)
nRBC: 0 % (ref 0.0–0.2)

## 2019-03-14 LAB — SAMPLE TO BLOOD BANK

## 2019-03-14 LAB — PREPARE RBC (CROSSMATCH)

## 2019-03-14 MED ORDER — LIDOCAINE HCL 1 % IJ SOLN
INTRAMUSCULAR | Status: AC
  Filled 2019-03-14: qty 20

## 2019-03-14 NOTE — Procedures (Signed)
PROCEDURE SUMMARY:  Successful US guided paracentesis from right lateral abdomen.  Yielded 5.0 liters of pink fluid.  No immediate complications.  Pt tolerated well.   Specimen was sent for labs.  EBL < 74mL  Docia Barrier PA-C 03/14/2019 12:40 PM

## 2019-03-14 NOTE — Telephone Encounter (Signed)
-----   Message from Heath Lark, MD sent at 03/14/2019  9:33 AM EDT ----- Regarding: I placed order for paracentesis for [place]. pls call

## 2019-03-14 NOTE — Telephone Encounter (Signed)
Called and given appt time for paracentesis today at 11 am at [facility]. She verbalized understanding.  Reminded her that 1 unit of blood will be given Friday with treatment and to keep blood bracelet on. She verbalized understanding.

## 2019-03-16 ENCOUNTER — Other Ambulatory Visit: Payer: Self-pay

## 2019-03-16 ENCOUNTER — Inpatient Hospital Stay: Payer: Medicare HMO

## 2019-03-16 ENCOUNTER — Encounter: Payer: Self-pay | Admitting: Hematology and Oncology

## 2019-03-16 ENCOUNTER — Inpatient Hospital Stay: Payer: Medicare HMO | Admitting: Hematology and Oncology

## 2019-03-16 ENCOUNTER — Other Ambulatory Visit: Payer: Self-pay | Admitting: Hematology and Oncology

## 2019-03-16 VITALS — BP 103/47 | HR 82 | Temp 97.7°F | Resp 18

## 2019-03-16 DIAGNOSIS — Z79899 Other long term (current) drug therapy: Secondary | ICD-10-CM

## 2019-03-16 DIAGNOSIS — C786 Secondary malignant neoplasm of retroperitoneum and peritoneum: Secondary | ICD-10-CM | POA: Diagnosis not present

## 2019-03-16 DIAGNOSIS — I700 Atherosclerosis of aorta: Secondary | ICD-10-CM

## 2019-03-16 DIAGNOSIS — D61818 Other pancytopenia: Secondary | ICD-10-CM | POA: Diagnosis not present

## 2019-03-16 DIAGNOSIS — R180 Malignant ascites: Secondary | ICD-10-CM

## 2019-03-16 DIAGNOSIS — T451X5S Adverse effect of antineoplastic and immunosuppressive drugs, sequela: Secondary | ICD-10-CM | POA: Diagnosis not present

## 2019-03-16 DIAGNOSIS — N183 Chronic kidney disease, stage 3 unspecified: Secondary | ICD-10-CM

## 2019-03-16 DIAGNOSIS — J439 Emphysema, unspecified: Secondary | ICD-10-CM

## 2019-03-16 DIAGNOSIS — J90 Pleural effusion, not elsewhere classified: Secondary | ICD-10-CM | POA: Diagnosis not present

## 2019-03-16 DIAGNOSIS — C569 Malignant neoplasm of unspecified ovary: Secondary | ICD-10-CM

## 2019-03-16 DIAGNOSIS — D630 Anemia in neoplastic disease: Secondary | ICD-10-CM

## 2019-03-16 DIAGNOSIS — Z7982 Long term (current) use of aspirin: Secondary | ICD-10-CM

## 2019-03-16 DIAGNOSIS — Z5111 Encounter for antineoplastic chemotherapy: Secondary | ICD-10-CM | POA: Diagnosis not present

## 2019-03-16 LAB — BPAM RBC
Blood Product Expiration Date: 202008052359
Unit Type and Rh: 5100

## 2019-03-16 LAB — TYPE AND SCREEN
ABO/RH(D): O POS
Antibody Screen: NEGATIVE
Unit division: 0

## 2019-03-16 LAB — PREPARE RBC (CROSSMATCH)

## 2019-03-16 MED ORDER — HEPARIN SOD (PORK) LOCK FLUSH 100 UNIT/ML IV SOLN
500.0000 [IU] | Freq: Once | INTRAVENOUS | Status: AC | PRN
  Administered 2019-03-16: 500 [IU]
  Filled 2019-03-16: qty 5

## 2019-03-16 MED ORDER — DIPHENHYDRAMINE HCL 25 MG PO CAPS
25.0000 mg | ORAL_CAPSULE | Freq: Once | ORAL | Status: AC
  Administered 2019-03-16: 25 mg via ORAL

## 2019-03-16 MED ORDER — SODIUM CHLORIDE 0.9% FLUSH
10.0000 mL | INTRAVENOUS | Status: DC | PRN
  Administered 2019-03-16: 10 mL
  Filled 2019-03-16: qty 10

## 2019-03-16 MED ORDER — SODIUM CHLORIDE 0.9 % IV SOLN
Freq: Once | INTRAVENOUS | Status: AC
  Administered 2019-03-16: 11:00:00 via INTRAVENOUS
  Filled 2019-03-16: qty 250

## 2019-03-16 MED ORDER — SODIUM CHLORIDE 0.9 % IV SOLN
600.0000 mg/m2 | Freq: Once | INTRAVENOUS | Status: AC
  Administered 2019-03-16: 1064 mg via INTRAVENOUS
  Filled 2019-03-16: qty 27.98

## 2019-03-16 MED ORDER — ACETAMINOPHEN 325 MG PO TABS
ORAL_TABLET | ORAL | Status: AC
  Filled 2019-03-16: qty 2

## 2019-03-16 MED ORDER — ACETAMINOPHEN 325 MG PO TABS
650.0000 mg | ORAL_TABLET | Freq: Once | ORAL | Status: AC
  Administered 2019-03-16: 650 mg via ORAL

## 2019-03-16 MED ORDER — SODIUM CHLORIDE 0.9% IV SOLUTION
250.0000 mL | Freq: Once | INTRAVENOUS | Status: AC
  Administered 2019-03-16: 250 mL via INTRAVENOUS
  Filled 2019-03-16: qty 250

## 2019-03-16 MED ORDER — DIPHENHYDRAMINE HCL 25 MG PO CAPS
ORAL_CAPSULE | ORAL | Status: AC
  Filled 2019-03-16: qty 1

## 2019-03-16 MED ORDER — PROCHLORPERAZINE MALEATE 10 MG PO TABS
10.0000 mg | ORAL_TABLET | Freq: Once | ORAL | Status: AC
  Administered 2019-03-16: 10 mg via ORAL

## 2019-03-16 NOTE — Assessment & Plan Note (Signed)
We discussed some of the risks, benefits, and alternatives of blood transfusions. The patient is symptomatic from anemia and the hemoglobin level is critically low.  Some of the side-effects to be expected including risks of transfusion reactions, chills, infection, syndrome of volume overload and risk of hospitalization from various reasons and the patient is willing to proceed and went ahead to sign consent today.

## 2019-03-16 NOTE — Patient Instructions (Signed)
[facility] Discharge Instructions for Patients Receiving Chemotherapy  Today you received the following chemotherapy agents Gemzar.  To help prevent nausea and vomiting after your treatment, we encourage you to take your nausea medication as directed.  If you develop nausea and vomiting that is not controlled by your nausea medication, call the clinic.   BELOW ARE SYMPTOMS THAT SHOULD BE REPORTED IMMEDIATELY:  *FEVER GREATER THAN 100.5 F  *CHILLS WITH OR WITHOUT FEVER  NAUSEA AND VOMITING THAT IS NOT CONTROLLED WITH YOUR NAUSEA MEDICATION  *UNUSUAL SHORTNESS OF BREATH  *UNUSUAL BRUISING OR BLEEDING  TENDERNESS IN MOUTH AND THROAT WITH OR WITHOUT PRESENCE OF ULCERS  *URINARY PROBLEMS  *BOWEL PROBLEMS  UNUSUAL RASH Items with * indicate a potential emergency and should be followed up as soon as possible.  Feel free to call the clinic should you have any questions or concerns. The clinic phone number is [phone].  Please show the [place] at check-in to the Emergency Department and triage nurse.   Blood Transfusion, Adult, Care After This sheet gives you information about how to care for yourself after your procedure. Your doctor may also give you more specific instructions. If you have problems or questions, contact your doctor. Follow these instructions at home:   Take over-the-counter and prescription medicines only as told by your doctor.  Go back to your normal activities as told by your doctor.  Follow instructions from your doctor about how to take care of the area where an IV tube was put into your vein (insertion site). Make sure you: ? Wash your hands with soap and water before you change your bandage (dressing). If there is no soap and water, use hand sanitizer. ? Change your bandage as told by your doctor.  Check your IV insertion site every day for signs of infection. Check for: ? More redness, swelling, or pain. ? More fluid or  blood. ? Warmth. ? Pus or a bad smell. Contact a doctor if:  You have more redness, swelling, or pain around the IV insertion site.  You have more fluid or blood coming from the IV insertion site.  Your IV insertion site feels warm to the touch.  You have pus or a bad smell coming from the IV insertion site.  Your pee (urine) turns pink, red, or brown.  You feel weak after doing your normal activities. Get help right away if:  You have signs of a serious allergic or body defense (immune) system reaction, including: ? Itchiness. ? Hives. ? Trouble breathing. ? Anxiety. ? Pain in your chest or lower back. ? Fever, flushing, and chills. ? Fast pulse. ? Rash. ? Watery poop (diarrhea). ? Throwing up (vomiting). ? Dark pee. ? Serious headache. ? Dizziness. ? Stiff neck. ? Yellow color in your face or the white parts of your eyes (jaundice). Summary  After a blood transfusion, return to your normal activities as told by your doctor.  Every day, check for signs of infection where the IV tube was put into your vein.  Some signs of infection are warm skin, more redness and pain, more fluid or blood, and pus or a bad smell where the needle went in.  Contact your doctor if you feel weak or have any unusual symptoms. This information is not intended to replace advice given to you by your health care provider. Make sure you discuss any questions you have with your health care provider. Document Released: 09/13/2014 Document Revised: 12/28/2017 Document  Reviewed: 04/16/2016 Elsevier Patient Education  [employer].

## 2019-03-16 NOTE — Assessment & Plan Note (Signed)
Renal function is stable We will adjust the dose of her chemotherapy accordingly

## 2019-03-16 NOTE — Assessment & Plan Note (Signed)
Unfortunately, she developed significant pancytopenia and persistent recurrent ascites She has therapeutic relief since paracentesis 2 days ago We will proceed with treatment today as scheduled along with transfusion support I recommend minimum 6 doses of chemotherapy or 3 cycles before we repeat imaging study

## 2019-03-16 NOTE — Progress Notes (Signed)
Per Dr. [name] ok to treat with Gemzar today with hemoglobin 7.9

## 2019-03-16 NOTE — Assessment & Plan Note (Signed)
She has persistent recurrent ascites She has therapeutic relief We will continue to monitor closely

## 2019-03-16 NOTE — Assessment & Plan Note (Signed)
She has small persistent pleural effusion on exam but not symptomatic Observe only

## 2019-03-16 NOTE — Progress Notes (Signed)
[facility] OFFICE PROGRESS NOTE  Patient Care Team: Caryl Bis, MD as PCP - General (Unknown Physician Specialty)  ASSESSMENT & PLAN:  Ovarian cancer [facility]) Unfortunately, she developed significant pancytopenia and persistent recurrent ascites She has therapeutic relief since paracentesis 2 days ago We will proceed with treatment today as scheduled along with transfusion support I recommend minimum 6 doses of chemotherapy or 3 cycles before we repeat imaging study  Malignant ascites She has persistent recurrent ascites She has therapeutic relief We will continue to monitor closely  Anemia in neoplastic disease We discussed some of the risks, benefits, and alternatives of blood transfusions. The patient is symptomatic from anemia and the hemoglobin level is critically low.  Some of the side-effects to be expected including risks of transfusion reactions, chills, infection, syndrome of volume overload and risk of hospitalization from various reasons and the patient is willing to proceed and went ahead to sign consent today.   CKD (chronic kidney disease), stage III (HCC) Renal function is stable We will adjust the dose of her chemotherapy accordingly  Pleural effusion on right She has small persistent pleural effusion on exam but not symptomatic Observe only   No orders of the defined types were placed in this encounter.   INTERVAL HISTORY: Please see below for problem oriented charting. She returns for further follow-up She has therapeutic relief and has lost a lot of weight since recent recurrent paracentesis She denies cough, chest pain or shortness of breath Her appetite is stable The patient denies any recent signs or symptoms of bleeding such as spontaneous epistaxis, hematuria or hematochezia.  SUMMARY OF ONCOLOGIC HISTORY: Oncology History Overview Note  MMR normal NTRK normal   Ovarian cancer ([place])  08/25/2018 Initial Diagnosis   Initially  presented with nausea, vomiting and abdominal distension   08/25/2018 Imaging   CT scan demonstrated right pleural thickening and a cardiophrenic lymph node measuring 1.5 cm.  The liver demonstrated mild nodularity and scalloping of the right lobe.  There were 2 less than 1 cm lesions seen within the liver.  There was large volume ascites and a 13 cm upper abdominal left upper quadrant omental cake visualized.  There was extensive peritoneal thickening along the abdomen and pelvic peritoneum.  There is a 1.5 cm aortocaval lymph node identified.  No pelvic adenopathy was identified.  In the pelvis the left adnexa revealed a 4.6 x 3.2 cm ill-defined solid and cystic structure.  No other large dominant pelvic masses identified.  A moderate hiatal hernia was seen and a small umbilical hernia containing fat was seen.   08/29/2018 Tumor Marker   Patient's tumor was tested for the following markers: CA-125. Results of the tumor marker test revealed 1060   09/15/2018 Cancer Staging   Staging form: Ovary, Fallopian Tube, and Primary Peritoneal Carcinoma, AJCC 8th Edition - Clinical: Stage IV (cT3c, cN1, cM1) - Signed by Heath Lark, MD on 09/15/2018   09/15/2018 Tumor Marker   Patient's tumor was tested for the following markers: CA-125. Results of the tumor marker test revealed 1491   09/25/2018 Pathology Results   Omentum, biopsy - HIGH GRADE CARCINOMA, CONSISTENT WITH PAPILLARY SEROUS CARCINOMA. - SEE COMMENT. Microscopic Comment The malignant cells are positive for cytokeratin 7, estrogen receptor, p53, pax-8, and wt-1. They are negative for cytokeratin 20 and CDX-2. The histology, in conjunction with this immunohistochemical profile, supports the above diagnosis. Dr. Vicente Males has reviewed the case and concurs with this interpretation.    09/25/2018 Procedure  Successful 8 French right internal jugular vein power port placement with its tip at the SVC/RA junction.    09/26/2018 Imaging   1.  Examination is positive for nodal metastasis within the anterior and posterior mediastinum as well as right CP angle. 2. There is a small amount of right pleural fluid and pleural soft tissue nodularity which may reflect pleural involvement by tumor. 3. Large volume of ascites with evidence of peritoneal carcinomatosis with omental caking. 4. Aortic Atherosclerosis (ICD10-I70.0) and Emphysema (ICD10-J43.9). 5. Multi vessel coronary artery atherosclerotic calcifications including left main disease    Genetic Testing   Patient has genetic testing done for MMR. Results revealed MMR - normal.   09/29/2018 -  Chemotherapy   The patient had carboplatin and taxol   09/29/2018 -  Chemotherapy   The patient had carboplatin and taxol for chemo   10/20/2018 Tumor Marker   Patient's tumor was tested for the following markers: CA-125. Results of the tumor marker test revealed 2146   10/25/2018 Procedure   Successful ultrasound-guided therapeutic paracentesis yielding 5 liters of peritoneal fluid.    11/14/2018 Procedure   Successful ultrasound-guided paracentesis yielding 2.2 liters of peritoneal fluid.   11/30/2018 Imaging   IMPRESSION: 1. Significant decrease in peritoneal carcinomatosis since previous study. Ascites has mildly increased. 2. Stable left adnexal mass. 3. Stable mild mediastinal lymphadenopathy. 4. No new or progressive metastatic disease identified. 5. Small to moderate hiatal hernia. Small paraumbilical and right inguinal hernias. 6. Colonic diverticulosis, without radiographic evidence of diverticulitis.   12/01/2018 Tumor Marker   Patient's tumor was tested for the following markers: CA-125. Results of the tumor marker test revealed 527   12/25/2018 Tumor Marker   Patient's tumor was tested for the following markers: CA-125. Results of the tumor marker test revealed 561   12/27/2018 Procedure   Successful ultrasound-guided paracentesis yielding 3.3 L of peritoneal  fluid.   01/15/2019 Tumor Marker   Patient's tumor was tested for the following markers: CA-125. Results of the tumor marker test revealed 555   01/31/2019 Procedure   Successful ultrasound-guided paracentesis yielding 3.6 L of peritoneal fluid.   01/31/2019 Imaging   CT CHEST IMPRESSION  1. Response to therapy within the chest. Stable and decreased size of thoracic nodes as detailed above. 2. Status post at least partial right lower lobectomy. Minimal right upper lobe nodularity is unchanged. 3. Similar tiny right pleural effusion. Necrotic node versus loculated pleural fluid about the inferior medial right chest, similar. 4. Aortic atherosclerosis (ICD10-I70.0), coronary artery atherosclerosis and emphysema (ICD10-J43.9).  CT ABDOMEN AND PELVIS IMPRESSION  1. Similar omental/peritoneal metastasis, with decrease in small volume anterior loculated ascites. No obstruction or other acute complication. 2. Decreased size of left ovarian/adnexal mass. 3. Similar and decreased size of low-density left pelvic sidewall lesions. The left common iliac lesion is suspicious for a lymph node. The external iliac lesion may represent a postoperative seroma, given fluid density. 4. Uterine fibroids. 5. Probable right nephrolithiasis. 6. Probable chronic calcific pancreatitis, similar.   02/09/2019 -  Chemotherapy   The patient had carboplatin and gemzar for chemotherapy treatment.    02/09/2019 Tumor Marker   Patient's tumor was tested for the following markers: CA-125. Results of the tumor marker test revealed 161   03/07/2019 Tumor Marker   Patient's tumor was tested for the following markers: CA-125 Results of the tumor marker test revealed 1274   03/07/2019 Procedure   Successful ultrasound-guided therapeutic paracentesis yielding 5 liters of peritoneal fluid. Ordering MD made aware  of fluid color   03/14/2019 Procedure   Successful ultrasound-guided paracentesis yielding 5.0 liters of peritoneal  fluid     REVIEW OF SYSTEMS:   Constitutional: Denies fevers, chills or abnormal weight loss Eyes: Denies blurriness of vision Ears, nose, mouth, throat, and face: Denies mucositis or sore throat Respiratory: Denies cough, dyspnea or wheezes Cardiovascular: Denies palpitation, chest discomfort or lower extremity swelling Gastrointestinal:  Denies nausea, heartburn or change in bowel habits Skin: Denies abnormal skin rashes Lymphatics: Denies new lymphadenopathy or easy bruising Neurological:Denies numbness, tingling or new weaknesses Behavioral/Psych: Mood is stable, no new changes  All other systems were reviewed with the patient and are negative.  I have reviewed the past medical history, past surgical history, social history and family history with the patient and they are unchanged from previous note.  ALLERGIES:  has No Known Allergies.  MEDICATIONS:  Current Outpatient Medications  Medication Sig Dispense Refill  . allopurinol (ZYLOPRIM) 300 MG tablet Take 300 mg by mouth daily.    Marland Kitchen aspirin EC 81 MG tablet Take 81 mg by mouth daily.    Marland Kitchen atorvastatin (LIPITOR) 80 MG tablet Take 80 mg by mouth daily.    . benazepril (LOTENSIN) 20 MG tablet Take 20 mg by mouth daily.    . Cholecalciferol (VITAMIN D3 PO) Take 1,000 Units by mouth daily.    . cloNIDine (CATAPRES) 0.2 MG tablet Take 0.2 mg by mouth 2 (two) times daily.    Marland Kitchen diltiazem (CARDIZEM CD) 300 MG 24 hr capsule Take 300 mg by mouth daily.    Marland Kitchen levothyroxine (SYNTHROID, LEVOTHROID) 100 MCG tablet Take 100 mcg by mouth daily before breakfast.    . lidocaine-prilocaine (EMLA) cream Apply to affected area once 30 g 3  . metFORMIN (GLUCOPHAGE-XR) 500 MG 24 hr tablet Take 2,000 mg by mouth daily with breakfast.    . ondansetron (ZOFRAN) 8 MG tablet Take 1 tablet (8 mg total) by mouth every 8 (eight) hours as needed for refractory nausea / vomiting. Start on day 3 after chemo. 30 tablet 1  . prochlorperazine (COMPAZINE) 10 MG  tablet Take 1 tablet (10 mg total) by mouth every 6 (six) hours as needed (Nausea or vomiting). 30 tablet 1   No current facility-administered medications for this visit.    Facility-Administered Medications Ordered in Other Visits  Medication Dose Route Frequency Provider Last Rate Last Dose  . sodium chloride flush (NS) 0.9 % injection 10 mL  10 mL Intracatheter PRN [name], Sanaii Caporaso, MD   10 mL at 03/16/19 1358    PHYSICAL EXAMINATION: ECOG PERFORMANCE STATUS: 2 - Symptomatic, <50% confined to bed  Vitals:   03/16/19 0948  BP: (!) 109/42  Pulse: 84  Resp: 18  Temp: 98.9 F (37.2 C)  SpO2: 100%   Filed Weights   03/16/19 0948  Weight: 143 lb 12.8 oz (65.2 kg)    GENERAL:alert, no distress and comfortable SKIN: skin color, texture, turgor are normal, no rashes or significant lesions EYES: normal, Conjunctiva are pink and non-injected, sclera clear OROPHARYNX:no exudate, no erythema and lips, buccal mucosa, and tongue normal  NECK: supple, thyroid normal size, non-tender, without nodularity LYMPH:  no palpable lymphadenopathy in the cervical, axillary or inguinal LUNGS: She has reduced breath sounds on the right lung HEART: regular rate & rhythm and no murmurs and no lower extremity edema ABDOMEN:abdomen soft, non-tender and normal bowel sounds.  Her abdomen is grossly distended with ascites Musculoskeletal:no cyanosis of digits and no clubbing  NEURO: alert &  oriented x 3 with fluent speech, no focal motor/sensory deficits  LABORATORY DATA:  I have reviewed the data as listed    Component Value Date/Time   NA 140 03/14/2019 0840   K 4.9 03/14/2019 0840   CL 112 (H) 03/14/2019 0840   CO2 20 (L) 03/14/2019 0840   GLUCOSE 95 03/14/2019 0840   BUN 25 (H) 03/14/2019 0840   CREATININE 1.41 (H) 03/14/2019 0840   CALCIUM 8.4 (L) 03/14/2019 0840   PROT 5.6 (L) 03/14/2019 0840   ALBUMIN 2.7 (L) 03/14/2019 0840   AST 30 03/14/2019 0840   ALT 18 03/14/2019 0840   ALKPHOS 105  03/14/2019 0840   BILITOT 0.2 (L) 03/14/2019 0840   GFRNONAA 38 (L) 03/14/2019 0840   GFRAA 44 (L) 03/14/2019 0840    No results found for: SPEP, UPEP  Lab Results  Component Value Date   WBC 2.6 (L) 03/14/2019   NEUTROABS 1.6 (L) 03/14/2019   HGB 7.9 (L) 03/14/2019   HCT 24.9 (L) 03/14/2019   MCV 91.2 03/14/2019   PLT 208 03/14/2019      Chemistry      Component Value Date/Time   NA 140 03/14/2019 0840   K 4.9 03/14/2019 0840   CL 112 (H) 03/14/2019 0840   CO2 20 (L) 03/14/2019 0840   BUN 25 (H) 03/14/2019 0840   CREATININE 1.41 (H) 03/14/2019 0840      Component Value Date/Time   CALCIUM 8.4 (L) 03/14/2019 0840   ALKPHOS 105 03/14/2019 0840   AST 30 03/14/2019 0840   ALT 18 03/14/2019 0840   BILITOT 0.2 (L) 03/14/2019 0840       RADIOGRAPHIC STUDIES: I have personally reviewed the radiological images as listed and agreed with the findings in the report. US Paracentesis  Result Date: 03/14/2019 INDICATION: Patient with history of ovarian cancer, recurrent asictes. Request made for therapeutic paracentesis. EXAM: ULTRASOUND GUIDED THERAPEUTIC PARACENTESIS MEDICATIONS: 10 mL 1% lidocaine COMPLICATIONS: None immediate. PROCEDURE: Informed written consent was obtained from the patient after a discussion of the risks, benefits and alternatives to treatment. A timeout was performed prior to the initiation of the procedure. Initial ultrasound scanning demonstrates a large amount of ascites within the right lower abdominal quadrant. The right lower abdomen was prepped and draped in the usual sterile fashion. 1% lidocaine was used for local anesthesia. Following this, a 19 gauge, 7-cm, Yueh catheter was introduced. An ultrasound image was saved for documentation purposes. The paracentesis was performed. The catheter was removed and a dressing was applied. The patient tolerated the procedure well without immediate post procedural complication. FINDINGS: A total of approximately 5.0  liters of pink fluid was removed. IMPRESSION: Successful ultrasound-guided paracentesis yielding 5.0 liters of peritoneal fluid. Read by: Brynda Greathouse PA-C Electronically Signed   By: Jerilynn Mages.  Shick M.D.   On: 03/14/2019 15:07   US Paracentesis  Result Date: 03/07/2019 INDICATION: Patient with history of ovarian cancer with recurrent malignant ascites. Request made for therapeutic paracentesis up to 5 liters. EXAM: ULTRASOUND GUIDED THERAPEUTIC PARACENTESIS MEDICATIONS: None COMPLICATIONS: None immediate. PROCEDURE: Informed written consent was obtained from the patient after a discussion of the risks, benefits and alternatives to treatment. A timeout was performed prior to the initiation of the procedure. Initial ultrasound scanning demonstrates a large amount of ascites within the left lower abdominal quadrant. The left lower abdomen was prepped and draped in the usual sterile fashion. 1% lidocaine was used for local anesthesia. Following this, a 19 gauge, 10-cm, Yueh catheter was introduced.  An ultrasound image was saved for documentation purposes. The paracentesis was performed. The catheter was removed and a dressing was applied. The patient tolerated the procedure well without immediate post procedural complication. FINDINGS: A total of approximately 5 liters of blood-tinged fluid was removed. IMPRESSION: Successful ultrasound-guided therapeutic paracentesis yielding 5 liters of peritoneal fluid. Ordering MD made aware of fluid color. Read by: Rowe Robert, PA-C Electronically Signed   By: Sandi Mariscal M.D.   On: 03/07/2019 14:41    All questions were answered. The patient knows to call the clinic with any problems, questions or concerns. No barriers to learning was detected.  I spent 25 minutes counseling the patient face to face. The total time spent in the appointment was 40 minutes and more than 50% was on counseling and review of test results  Heath Lark, MD 03/16/2019 4:07 PM

## 2019-03-18 LAB — BPAM RBC
Blood Product Expiration Date: 202008072359
ISSUE DATE / TIME: 202007101201
Unit Type and Rh: 5100

## 2019-03-18 LAB — TYPE AND SCREEN
ABO/RH(D): O POS
Antibody Screen: NEGATIVE
Unit division: 0

## 2019-03-20 ENCOUNTER — Telehealth: Payer: Self-pay | Admitting: Hematology and Oncology

## 2019-03-20 NOTE — Telephone Encounter (Signed)
I left a message regarding schedule I will mail °

## 2019-03-23 ENCOUNTER — Telehealth: Payer: Self-pay

## 2019-03-23 NOTE — Telephone Encounter (Signed)
Spoke with pt by phone. She reports no SHOB.  No increase in abd. Distention.  Pt denies need for paracentesis at this time.  Advised pt to call back Monday 7/20 to let us know how she is doing.

## 2019-03-23 NOTE — Telephone Encounter (Signed)
-----   Message from Heath Lark, MD sent at 03/23/2019  7:46 AM EDT ----- Regarding: can you call her and check on her? does she feel she needs repeat paracentesis?

## 2019-03-30 ENCOUNTER — Inpatient Hospital Stay: Payer: Medicare HMO

## 2019-03-30 ENCOUNTER — Other Ambulatory Visit: Payer: Self-pay

## 2019-03-30 ENCOUNTER — Telehealth: Payer: Self-pay

## 2019-03-30 VITALS — BP 110/45 | HR 80 | Temp 97.7°F | Resp 16 | Ht 65.0 in | Wt 155.5 lb

## 2019-03-30 DIAGNOSIS — D61818 Other pancytopenia: Secondary | ICD-10-CM

## 2019-03-30 DIAGNOSIS — C569 Malignant neoplasm of unspecified ovary: Secondary | ICD-10-CM

## 2019-03-30 DIAGNOSIS — D630 Anemia in neoplastic disease: Secondary | ICD-10-CM

## 2019-03-30 DIAGNOSIS — Z5111 Encounter for antineoplastic chemotherapy: Secondary | ICD-10-CM | POA: Diagnosis not present

## 2019-03-30 LAB — CMP (CANCER CENTER ONLY)
ALT: 14 U/L (ref 0–44)
AST: 28 U/L (ref 15–41)
Albumin: 2.4 g/dL — ABNORMAL LOW (ref 3.5–5.0)
Alkaline Phosphatase: 118 U/L (ref 38–126)
Anion gap: 7 (ref 5–15)
BUN: 36 mg/dL — ABNORMAL HIGH (ref 8–23)
CO2: 20 mmol/L — ABNORMAL LOW (ref 22–32)
Calcium: 8.6 mg/dL — ABNORMAL LOW (ref 8.9–10.3)
Chloride: 111 mmol/L (ref 98–111)
Creatinine: 1.97 mg/dL — ABNORMAL HIGH (ref 0.44–1.00)
GFR, Est AFR Am: 30 mL/min — ABNORMAL LOW (ref >60)
GFR, Estimated: 25 mL/min — ABNORMAL LOW (ref >60)
Glucose, Bld: 94 mg/dL (ref 70–99)
Potassium: 5.5 mmol/L — ABNORMAL HIGH (ref 3.5–5.1)
Sodium: 138 mmol/L (ref 135–145)
Total Bilirubin: <0.2 mg/dL — ABNORMAL LOW (ref 0.3–1.2)
Total Protein: 5.4 g/dL — ABNORMAL LOW (ref 6.5–8.1)

## 2019-03-30 LAB — CBC WITH DIFFERENTIAL (CANCER CENTER ONLY)
Abs Immature Granulocytes: 0.04 10*3/uL (ref 0.00–0.07)
Basophils Absolute: 0 10*3/uL (ref 0.0–0.1)
Basophils Relative: 1 %
Eosinophils Absolute: 0.1 10*3/uL (ref 0.0–0.5)
Eosinophils Relative: 1 %
HCT: 24.2 % — ABNORMAL LOW (ref 36.0–46.0)
Hemoglobin: 7.8 g/dL — ABNORMAL LOW (ref 12.0–15.0)
Immature Granulocytes: 1 %
Lymphocytes Relative: 19 %
Lymphs Abs: 1 10*3/uL (ref 0.7–4.0)
MCH: 29.4 pg (ref 26.0–34.0)
MCHC: 32.2 g/dL (ref 30.0–36.0)
MCV: 91.3 fL (ref 80.0–100.0)
Monocytes Absolute: 0.4 10*3/uL (ref 0.1–1.0)
Monocytes Relative: 8 %
Neutro Abs: 4 10*3/uL (ref 1.7–7.7)
Neutrophils Relative %: 70 %
Platelet Count: 346 10*3/uL (ref 150–400)
RBC: 2.65 MIL/uL — ABNORMAL LOW (ref 3.87–5.11)
RDW: 15.4 % (ref 11.5–15.5)
WBC Count: 5.6 10*3/uL (ref 4.0–10.5)
nRBC: 0 % (ref 0.0–0.2)

## 2019-03-30 LAB — PREPARE RBC (CROSSMATCH)

## 2019-03-30 LAB — SAMPLE TO BLOOD BANK

## 2019-03-30 MED ORDER — SODIUM CHLORIDE 0.9 % IV SOLN
218.4000 mg | Freq: Once | INTRAVENOUS | Status: AC
  Administered 2019-03-30: 13:00:00 220 mg via INTRAVENOUS
  Filled 2019-03-30: qty 22

## 2019-03-30 MED ORDER — FAMOTIDINE IN NACL 20-0.9 MG/50ML-% IV SOLN
INTRAVENOUS | Status: AC
  Filled 2019-03-30: qty 50

## 2019-03-30 MED ORDER — SODIUM POLYSTYRENE SULFONATE 15 GM/60ML PO SUSP: 30.0000 g | Freq: Once | ORAL | 0 refills | Status: AC

## 2019-03-30 MED ORDER — SODIUM CHLORIDE 0.9% FLUSH
10.0000 mL | Freq: Once | INTRAVENOUS | Status: AC
  Administered 2019-03-30: 10 mL
  Filled 2019-03-30: qty 10

## 2019-03-30 MED ORDER — SODIUM CHLORIDE 0.9 % IV SOLN
Freq: Once | INTRAVENOUS | Status: DC
  Filled 2019-03-30: qty 250

## 2019-03-30 MED ORDER — SODIUM CHLORIDE 0.9% IV SOLUTION
250.0000 mL | Freq: Once | INTRAVENOUS | Status: AC
  Administered 2019-03-30: 11:00:00 250 mL via INTRAVENOUS
  Filled 2019-03-30: qty 250

## 2019-03-30 MED ORDER — PALONOSETRON HCL INJECTION 0.25 MG/5ML
0.2500 mg | Freq: Once | INTRAVENOUS | Status: AC
  Administered 2019-03-30: 0.25 mg via INTRAVENOUS

## 2019-03-30 MED ORDER — DIPHENHYDRAMINE HCL 25 MG PO CAPS: 25.0000 mg | ORAL_CAPSULE | Freq: Once | ORAL | Status: DC

## 2019-03-30 MED ORDER — FAMOTIDINE IN NACL 20-0.9 MG/50ML-% IV SOLN
20.0000 mg | Freq: Once | INTRAVENOUS | Status: AC
  Administered 2019-03-30: 11:00:00 20 mg via INTRAVENOUS

## 2019-03-30 MED ORDER — HEPARIN SOD (PORK) LOCK FLUSH 100 UNIT/ML IV SOLN
500.0000 [IU] | Freq: Once | INTRAVENOUS | Status: AC | PRN
  Administered 2019-03-30: 16:00:00 500 [IU]
  Filled 2019-03-30: qty 5

## 2019-03-30 MED ORDER — PALONOSETRON HCL INJECTION 0.25 MG/5ML
INTRAVENOUS | Status: AC
  Filled 2019-03-30: qty 5

## 2019-03-30 MED ORDER — SODIUM CHLORIDE 0.9 % IV SOLN
600.0000 mg/m2 | Freq: Once | INTRAVENOUS | Status: AC
  Administered 2019-03-30: 13:00:00 1064 mg via INTRAVENOUS
  Filled 2019-03-30: qty 27.98

## 2019-03-30 MED ORDER — DIPHENHYDRAMINE HCL 50 MG/ML IJ SOLN
INTRAMUSCULAR | Status: AC
  Filled 2019-03-30: qty 1

## 2019-03-30 MED ORDER — ACETAMINOPHEN 325 MG PO TABS
ORAL_TABLET | ORAL | Status: AC
  Filled 2019-03-30: qty 2

## 2019-03-30 MED ORDER — SODIUM CHLORIDE 0.9 % IV SOLN
Freq: Once | INTRAVENOUS | Status: AC
  Administered 2019-03-30: 12:00:00 via INTRAVENOUS
  Filled 2019-03-30: qty 5

## 2019-03-30 MED ORDER — DIPHENHYDRAMINE HCL 50 MG/ML IJ SOLN
25.0000 mg | Freq: Once | INTRAMUSCULAR | Status: AC
  Administered 2019-03-30: 25 mg via INTRAVENOUS

## 2019-03-30 MED ORDER — SODIUM CHLORIDE 0.9% FLUSH
10.0000 mL | INTRAVENOUS | Status: DC | PRN
  Administered 2019-03-30: 16:00:00 10 mL
  Filled 2019-03-30: qty 10

## 2019-03-30 MED ORDER — ACETAMINOPHEN 325 MG PO TABS
650.0000 mg | ORAL_TABLET | Freq: Once | ORAL | Status: AC
  Administered 2019-03-30: 650 mg via ORAL

## 2019-03-30 NOTE — Progress Notes (Signed)
Okay to treat with hemoglobin 7.8, per Dr. [name]. She will get 1 unit of blood today.  Okay to treat with creatinine 1.97, per Dr. [name]. Pharmacy to adjust carboplatin dose as needed. Pharmacy notified.

## 2019-03-30 NOTE — Telephone Encounter (Signed)
Talked with her in the infusion room. No bm in 3 days. Per Dr. [name], instructed to stop Lotensin and Kayexalate Rx sent to pharmacy. Instructed to take Kayexalate tonight. She verbalized understanding.

## 2019-03-30 NOTE — Patient Instructions (Signed)
[facility] Discharge Instructions for Patients Receiving Chemotherapy  Today you received the following chemotherapy agents:  Gemzar, Carboplatin  To help prevent nausea and vomiting after your treatment, we encourage you to take your nausea medication as prescribed.   If you develop nausea and vomiting that is not controlled by your nausea medication, call the clinic.   BELOW ARE SYMPTOMS THAT SHOULD BE REPORTED IMMEDIATELY:  *FEVER GREATER THAN 100.5 F  *CHILLS WITH OR WITHOUT FEVER  NAUSEA AND VOMITING THAT IS NOT CONTROLLED WITH YOUR NAUSEA MEDICATION  *UNUSUAL SHORTNESS OF BREATH  *UNUSUAL BRUISING OR BLEEDING  TENDERNESS IN MOUTH AND THROAT WITH OR WITHOUT PRESENCE OF ULCERS  *URINARY PROBLEMS  *BOWEL PROBLEMS  UNUSUAL RASH Items with * indicate a potential emergency and should be followed up as soon as possible.  Feel free to call the clinic should you have any questions or concerns. The clinic phone number is [phone].  Please show the [facility] at check-in to the Emergency Department and triage nurse.     Blood Transfusion, Adult, Care After This sheet gives you information about how to care for yourself after your procedure. Your doctor may also give you more specific instructions. If you have problems or questions, contact your doctor. Follow these instructions at home:   Take over-the-counter and prescription medicines only as told by your doctor.  Go back to your normal activities as told by your doctor.  Follow instructions from your doctor about how to take care of the area where an IV tube was put into your vein (insertion site). Make sure you: ? Wash your hands with soap and water before you change your bandage (dressing). If there is no soap and water, use hand sanitizer. ? Change your bandage as told by your doctor.  Check your IV insertion site every day for signs of infection. Check for: ? More redness, swelling, or  pain. ? More fluid or blood. ? Warmth. ? Pus or a bad smell. Contact a doctor if:  You have more redness, swelling, or pain around the IV insertion site.  You have more fluid or blood coming from the IV insertion site.  Your IV insertion site feels warm to the touch.  You have pus or a bad smell coming from the IV insertion site.  Your pee (urine) turns pink, red, or brown.  You feel weak after doing your normal activities. Get help right away if:  You have signs of a serious allergic or body defense (immune) system reaction, including: ? Itchiness. ? Hives. ? Trouble breathing. ? Anxiety. ? Pain in your chest or lower back. ? Fever, flushing, and chills. ? Fast pulse. ? Rash. ? Watery poop (diarrhea). ? Throwing up (vomiting). ? Dark pee. ? Serious headache. ? Dizziness. ? Stiff neck. ? Yellow color in your face or the white parts of your eyes (jaundice). Summary  After a blood transfusion, return to your normal activities as told by your doctor.  Every day, check for signs of infection where the IV tube was put into your vein.  Some signs of infection are warm skin, more redness and pain, more fluid or blood, and pus or a bad smell where the needle went in.  Contact your doctor if you feel weak or have any unusual symptoms. This information is not intended to replace advice given to you by your health care provider. Make sure you discuss any questions you have with your health care provider. Document Released:  09/13/2014 Document Revised: 12/28/2017 Document Reviewed: 04/16/2016 Elsevier Patient Education  2020 [place] (COVID-19) Are you at risk?  Are you at risk for the Coronavirus (COVID-19)?  To be considered HIGH RISK for Coronavirus (COVID-19), you have to meet the following criteria:  . Traveled to [place], [place], [place], [place] or [place]; or in the [place] to [place], [place], [place], or [place]; and have fever,  cough, and shortness of breath within the last 2 weeks of travel OR . Been in close contact with a person diagnosed with COVID-19 within the last 2 weeks and have fever, cough, and shortness of breath . IF YOU DO NOT MEET THESE CRITERIA, YOU ARE CONSIDERED LOW RISK FOR COVID-19.  What to do if you are HIGH RISK for COVID-19?  Marland Kitchen If you are having a medical emergency, call 911. . Seek medical care right away. Before you go to a doctor's office, urgent care or emergency department, call ahead and tell them about your recent travel, contact with someone diagnosed with COVID-19, and your symptoms. You should receive instructions from your physician's office regarding next steps of care.  . When you arrive at healthcare provider, tell the healthcare staff immediately you have returned from visiting [place], [place], [place], [place] or [place]; or traveled in the [place] to [place], [place], [place], or [place]; in the last two weeks or you have been in close contact with a person diagnosed with COVID-19 in the last 2 weeks.   . Tell the health care staff about your symptoms: fever, cough and shortness of breath. . After you have been seen by a medical provider, you will be either: o Tested for (COVID-19) and discharged home on quarantine except to seek medical care if symptoms worsen, and asked to  - Stay home and avoid contact with others until you get your results (4-5 days)  - Avoid travel on public transportation if possible (such as bus, train, or airplane) or o Sent to the Emergency Department by EMS for evaluation, COVID-19 testing, and possible admission depending on your condition and test results.  What to do if you are LOW RISK for COVID-19?  Reduce your risk of any infection by using the same precautions used for avoiding the common cold or flu:  Marland Kitchen Wash your hands often with soap and warm water for at least 20 seconds.  If soap and water are not readily available, use an  alcohol-based hand sanitizer with at least 60% alcohol.  . If coughing or sneezing, cover your mouth and nose by coughing or sneezing into the elbow areas of your shirt or coat, into a tissue or into your sleeve (not your hands). . Avoid shaking hands with others and consider head nods or verbal greetings only. . Avoid touching your eyes, nose, or mouth with unwashed hands.  . Avoid close contact with people who are sick. . Avoid places or events with large numbers of people in one location, like concerts or sporting events. . Carefully consider travel plans you have or are making. . If you are planning any travel outside or inside the [place], visit the CDC's Travelers' Health webpage for the latest health notices. . If you have some symptoms but not all symptoms, continue to monitor at home and seek medical attention if your symptoms worsen. . If you are having a medical emergency, call 911.   ADDITIONAL HEALTHCARE OPTIONS FOR PATIENTS  [facility] / e-Visit:  [email or web address]         [facility]: [facility]: [phone]                   [facility]: [phone]

## 2019-03-31 LAB — TYPE AND SCREEN
ABO/RH(D): O POS
Antibody Screen: NEGATIVE
Unit division: 0

## 2019-03-31 LAB — BPAM RBC
Blood Product Expiration Date: 202008202359
ISSUE DATE / TIME: 202007241408
Unit Type and Rh: 5100

## 2019-04-06 ENCOUNTER — Other Ambulatory Visit: Payer: Self-pay | Admitting: Hematology and Oncology

## 2019-04-06 ENCOUNTER — Inpatient Hospital Stay (HOSPITAL_BASED_OUTPATIENT_CLINIC_OR_DEPARTMENT_OTHER): Payer: Medicare HMO | Admitting: Hematology and Oncology

## 2019-04-06 ENCOUNTER — Ambulatory Visit (HOSPITAL_COMMUNITY)
Admission: RE | Admit: 2019-04-06 | Discharge: 2019-04-06 | Disposition: A | Discharge status: Home / Self Care | Payer: Medicare HMO | Source: Ambulatory Visit | Attending: Hematology and Oncology | Admitting: Hematology and Oncology

## 2019-04-06 ENCOUNTER — Encounter: Payer: Self-pay | Admitting: Hematology and Oncology

## 2019-04-06 ENCOUNTER — Inpatient Hospital Stay: Payer: Medicare HMO

## 2019-04-06 ENCOUNTER — Other Ambulatory Visit: Payer: Self-pay

## 2019-04-06 VITALS — BP 144/77 | HR 100 | Temp 98.0°F | Resp 18 | Ht 65.0 in | Wt 158.4 lb

## 2019-04-06 DIAGNOSIS — N183 Chronic kidney disease, stage 3 unspecified: Secondary | ICD-10-CM

## 2019-04-06 DIAGNOSIS — R180 Malignant ascites: Secondary | ICD-10-CM

## 2019-04-06 DIAGNOSIS — Z79899 Other long term (current) drug therapy: Secondary | ICD-10-CM | POA: Diagnosis not present

## 2019-04-06 DIAGNOSIS — C569 Malignant neoplasm of unspecified ovary: Secondary | ICD-10-CM

## 2019-04-06 DIAGNOSIS — Z5111 Encounter for antineoplastic chemotherapy: Secondary | ICD-10-CM | POA: Diagnosis not present

## 2019-04-06 DIAGNOSIS — Z7984 Long term (current) use of oral hypoglycemic drugs: Secondary | ICD-10-CM | POA: Diagnosis not present

## 2019-04-06 DIAGNOSIS — Z7982 Long term (current) use of aspirin: Secondary | ICD-10-CM

## 2019-04-06 DIAGNOSIS — J90 Pleural effusion, not elsewhere classified: Secondary | ICD-10-CM

## 2019-04-06 DIAGNOSIS — D61818 Other pancytopenia: Secondary | ICD-10-CM

## 2019-04-06 DIAGNOSIS — J439 Emphysema, unspecified: Secondary | ICD-10-CM | POA: Diagnosis not present

## 2019-04-06 LAB — CBC WITH DIFFERENTIAL (CANCER CENTER ONLY)
Abs Immature Granulocytes: 0.02 10*3/uL (ref 0.00–0.07)
Basophils Absolute: 0 10*3/uL (ref 0.0–0.1)
Basophils Relative: 1 %
Eosinophils Absolute: 0 10*3/uL (ref 0.0–0.5)
Eosinophils Relative: 1 %
HCT: 27.3 % — ABNORMAL LOW (ref 36.0–46.0)
Hemoglobin: 8.9 g/dL — ABNORMAL LOW (ref 12.0–15.0)
Immature Granulocytes: 1 %
Lymphocytes Relative: 31 %
Lymphs Abs: 0.9 10*3/uL (ref 0.7–4.0)
MCH: 29.5 pg (ref 26.0–34.0)
MCHC: 32.6 g/dL (ref 30.0–36.0)
MCV: 90.4 fL (ref 80.0–100.0)
Monocytes Absolute: 0.2 10*3/uL (ref 0.1–1.0)
Monocytes Relative: 5 %
Neutro Abs: 1.9 10*3/uL (ref 1.7–7.7)
Neutrophils Relative %: 61 %
Platelet Count: 292 10*3/uL (ref 150–400)
RBC: 3.02 MIL/uL — ABNORMAL LOW (ref 3.87–5.11)
RDW: 15.2 % (ref 11.5–15.5)
WBC Count: 3.1 10*3/uL — ABNORMAL LOW (ref 4.0–10.5)
nRBC: 0 % (ref 0.0–0.2)

## 2019-04-06 LAB — CMP (CANCER CENTER ONLY)
ALT: 18 U/L (ref 0–44)
AST: 32 U/L (ref 15–41)
Albumin: 2.7 g/dL — ABNORMAL LOW (ref 3.5–5.0)
Alkaline Phosphatase: 122 U/L (ref 38–126)
Anion gap: 9 (ref 5–15)
BUN: 23 mg/dL (ref 8–23)
CO2: 19 mmol/L — ABNORMAL LOW (ref 22–32)
Calcium: 8.6 mg/dL — ABNORMAL LOW (ref 8.9–10.3)
Chloride: 112 mmol/L — ABNORMAL HIGH (ref 98–111)
Creatinine: 1.3 mg/dL — ABNORMAL HIGH (ref 0.44–1.00)
GFR, Est AFR Am: 49 mL/min — ABNORMAL LOW (ref >60)
GFR, Estimated: 42 mL/min — ABNORMAL LOW (ref >60)
Glucose, Bld: 88 mg/dL (ref 70–99)
Potassium: 4.9 mmol/L (ref 3.5–5.1)
Sodium: 140 mmol/L (ref 135–145)
Total Bilirubin: 0.3 mg/dL (ref 0.3–1.2)
Total Protein: 5.8 g/dL — ABNORMAL LOW (ref 6.5–8.1)

## 2019-04-06 LAB — SAMPLE TO BLOOD BANK

## 2019-04-06 MED ORDER — SODIUM CHLORIDE 0.9% FLUSH
10.0000 mL | Freq: Once | INTRAVENOUS | Status: AC
  Administered 2019-04-06: 10:00:00 10 mL
  Filled 2019-04-06: qty 10

## 2019-04-06 MED ORDER — LIDOCAINE HCL 1 % IJ SOLN
INTRAMUSCULAR | Status: AC
  Filled 2019-04-06: qty 10

## 2019-04-06 NOTE — Procedures (Signed)
Ultrasound-guided  therapeutic paracentesis performed yielding 5 liters (maximum ordered) of bloody fluid. No immediate complications. EBL none.  Dr. [name] notified of above findings.

## 2019-04-06 NOTE — Assessment & Plan Note (Signed)
Clinically, she has reduced breath sounds on the right lung I am concerned about recurrent pleural effusion I will order CT imaging of the chest for evaluation

## 2019-04-06 NOTE — Assessment & Plan Note (Signed)
I am very concerned about the recurrent ascites Clinically, she is not improving I recommend discontinuation of chemotherapy and to repeat CT imaging for assessment If she has progressed on chemotherapy, we will have to consider switching treatment I will cancel her treatment today and schedule urgent paracentesis and CT next week

## 2019-04-06 NOTE — Assessment & Plan Note (Signed)
She has intermittent acute on chronic renal failure With discontinuation of ACE inhibitor, her renal function has improved and she has no further hyperkalemia We will monitor her kidney function carefully

## 2019-04-06 NOTE — Progress Notes (Signed)
Patient scheduled for paracentesis at [facility] at 2:15 today. Patient aware. Discussed calling the office with any future concerns when her abdomen starts to swell. Advised patient we can schedule these for her over the phone. She reports she didn't get bothered by it much this time. Patient understands to call in the future.

## 2019-04-06 NOTE — Assessment & Plan Note (Signed)
She has recurrent malignant ascites requiring almost weekly paracentesis Her abdomen looked tense today I will order another repeat paracentesis and CT imaging for objective assessment of response of therapy She agreed

## 2019-04-06 NOTE — Progress Notes (Signed)
[place] OFFICE PROGRESS NOTE  Patient Care Team: Caryl Bis, MD as PCP - General (Unknown Physician Specialty)  ASSESSMENT & PLAN:  Ovarian cancer [facility]) I am very concerned about the recurrent ascites Clinically, she is not improving I recommend discontinuation of chemotherapy and to repeat CT imaging for assessment If she has progressed on chemotherapy, we will have to consider switching treatment I will cancel her treatment today and schedule urgent paracentesis and CT next week  Malignant ascites She has recurrent malignant ascites requiring almost weekly paracentesis Her abdomen looked tense today I will order another repeat paracentesis and CT imaging for objective assessment of response of therapy She agreed  CKD (chronic kidney disease), stage III ([place]) She has intermittent acute on chronic renal failure With discontinuation of ACE inhibitor, her renal function has improved and she has no further hyperkalemia We will monitor her kidney function carefully  Pleural effusion on right Clinically, she has reduced breath sounds on the right lung I am concerned about recurrent pleural effusion I will order CT imaging of the chest for evaluation   Orders Placed This Encounter  Procedures  . IR Paracentesis    Standing Status:   Future    Standing Expiration Date:   06/05/2020    Order Specific Question:   If therapeutic, is there a maximum amount of fluid to be removed?    Answer:   Yes    Comments:   5    Order Specific Question:   What is the maximum amount of fluide to be removed?    Answer:   5 liters    Order Specific Question:   Are labs required for specimen collection?    Answer:   No    Order Specific Question:   Is Albumin medication needed?    Answer:   No    Order Specific Question:   Reason for Exam (SYMPTOM  OR DIAGNOSIS REQUIRED)    Answer:   malignant ascites    Order Specific Question:   Preferred Imaging Location?    Answer:    [facility]  . CT ABDOMEN PELVIS W CONTRAST    Standing Status:   Future    Standing Expiration Date:   04/05/2020    Order Specific Question:   If indicated for the ordered procedure, I authorize the administration of contrast media per Radiology protocol    Answer:   Yes    Order Specific Question:   Preferred imaging location?    Answer:   [facility]    Order Specific Question:   Radiology Contrast Protocol - do NOT remove file path    Answer:   \\[email or web address]  . CT CHEST W CONTRAST    Standing Status:   Future    Standing Expiration Date:   04/05/2020    Order Specific Question:   If indicated for the ordered procedure, I authorize the administration of contrast media per Radiology protocol    Answer:   Yes    Order Specific Question:   Preferred imaging location?    Answer:   [facility]    Order Specific Question:   Radiology Contrast Protocol - do NOT remove file path    Answer:   \\[email or web address]    INTERVAL HISTORY: Please see below for problem oriented charting. She returns for chemotherapy and follow-up Her abdomen has become distended since the last time I saw her She denies nausea but have reduced appetite and early satiety due  to significant abdominal distention She denies cough, chest pain or shortness of breath She denies bowel habit changes Denies side effects from chemotherapy so far  SUMMARY OF ONCOLOGIC HISTORY: Oncology History Overview Note  MMR normal NTRK normal   Ovarian cancer ([place])  08/25/2018 Initial Diagnosis   Initially presented with nausea, vomiting and abdominal distension   08/25/2018 Imaging   CT scan demonstrated right pleural thickening and a cardiophrenic lymph node measuring 1.5 cm.  The liver demonstrated mild nodularity and scalloping of the right lobe.  There were 2 less than 1 cm lesions seen within the liver.  There was large volume ascites and a 13 cm  upper abdominal left upper quadrant omental cake visualized.  There was extensive peritoneal thickening along the abdomen and pelvic peritoneum.  There is a 1.5 cm aortocaval lymph node identified.  No pelvic adenopathy was identified.  In the pelvis the left adnexa revealed a 4.6 x 3.2 cm ill-defined solid and cystic structure.  No other large dominant pelvic masses identified.  A moderate hiatal hernia was seen and a small umbilical hernia containing fat was seen.   08/29/2018 Tumor Marker   Patient's tumor was tested for the following markers: CA-125. Results of the tumor marker test revealed 1060   09/15/2018 Cancer Staging   Staging form: Ovary, Fallopian Tube, and Primary Peritoneal Carcinoma, AJCC 8th Edition - Clinical: Stage IV (cT3c, cN1, cM1) - Signed by Heath Lark, MD on 09/15/2018   09/15/2018 Tumor Marker   Patient's tumor was tested for the following markers: CA-125. Results of the tumor marker test revealed 1491   09/25/2018 Pathology Results   Omentum, biopsy - HIGH GRADE CARCINOMA, CONSISTENT WITH PAPILLARY SEROUS CARCINOMA. - SEE COMMENT. Microscopic Comment The malignant cells are positive for cytokeratin 7, estrogen receptor, p53, pax-8, and wt-1. They are negative for cytokeratin 20 and CDX-2. The histology, in conjunction with this immunohistochemical profile, supports the above diagnosis. Dr. Vicente Males has reviewed the case and concurs with this interpretation.    09/25/2018 Procedure   Successful 8 French right internal jugular vein power port placement with its tip at the SVC/RA junction.    09/26/2018 Imaging   1. Examination is positive for nodal metastasis within the anterior and posterior mediastinum as well as right CP angle. 2. There is a small amount of right pleural fluid and pleural soft tissue nodularity which may reflect pleural involvement by tumor. 3. Large volume of ascites with evidence of peritoneal carcinomatosis with omental caking. 4. Aortic  Atherosclerosis (ICD10-I70.0) and Emphysema (ICD10-J43.9). 5. Multi vessel coronary artery atherosclerotic calcifications including left main disease    Genetic Testing   Patient has genetic testing done for MMR. Results revealed MMR - normal.   09/29/2018 -  Chemotherapy   The patient had carboplatin and taxol   09/29/2018 -  Chemotherapy   The patient had carboplatin and taxol for chemo   10/20/2018 Tumor Marker   Patient's tumor was tested for the following markers: CA-125. Results of the tumor marker test revealed 2146   10/25/2018 Procedure   Successful ultrasound-guided therapeutic paracentesis yielding 5 liters of peritoneal fluid.    11/14/2018 Procedure   Successful ultrasound-guided paracentesis yielding 2.2 liters of peritoneal fluid.   11/30/2018 Imaging   IMPRESSION: 1. Significant decrease in peritoneal carcinomatosis since previous study. Ascites has mildly increased. 2. Stable left adnexal mass. 3. Stable mild mediastinal lymphadenopathy. 4. No new or progressive metastatic disease identified. 5. Small to moderate hiatal hernia. Small paraumbilical and right  inguinal hernias. 6. Colonic diverticulosis, without radiographic evidence of diverticulitis.   12/01/2018 Tumor Marker   Patient's tumor was tested for the following markers: CA-125. Results of the tumor marker test revealed 527   12/25/2018 Tumor Marker   Patient's tumor was tested for the following markers: CA-125. Results of the tumor marker test revealed 561   12/27/2018 Procedure   Successful ultrasound-guided paracentesis yielding 3.3 L of peritoneal fluid.   01/15/2019 Tumor Marker   Patient's tumor was tested for the following markers: CA-125. Results of the tumor marker test revealed 555   01/31/2019 Procedure   Successful ultrasound-guided paracentesis yielding 3.6 L of peritoneal fluid.   01/31/2019 Imaging   CT CHEST IMPRESSION  1. Response to therapy within the chest. Stable and decreased  size of thoracic nodes as detailed above. 2. Status post at least partial right lower lobectomy. Minimal right upper lobe nodularity is unchanged. 3. Similar tiny right pleural effusion. Necrotic node versus loculated pleural fluid about the inferior medial right chest, similar. 4. Aortic atherosclerosis (ICD10-I70.0), coronary artery atherosclerosis and emphysema (ICD10-J43.9).  CT ABDOMEN AND PELVIS IMPRESSION  1. Similar omental/peritoneal metastasis, with decrease in small volume anterior loculated ascites. No obstruction or other acute complication. 2. Decreased size of left ovarian/adnexal mass. 3. Similar and decreased size of low-density left pelvic sidewall lesions. The left common iliac lesion is suspicious for a lymph node. The external iliac lesion may represent a postoperative seroma, given fluid density. 4. Uterine fibroids. 5. Probable right nephrolithiasis. 6. Probable chronic calcific pancreatitis, similar.   02/09/2019 -  Chemotherapy   The patient had carboplatin and gemzar for chemotherapy treatment.    02/09/2019 Tumor Marker   Patient's tumor was tested for the following markers: CA-125. Results of the tumor marker test revealed 673   03/07/2019 Tumor Marker   Patient's tumor was tested for the following markers: CA-125 Results of the tumor marker test revealed 1274   03/07/2019 Procedure   Successful ultrasound-guided therapeutic paracentesis yielding 5 liters of peritoneal fluid. Ordering MD made aware of fluid color   03/14/2019 Procedure   Successful ultrasound-guided paracentesis yielding 5.0 liters of peritoneal fluid     REVIEW OF SYSTEMS:   Constitutional: Denies fevers, chills or abnormal weight loss Eyes: Denies blurriness of vision Ears, nose, mouth, throat, and face: Denies mucositis or sore throat Respiratory: Denies cough, dyspnea or wheezes Cardiovascular: Denies palpitation, chest discomfort or lower extremity swelling Gastrointestinal:  Denies  nausea, heartburn or change in bowel habits Skin: Denies abnormal skin rashes Lymphatics: Denies new lymphadenopathy or easy bruising Neurological:Denies numbness, tingling or new weaknesses Behavioral/Psych: Mood is stable, no new changes  All other systems were reviewed with the patient and are negative.  I have reviewed the past medical history, past surgical history, social history and family history with the patient and they are unchanged from previous note.  ALLERGIES:  has No Known Allergies.  MEDICATIONS:  Current Outpatient Medications  Medication Sig Dispense Refill  . allopurinol (ZYLOPRIM) 300 MG tablet Take 300 mg by mouth daily.    Marland Kitchen aspirin EC 81 MG tablet Take 81 mg by mouth daily.    Marland Kitchen atorvastatin (LIPITOR) 80 MG tablet Take 80 mg by mouth daily.    . Cholecalciferol (VITAMIN D3 PO) Take 1,000 Units by mouth daily.    . cloNIDine (CATAPRES) 0.2 MG tablet Take 0.2 mg by mouth 2 (two) times daily.    Marland Kitchen diltiazem (CARDIZEM CD) 300 MG 24 hr capsule Take 300 mg by mouth  daily.    . levothyroxine (SYNTHROID, LEVOTHROID) 100 MCG tablet Take 100 mcg by mouth daily before breakfast.    . lidocaine-prilocaine (EMLA) cream Apply to affected area once 30 g 3  . metFORMIN (GLUCOPHAGE-XR) 500 MG 24 hr tablet Take 2,000 mg by mouth daily with breakfast.    . ondansetron (ZOFRAN) 8 MG tablet Take 1 tablet (8 mg total) by mouth every 8 (eight) hours as needed for refractory nausea / vomiting. Start on day 3 after chemo. 30 tablet 1  . prochlorperazine (COMPAZINE) 10 MG tablet Take 1 tablet (10 mg total) by mouth every 6 (six) hours as needed (Nausea or vomiting). 30 tablet 1   No current facility-administered medications for this visit.    Facility-Administered Medications Ordered in Other Visits  Medication Dose Route Frequency Provider Last Rate Last Dose  . lidocaine (XYLOCAINE) 1 % (with pres) injection             PHYSICAL EXAMINATION: ECOG PERFORMANCE STATUS: 2 - Symptomatic,  <50% confined to bed  Vitals:   04/06/19 1013  BP: (!) 144/77  Pulse: 100  Resp: 18  Temp: 98 F (36.7 C)  SpO2: 100%   Filed Weights   04/06/19 1013  Weight: 158 lb 6.4 oz (71.8 kg)    GENERAL:alert, no distress and comfortable SKIN: skin color, texture, turgor are normal, no rashes or significant lesions EYES: normal, Conjunctiva are pink and non-injected, sclera clear OROPHARYNX:no exudate, no erythema and lips, buccal mucosa, and tongue normal  NECK: supple, thyroid normal size, non-tender, without nodularity LYMPH:  no palpable lymphadenopathy in the cervical, axillary or inguinal LUNGS: Normal breathing effort with reduced breath sounds on the right lung base HEART: regular rate & rhythm and no murmurs and no lower extremity edema ABDOMEN:abdomen soft, grossly distended with ascites Musculoskeletal:no cyanosis of digits and no clubbing  NEURO: alert & oriented x 3 with fluent speech, no focal motor/sensory deficits  LABORATORY DATA:  I have reviewed the data as listed    Component Value Date/Time   NA 140 04/06/2019 1011   K 4.9 04/06/2019 1011   CL 112 (H) 04/06/2019 1011   CO2 19 (L) 04/06/2019 1011   GLUCOSE 88 04/06/2019 1011   BUN 23 04/06/2019 1011   CREATININE 1.30 (H) 04/06/2019 1011   CALCIUM 8.6 (L) 04/06/2019 1011   PROT 5.8 (L) 04/06/2019 1011   ALBUMIN 2.7 (L) 04/06/2019 1011   AST 32 04/06/2019 1011   ALT 18 04/06/2019 1011   ALKPHOS 122 04/06/2019 1011   BILITOT 0.3 04/06/2019 1011   GFRNONAA 42 (L) 04/06/2019 1011   GFRAA 49 (L) 04/06/2019 1011    No results found for: SPEP, UPEP  Lab Results  Component Value Date   WBC 3.1 (L) 04/06/2019   NEUTROABS 1.9 04/06/2019   HGB 8.9 (L) 04/06/2019   HCT 27.3 (L) 04/06/2019   MCV 90.4 04/06/2019   PLT 292 04/06/2019      Chemistry      Component Value Date/Time   NA 140 04/06/2019 1011   K 4.9 04/06/2019 1011   CL 112 (H) 04/06/2019 1011   CO2 19 (L) 04/06/2019 1011   BUN 23 04/06/2019  1011   CREATININE 1.30 (H) 04/06/2019 1011      Component Value Date/Time   CALCIUM 8.6 (L) 04/06/2019 1011   ALKPHOS 122 04/06/2019 1011   AST 32 04/06/2019 1011   ALT 18 04/06/2019 1011   BILITOT 0.3 04/06/2019 1011  RADIOGRAPHIC STUDIES: I have personally reviewed the radiological images as listed and agreed with the findings in the report. US Paracentesis  Result Date: 03/14/2019 INDICATION: Patient with history of ovarian cancer, recurrent asictes. Request made for therapeutic paracentesis. EXAM: ULTRASOUND GUIDED THERAPEUTIC PARACENTESIS MEDICATIONS: 10 mL 1% lidocaine COMPLICATIONS: None immediate. PROCEDURE: Informed written consent was obtained from the patient after a discussion of the risks, benefits and alternatives to treatment. A timeout was performed prior to the initiation of the procedure. Initial ultrasound scanning demonstrates a large amount of ascites within the right lower abdominal quadrant. The right lower abdomen was prepped and draped in the usual sterile fashion. 1% lidocaine was used for local anesthesia. Following this, a 19 gauge, 7-cm, Yueh catheter was introduced. An ultrasound image was saved for documentation purposes. The paracentesis was performed. The catheter was removed and a dressing was applied. The patient tolerated the procedure well without immediate post procedural complication. FINDINGS: A total of approximately 5.0 liters of pink fluid was removed. IMPRESSION: Successful ultrasound-guided paracentesis yielding 5.0 liters of peritoneal fluid. Read by: Brynda Greathouse PA-C Electronically Signed   By: Jerilynn Mages.  Shick M.D.   On: 03/14/2019 15:07   US Paracentesis  Result Date: 03/07/2019 INDICATION: Patient with history of ovarian cancer with recurrent malignant ascites. Request made for therapeutic paracentesis up to 5 liters. EXAM: ULTRASOUND GUIDED THERAPEUTIC PARACENTESIS MEDICATIONS: None COMPLICATIONS: None immediate. PROCEDURE: Informed written  consent was obtained from the patient after a discussion of the risks, benefits and alternatives to treatment. A timeout was performed prior to the initiation of the procedure. Initial ultrasound scanning demonstrates a large amount of ascites within the left lower abdominal quadrant. The left lower abdomen was prepped and draped in the usual sterile fashion. 1% lidocaine was used for local anesthesia. Following this, a 19 gauge, 10-cm, Yueh catheter was introduced. An ultrasound image was saved for documentation purposes. The paracentesis was performed. The catheter was removed and a dressing was applied. The patient tolerated the procedure well without immediate post procedural complication. FINDINGS: A total of approximately 5 liters of blood-tinged fluid was removed. IMPRESSION: Successful ultrasound-guided therapeutic paracentesis yielding 5 liters of peritoneal fluid. Ordering MD made aware of fluid color. Read by: Rowe Robert, PA-C Electronically Signed   By: Sandi Mariscal M.D.   On: 03/07/2019 14:41    All questions were answered. The patient knows to call the clinic with any problems, questions or concerns. No barriers to learning was detected.  I spent 30 minutes counseling the patient face to face. The total time spent in the appointment was 40 minutes and more than 50% was on counseling and review of test results  Heath Lark, MD 04/06/2019 2:37 PM

## 2019-04-07 LAB — CA 125: Cancer Antigen (CA) 125: 2200 U/mL — ABNORMAL HIGH (ref 0.0–38.1)

## 2019-04-17 ENCOUNTER — Telehealth: Payer: Self-pay | Admitting: *Deleted

## 2019-04-17 ENCOUNTER — Other Ambulatory Visit: Payer: Self-pay | Admitting: Hematology and Oncology

## 2019-04-17 DIAGNOSIS — C569 Malignant neoplasm of unspecified ovary: Secondary | ICD-10-CM

## 2019-04-17 DIAGNOSIS — R180 Malignant ascites: Secondary | ICD-10-CM

## 2019-04-17 NOTE — Telephone Encounter (Signed)
I have to choose Internal location Pls see if you can scehdule this If possible, I think it would make more sense to do it before CT so that less fluid obscure the CT scan

## 2019-04-17 NOTE — Telephone Encounter (Signed)
Appointment has been scheduled.  Paracentesis scheduled before CT scan- patient confirms appt.

## 2019-04-17 NOTE — Telephone Encounter (Signed)
Patient called requesting a paracentesis to be scheduled on Friday following her CT Scan on Thursday- Forestine Na is US Paracentesis, per scheduling dept don't put a location in the order.

## 2019-04-19 ENCOUNTER — Other Ambulatory Visit: Payer: Self-pay

## 2019-04-19 ENCOUNTER — Telehealth: Payer: Self-pay

## 2019-04-19 ENCOUNTER — Inpatient Hospital Stay (HOSPITAL_COMMUNITY)
Admission: EM | Admit: 2019-04-19 | Discharge: 2019-04-23 | DRG: 176 | Disposition: A | Discharge status: Home / Self Care | Payer: Medicare HMO | Attending: Internal Medicine | Admitting: Internal Medicine

## 2019-04-19 ENCOUNTER — Encounter (HOSPITAL_COMMUNITY): Payer: Self-pay

## 2019-04-19 ENCOUNTER — Ambulatory Visit (HOSPITAL_COMMUNITY)
Admission: RE | Admit: 2019-04-19 | Discharge: 2019-04-19 | Disposition: A | Discharge status: Home / Self Care | Payer: Medicare HMO | Source: Ambulatory Visit | Attending: Hematology and Oncology | Admitting: Hematology and Oncology

## 2019-04-19 ENCOUNTER — Encounter (HOSPITAL_COMMUNITY): Payer: Self-pay | Admitting: Emergency Medicine

## 2019-04-19 DIAGNOSIS — Z902 Acquired absence of lung [part of]: Secondary | ICD-10-CM

## 2019-04-19 DIAGNOSIS — N183 Chronic kidney disease, stage 3 unspecified: Secondary | ICD-10-CM | POA: Diagnosis present

## 2019-04-19 DIAGNOSIS — R188 Other ascites: Secondary | ICD-10-CM

## 2019-04-19 DIAGNOSIS — R180 Malignant ascites: Secondary | ICD-10-CM

## 2019-04-19 DIAGNOSIS — E039 Hypothyroidism, unspecified: Secondary | ICD-10-CM | POA: Diagnosis present

## 2019-04-19 DIAGNOSIS — Z800 Family history of malignant neoplasm of digestive organs: Secondary | ICD-10-CM | POA: Diagnosis not present

## 2019-04-19 DIAGNOSIS — Z20828 Contact with and (suspected) exposure to other viral communicable diseases: Secondary | ICD-10-CM | POA: Diagnosis present

## 2019-04-19 DIAGNOSIS — C786 Secondary malignant neoplasm of retroperitoneum and peritoneum: Secondary | ICD-10-CM | POA: Diagnosis present

## 2019-04-19 DIAGNOSIS — I129 Hypertensive chronic kidney disease with stage 1 through stage 4 chronic kidney disease, or unspecified chronic kidney disease: Secondary | ICD-10-CM | POA: Diagnosis present

## 2019-04-19 DIAGNOSIS — M1A9XX0 Chronic gout, unspecified, without tophus (tophi): Secondary | ICD-10-CM | POA: Diagnosis present

## 2019-04-19 DIAGNOSIS — E119 Type 2 diabetes mellitus without complications: Secondary | ICD-10-CM | POA: Diagnosis not present

## 2019-04-19 DIAGNOSIS — Z79899 Other long term (current) drug therapy: Secondary | ICD-10-CM | POA: Diagnosis not present

## 2019-04-19 DIAGNOSIS — C569 Malignant neoplasm of unspecified ovary: Secondary | ICD-10-CM | POA: Diagnosis not present

## 2019-04-19 DIAGNOSIS — N179 Acute kidney failure, unspecified: Secondary | ICD-10-CM | POA: Diagnosis not present

## 2019-04-19 DIAGNOSIS — R0602 Shortness of breath: Secondary | ICD-10-CM | POA: Diagnosis not present

## 2019-04-19 DIAGNOSIS — Z8543 Personal history of malignant neoplasm of ovary: Secondary | ICD-10-CM | POA: Diagnosis not present

## 2019-04-19 DIAGNOSIS — R140 Abdominal distension (gaseous): Secondary | ICD-10-CM | POA: Diagnosis present

## 2019-04-19 DIAGNOSIS — E1122 Type 2 diabetes mellitus with diabetic chronic kidney disease: Secondary | ICD-10-CM | POA: Diagnosis present

## 2019-04-19 DIAGNOSIS — Z87891 Personal history of nicotine dependence: Secondary | ICD-10-CM

## 2019-04-19 DIAGNOSIS — Z794 Long term (current) use of insulin: Secondary | ICD-10-CM

## 2019-04-19 DIAGNOSIS — J181 Lobar pneumonia, unspecified organism: Secondary | ICD-10-CM | POA: Diagnosis not present

## 2019-04-19 DIAGNOSIS — E875 Hyperkalemia: Secondary | ICD-10-CM | POA: Diagnosis not present

## 2019-04-19 DIAGNOSIS — I10 Essential (primary) hypertension: Secondary | ICD-10-CM | POA: Diagnosis present

## 2019-04-19 DIAGNOSIS — D638 Anemia in other chronic diseases classified elsewhere: Secondary | ICD-10-CM | POA: Diagnosis present

## 2019-04-19 DIAGNOSIS — Z85118 Personal history of other malignant neoplasm of bronchus and lung: Secondary | ICD-10-CM

## 2019-04-19 DIAGNOSIS — I2699 Other pulmonary embolism without acute cor pulmonale: Secondary | ICD-10-CM | POA: Diagnosis present

## 2019-04-19 DIAGNOSIS — Z79890 Hormone replacement therapy (postmenopausal): Secondary | ICD-10-CM | POA: Diagnosis not present

## 2019-04-19 DIAGNOSIS — R0609 Other forms of dyspnea: Secondary | ICD-10-CM | POA: Diagnosis not present

## 2019-04-19 DIAGNOSIS — E785 Hyperlipidemia, unspecified: Secondary | ICD-10-CM | POA: Diagnosis present

## 2019-04-19 DIAGNOSIS — I361 Nonrheumatic tricuspid (valve) insufficiency: Secondary | ICD-10-CM | POA: Diagnosis not present

## 2019-04-19 DIAGNOSIS — R0600 Dyspnea, unspecified: Secondary | ICD-10-CM

## 2019-04-19 DIAGNOSIS — N281 Cyst of kidney, acquired: Secondary | ICD-10-CM | POA: Diagnosis not present

## 2019-04-19 DIAGNOSIS — J439 Emphysema, unspecified: Secondary | ICD-10-CM | POA: Diagnosis not present

## 2019-04-19 DIAGNOSIS — Z66 Do not resuscitate: Secondary | ICD-10-CM | POA: Diagnosis present

## 2019-04-19 LAB — CBC WITH DIFFERENTIAL/PLATELET
Abs Immature Granulocytes: 0.01 10*3/uL (ref 0.00–0.07)
Basophils Absolute: 0 10*3/uL (ref 0.0–0.1)
Basophils Relative: 1 %
Eosinophils Absolute: 0 10*3/uL (ref 0.0–0.5)
Eosinophils Relative: 1 %
HCT: 37.5 % (ref 36.0–46.0)
Hemoglobin: 11.7 g/dL — ABNORMAL LOW (ref 12.0–15.0)
Immature Granulocytes: 0 %
Lymphocytes Relative: 17 %
Lymphs Abs: 0.6 10*3/uL — ABNORMAL LOW (ref 0.7–4.0)
MCH: 29.6 pg (ref 26.0–34.0)
MCHC: 31.2 g/dL (ref 30.0–36.0)
MCV: 94.9 fL (ref 80.0–100.0)
Monocytes Absolute: 0.3 10*3/uL (ref 0.1–1.0)
Monocytes Relative: 10 %
Neutro Abs: 2.5 10*3/uL (ref 1.7–7.7)
Neutrophils Relative %: 71 %
Platelets: 282 10*3/uL (ref 150–400)
RBC: 3.95 MIL/uL (ref 3.87–5.11)
RDW: 18.2 % — ABNORMAL HIGH (ref 11.5–15.5)
WBC: 3.5 10*3/uL — ABNORMAL LOW (ref 4.0–10.5)
nRBC: 0 % (ref 0.0–0.2)

## 2019-04-19 LAB — COMPREHENSIVE METABOLIC PANEL
ALT: 21 U/L (ref 0–44)
AST: 37 U/L (ref 15–41)
Albumin: 2.4 g/dL — ABNORMAL LOW (ref 3.5–5.0)
Alkaline Phosphatase: 110 U/L (ref 38–126)
Anion gap: 11 (ref 5–15)
BUN: 36 mg/dL — ABNORMAL HIGH (ref 8–23)
CO2: 17 mmol/L — ABNORMAL LOW (ref 22–32)
Calcium: 8.1 mg/dL — ABNORMAL LOW (ref 8.9–10.3)
Chloride: 106 mmol/L (ref 98–111)
Creatinine, Ser: 2.14 mg/dL — ABNORMAL HIGH (ref 0.44–1.00)
GFR calc Af Amer: 27 mL/min — ABNORMAL LOW (ref >60)
GFR calc non Af Amer: 23 mL/min — ABNORMAL LOW (ref >60)
Glucose, Bld: 102 mg/dL — ABNORMAL HIGH (ref 70–99)
Potassium: 5.2 mmol/L — ABNORMAL HIGH (ref 3.5–5.1)
Sodium: 134 mmol/L — ABNORMAL LOW (ref 135–145)
Total Bilirubin: 0.2 mg/dL — ABNORMAL LOW (ref 0.3–1.2)
Total Protein: 5.1 g/dL — ABNORMAL LOW (ref 6.5–8.1)

## 2019-04-19 LAB — TROPONIN I (HIGH SENSITIVITY): Troponin I (High Sensitivity): 13 ng/L (ref <18)

## 2019-04-19 LAB — PROTIME-INR
INR: 1 (ref 0.8–1.2)
Prothrombin Time: 12.7 seconds (ref 11.4–15.2)

## 2019-04-19 LAB — SARS CORONAVIRUS 2 BY RT PCR (HOSPITAL ORDER, PERFORMED IN ~~LOC~~ HOSPITAL LAB): SARS Coronavirus 2: NEGATIVE

## 2019-04-19 LAB — BRAIN NATRIURETIC PEPTIDE: B Natriuretic Peptide: 67.6 pg/mL (ref 0.0–100.0)

## 2019-04-19 MED ORDER — IOHEXOL 300 MG/ML  SOLN
100.0000 mL | Freq: Once | INTRAMUSCULAR | Status: AC | PRN
  Administered 2019-04-19: 13:00:00 100 mL via INTRAVENOUS

## 2019-04-19 MED ORDER — HEPARIN (PORCINE) 25000 UT/250ML-% IV SOLN
1150.0000 [IU]/h | INTRAVENOUS | Status: DC
  Administered 2019-04-19: 1150 [IU]/h via INTRAVENOUS
  Filled 2019-04-19 (×2): qty 250

## 2019-04-19 MED ORDER — HEPARIN BOLUS VIA INFUSION
3500.0000 [IU] | Freq: Once | INTRAVENOUS | Status: AC
  Administered 2019-04-19: 3500 [IU] via INTRAVENOUS
  Filled 2019-04-19: qty 3500

## 2019-04-19 NOTE — Progress Notes (Signed)
Paracentesis complete no signs of distress.  

## 2019-04-19 NOTE — Telephone Encounter (Signed)
Received return call from Shanon Brow, pt's son. Explained to Shanon Brow that pt has bilat PEs and needs to go to the emergency room to be admitted.  He verbalizes understanding.

## 2019-04-19 NOTE — H&P (Addendum)
History and Physical   Christy Ellis:[id number] DOB: May 06, 1951 DOA: 04/19/2019  Referring MD/NP/PA: Dr. Laverta Baltimore  PCP: Caryl Bis, MD   Outpatient Specialists: Heath Lark, MD, oncology  Patient coming from: Home  Chief Complaint: Shortness of breath  HPI: Christy Ellis is a 68 y.o. female with medical history significant of ovarian cancer, chronic kidney disease stage III, diabetes, hypertension, hyperlipidemia, remote history of non-small cell lung cancer, hypothyroidism and gout who presented to the ER with progressive shortness of breath and abdominal distention.  Symptoms started about 1 week ago with exertional dyspnea.  It has progressively gotten worse.  Associated with cough.  She was seen and evaluated by her physicians with CT angiogram of the chest showing bilateral PE.  Patient was sent over for evaluation.  She has not been on anticoagulation.  She has completed initial chemo and currently on oral therapy.  Patient describes some chest discomfort on the right lower rib cage.  Also pain in the right groin area.  She otherwise denied any leg swelling or any pain.  With her malignancy and bilateral PE she is being admitted to the hospital for treatment and evaluation..  ED Course: Temperature is 98.3 blood pressure 152/82 pulse 110 respiratory 22 oxygen sat 95% room air.  White count 3.5 hemoglobin 11.7 and platelets of 282.  Sodium 134 potassium is 5.2 chloride 106 CO2 17 BUN 36 creatinine 2.14 and calcium 8.1.  BNP of 67.6.  CT abdomen pelvis and CT 8 chest shows acute bilateral PE extending proximally to the lobar level.  There is evidence of right heart strain consistent with at least submassive PE.  Significant mediastinal adenopathy.  Large volume ascites which is increased with diffuse irregular peritoneal thickening worsened compatible with progressive diffuse peritoneal carcinomatosis.  Patient is being admitted to the hospital and will be seen and followed by  oncology.  Review of Systems: As per HPI otherwise 10 point review of systems negative.    Past Medical History:  Diagnosis Date   Chronic gouty arthritis    Diabetes mellitus without complication ([place])    Type 2   Hyperlipidemia    Hypertension    Lung cancer ([place]) 2007   History of Non Small Cell Lung Cancer   Thyroid disease    Hypothyroid    Past Surgical History:  Procedure Laterality Date   COLONOSCOPY  02/19/2016   IR IMAGING GUIDED PORT INSERTION  09/25/2018   IR PARACENTESIS  11/14/2018   IR PARACENTESIS  12/27/2018   IR PARACENTESIS  01/31/2019   IR US GUIDE BX ASP/DRAIN  09/25/2018   OTHER SURGICAL HISTORY  09/2005   Video-assisted thoracoscopic surgery for lung cancer by Dr. Prescott Gum   THYROID SURGERY     Some type of thyroid surgery at age 83     reports that she quit smoking about 13 years ago. Her smoking use included cigarettes. She has never used smokeless tobacco. She reports that she does not drink alcohol or use drugs.  No Known Allergies  Family History  Problem Relation Age of Onset   Stomach cancer Mother      Prior to Admission medications   Medication Sig Start Date End Date Taking? Authorizing Provider  allopurinol (ZYLOPRIM) 300 MG tablet Take 300 mg by mouth daily.   Yes [provider]  aspirin EC 81 MG tablet Take 81 mg by mouth daily.   Yes [provider]  atorvastatin (LIPITOR) 80 MG tablet Take 80 mg by  mouth daily.   Yes [provider]  Cholecalciferol (VITAMIN D3 PO) Take 1,000 Units by mouth daily.   Yes [provider]  cloNIDine (CATAPRES) 0.2 MG tablet Take 0.2 mg by mouth 2 (two) times daily.   Yes [provider]  levothyroxine (SYNTHROID, LEVOTHROID) 100 MCG tablet Take 100 mcg by mouth daily before breakfast.   Yes [provider]  metFORMIN (GLUCOPHAGE-XR) 500 MG 24 hr tablet Take 2,000 mg by mouth daily with breakfast.   Yes [provider]   ondansetron (ZOFRAN) 8 MG tablet Take 1 tablet (8 mg total) by mouth every 8 (eight) hours as needed for refractory nausea / vomiting. Start on day 3 after chemo. 09/28/18  Yes Heath Lark, MD  prochlorperazine (COMPAZINE) 10 MG tablet Take 1 tablet (10 mg total) by mouth every 6 (six) hours as needed (Nausea or vomiting). 09/28/18  Yes Heath Lark, MD  lidocaine-prilocaine (EMLA) cream Apply to affected area once 09/28/18   Heath Lark, MD    Physical Exam: Vitals:   04/19/19 1831 04/19/19 1901 04/19/19 1915 04/19/19 2100  BP: (!) 152/82 (!) 146/119 117/61 (!) 134/57  Pulse: (!) 110 89 83 81  Resp: (!) 22 17 (!) 21 15  Temp: 98.3 F (36.8 C)     SpO2: 100% 97% 99% 97%      Constitutional: NAD, chronically ill looking, Vitals:   04/19/19 1831 04/19/19 1901 04/19/19 1915 04/19/19 2100  BP: (!) 152/82 (!) 146/119 117/61 (!) 134/57  Pulse: (!) 110 89 83 81  Resp: (!) 22 17 (!) 21 15  Temp: 98.3 F (36.8 C)     SpO2: 100% 97% 99% 97%   Eyes: PERRL, lids and conjunctivae normal ENMT: Mucous membranes are moist. Posterior pharynx clear of any exudate or lesions.Normal dentition.  Neck: normal, supple, no masses, no thyromegaly Respiratory: clear to auscultation bilaterally, no wheezing, no crackles. Normal respiratory effort. No accessory muscle use.  Cardiovascular: Sinus tachycardia, no murmurs / rubs / gallops. No extremity edema. 2+ pedal pulses. No carotid bruits.  Port-A-Cath in place Abdomen: Distended, positive ascites, diffuse tenderness, bowel sounds positive.  Musculoskeletal: no clubbing / cyanosis. No joint deformity upper and lower extremities. Good ROM, no contractures. Normal muscle tone.  Skin: no rashes, lesions, ulcers. No induration Neurologic: CN 2-12 grossly intact. Sensation intact, DTR normal. Strength 5/5 in all 4.  Psychiatric: Normal judgment and insight. Alert and oriented x 3. Normal mood.     Labs on Admission: I have personally reviewed following labs  and imaging studies  CBC: Recent Labs  Lab 04/19/19 2000  WBC 3.5*  NEUTROABS 2.5  HGB 11.7*  HCT 37.5  MCV 94.9  PLT 993   Basic Metabolic Panel: Recent Labs  Lab 04/19/19 2000  NA 134*  K 5.2*  CL 106  CO2 17*  GLUCOSE 102*  BUN 36*  CREATININE 2.14*  CALCIUM 8.1*   GFR: Estimated Creatinine Clearance: 25 mL/min (A) (by C-G formula based on SCr of 2.14 mg/dL (H)). Liver Function Tests: Recent Labs  Lab 04/19/19 2000  AST 37  ALT 21  ALKPHOS 110  BILITOT 0.2*  PROT 5.1*  ALBUMIN 2.4*   No results for input(s): LIPASE, AMYLASE in the last 168 hours. No results for input(s): AMMONIA in the last 168 hours. Coagulation Profile: Recent Labs  Lab 04/19/19 2000  INR 1.0   Cardiac Enzymes: No results for input(s): CKTOTAL, CKMB, CKMBINDEX, TROPONINI in the last 168 hours. BNP (last 3 results) No results  for input(s): PROBNP in the last 8760 hours. HbA1C: No results for input(s): HGBA1C in the last 72 hours. CBG: No results for input(s): GLUCAP in the last 168 hours. Lipid Profile: No results for input(s): CHOL, HDL, LDLCALC, TRIG, CHOLHDL, LDLDIRECT in the last 72 hours. Thyroid Function Tests: No results for input(s): TSH, T4TOTAL, FREET4, T3FREE, THYROIDAB in the last 72 hours. Anemia Panel: No results for input(s): VITAMINB12, FOLATE, FERRITIN, TIBC, IRON, RETICCTPCT in the last 72 hours. Urine analysis: No results found for: COLORURINE, APPEARANCEUR, LABSPEC, PHURINE, GLUCOSEU, HGBUR, BILIRUBINUR, KETONESUR, PROTEINUR, UROBILINOGEN, NITRITE, LEUKOCYTESUR Sepsis Labs: @LABRCNTIP (procalcitonin:4,lacticidven:4) ) Recent Results (from the past 240 hour(s))  SARS Coronavirus 2 [facility] order, Performed in [facility] hospital lab) Nasopharyngeal Nasopharyngeal Swab     Status: None   Collection Time: 04/19/19  8:57 PM   Specimen: Nasopharyngeal Swab  Result Value Ref Range Status   SARS Coronavirus 2 NEGATIVE NEGATIVE Final    Comment: (NOTE) If  result is NEGATIVE SARS-CoV-2 target nucleic acids are NOT DETECTED. The SARS-CoV-2 RNA is generally detectable in upper and lower  respiratory specimens during the acute phase of infection. The lowest  concentration of SARS-CoV-2 viral copies this assay can detect is 250  copies / mL. A negative result does not preclude SARS-CoV-2 infection  and should not be used as the sole basis for treatment or other  patient management decisions.  A negative result may occur with  improper specimen collection / handling, submission of specimen other  than nasopharyngeal swab, presence of viral mutation(s) within the  areas targeted by this assay, and inadequate number of viral copies  (<250 copies / mL). A negative result must be combined with clinical  observations, patient history, and epidemiological information. If result is POSITIVE SARS-CoV-2 target nucleic acids are DETECTED. The SARS-CoV-2 RNA is generally detectable in upper and lower  respiratory specimens dur ing the acute phase of infection.  Positive  results are indicative of active infection with SARS-CoV-2.  Clinical  correlation with patient history and other diagnostic information is  necessary to determine patient infection status.  Positive results do  not rule out bacterial infection or co-infection with other viruses. If result is PRESUMPTIVE POSTIVE SARS-CoV-2 nucleic acids MAY BE PRESENT.   A presumptive positive result was obtained on the submitted specimen  and confirmed on repeat testing.  While 2019 novel coronavirus  (SARS-CoV-2) nucleic acids may be present in the submitted sample  additional confirmatory testing may be necessary for epidemiological  and / or clinical management purposes  to differentiate between  SARS-CoV-2 and other Sarbecovirus currently known to infect humans.  If clinically indicated additional testing with an alternate test  methodology [phone]) is advised. The SARS-CoV-2 RNA is generally   detectable in upper and lower respiratory sp ecimens during the acute  phase of infection. The expected result is Negative. Fact Sheet for Patients:  StrictlyIdeas.no Fact Sheet for Healthcare Providers: BankingDealers.co.za This test is not yet approved or cleared by the [place] FDA and has been authorized for detection and/or diagnosis of SARS-CoV-2 by FDA under an Emergency Use Authorization (EUA).  This EUA will remain in effect (meaning this test can be used) for the duration of the COVID-19 declaration under Section 564(b)(1) of the Act, 21 U.S.C. section 360bbb-3(b)(1), unless the authorization is terminated or revoked sooner. 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 Restaging. Paracentesis earlier today. EXAM: CT CHEST, ABDOMEN, AND PELVIS WITH CONTRAST TECHNIQUE: Multidetector CT imaging of the chest, abdomen and pelvis was performed following the standard protocol during bolus administration of intravenous contrast. CONTRAST:  128mL OMNIPAQUE IOHEXOL 300 MG/ML  SOLN COMPARISON:  01/31/2019 CT chest, abdomen and pelvis. FINDINGS: CT CHEST FINDINGS Cardiovascular: Normal heart size. No significant pericardial effusion/thickening. Left main and 3 vessel coronary atherosclerosis. Right internal jugular Port-A-Cath terminates in the lower third of the SVC. Atherosclerotic nonaneurysmal thoracic aorta. Normal caliber pulmonary arteries. Bilateral acute pulmonary embolism involving lobar, segmental and subsegmental branches. RV/LV ratio 1.3. Mediastinum/Nodes: Right hemithyroidectomy. No left thyroid nodules. Patulous thoracic esophagus filled with oral contrast. No axillary adenopathy. Mildly enlarged  1.0 cm right pericardiophrenic node, stable. Prevascular adenopathy measuring up to 1.1 cm (series 2/image 23), stable. No new pathologically enlarged mediastinal nodes. No hilar adenopathy. Lungs/Pleura: No pneumothorax. Trace dependent right pleural effusion, stable. No left pleural effusion. Moderate centrilobular emphysema. Status post right lower lobectomy. Stable peripheral right upper lobe 4 mm solid pulmonary nodule (series 4/image 62). No acute consolidative airspace disease, lung masses or new significant pulmonary nodules. Musculoskeletal: No aggressive appearing focal osseous lesions. Stable post thoracotomy change in the posterior right ribs. CT ABDOMEN PELVIS FINDINGS Hepatobiliary: Normal liver size. Subcentimeter hypodense right liver lesion is too small to characterize and is stable. No new liver lesions. Normal gallbladder with no radiopaque cholelithiasis. No biliary ductal dilatation. Pancreas: Stable coarse calcifications in the pancreatic head compatible with chronic pancreatitis. No pancreatic mass or duct dilation. Spleen: Normal size. No mass. Adrenals/Urinary Tract: Normal adrenals. Simple exophytic 5.0 cm posterior upper left renal cyst. Several subcentimeter hypodense renal cortical lesions in both kidneys are too small to characterize and are unchanged. No hydronephrosis. Collapsed bladder without acute abnormality. Stomach/Bowel: Moderate hiatal hernia. Stomach is nondistended. Mass-effect on the stomach by ascitic fluid in the lesser omentum. Normal caliber small bowel with no small bowel wall thickening. Appendix not discretely visualized. Oral contrast transits to the rectum. Normal large bowel with no diverticulosis, large bowel wall thickening or pericolonic fat stranding. Vascular/Lymphatic: Atherosclerotic nonaneurysmal abdominal aorta. Patent portal, splenic, hepatic and renal veins. No pathologically enlarged lymph nodes in the abdomen or pelvis. Reproductive: Stable  heterogeneous uterus. No discrete adnexal masses. Other: Large volume ascites extending into small to moderate right inguinal hernia and small umbilical hernia, increased in volume. No pneumoperitoneum. There is diffuse irregular peritoneal thickening and hyperenhancement throughout the abdomen and pelvis, which is worsened in the interval. Representative anterior pelvic 1.7 cm implant (series 2/image 90), previously 1.2 cm on 01/31/2019 CT. Left pelvic sidewall peritoneal thickening up to 0.9 cm (series 2/image 104), previously 0.7 cm. Left upper quadrant peritoneal thickness 1.7 cm (series 2/image 59), previously 1.3 cm. Right lower quadrant peritoneal thickness 1.0 cm (series 2/image 93), previously 0.7 cm. Mild anasarca. Musculoskeletal: No aggressive appearing focal osseous lesions. Moderate lower lumbar spondylosis. IMPRESSION: 1. Acute bilateral pulmonary embolism, extending proximally to the lobar level. 2. Positive for acute PE with CT evidence of right heart strain (RV/LV Ratio = 1.3) consistent with at least submassive (intermediate risk) PE. The presence of right heart strain has been associated with an increased risk of morbidity and mortality. 3. Mediastinal adenopathy is stable. Stable trace dependent right pleural effusion. No new or progressive metastatic disease in the chest. 4. Large volume ascites is increased. Diffuse irregular peritoneal thickening and hyperenhancement has worsened,  compatible with progressive diffuse peritoneal carcinomatosis. 5. Chronic findings include: Aortic Atherosclerosis (ICD10-I70.0) and Emphysema (ICD10-J43.9). Left main and 3 vessel coronary atherosclerosis. Moderate hiatal hernia. Chronic pancreatitis. Critical Value/emergent results were called by telephone at the time of interpretation on 04/19/2019 at 3:59 pm to Dr. Heath Lark , who verbally acknowledged these results. Electronically Signed   By: Ilona Sorrel M.D.   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 Restaging. Paracentesis earlier today. EXAM: CT CHEST, ABDOMEN, AND PELVIS WITH CONTRAST TECHNIQUE: Multidetector CT imaging of the chest, abdomen and pelvis was performed following the standard protocol during bolus administration of intravenous contrast. CONTRAST:  154mL OMNIPAQUE IOHEXOL 300 MG/ML  SOLN COMPARISON:  01/31/2019 CT chest, abdomen and pelvis. FINDINGS: CT CHEST FINDINGS Cardiovascular: Normal heart size. No significant pericardial effusion/thickening. Left main and 3 vessel coronary atherosclerosis. Right internal jugular Port-A-Cath terminates in the lower third of the SVC. Atherosclerotic nonaneurysmal thoracic aorta. Normal caliber pulmonary arteries. Bilateral acute pulmonary embolism involving lobar, segmental and subsegmental branches. RV/LV ratio 1.3. Mediastinum/Nodes: Right hemithyroidectomy. No left thyroid nodules. Patulous thoracic esophagus filled with oral contrast. No axillary adenopathy. Mildly enlarged 1.0 cm right pericardiophrenic node, stable. Prevascular adenopathy measuring up to 1.1 cm (series 2/image 23), stable. No new pathologically enlarged mediastinal nodes. No hilar adenopathy. Lungs/Pleura: No pneumothorax. Trace dependent right pleural effusion, stable. No left pleural effusion. Moderate centrilobular emphysema. Status post right lower lobectomy. Stable peripheral right upper lobe 4 mm solid pulmonary nodule (series 4/image 62). No acute consolidative airspace disease, lung masses or new significant pulmonary nodules. Musculoskeletal: No aggressive appearing focal osseous lesions. Stable post thoracotomy change in the posterior right ribs. CT ABDOMEN PELVIS FINDINGS Hepatobiliary: Normal liver size. Subcentimeter hypodense right liver lesion is too small to characterize and is stable. No new liver lesions. Normal gallbladder  with no radiopaque cholelithiasis. No biliary ductal dilatation. Pancreas: Stable coarse calcifications in the pancreatic head compatible with chronic pancreatitis. No pancreatic mass or duct dilation. Spleen: Normal size. No mass. Adrenals/Urinary Tract: Normal adrenals. Simple exophytic 5.0 cm posterior upper left renal cyst. Several subcentimeter hypodense renal cortical lesions in both kidneys are too small to characterize and are unchanged. No hydronephrosis. Collapsed bladder without acute abnormality. Stomach/Bowel: Moderate hiatal hernia. Stomach is nondistended. Mass-effect on the stomach by ascitic fluid in the lesser omentum. Normal caliber small bowel with no small bowel wall thickening. Appendix not discretely visualized. Oral contrast transits to the rectum. Normal large bowel with no diverticulosis, large bowel wall thickening or pericolonic fat stranding. Vascular/Lymphatic: Atherosclerotic nonaneurysmal abdominal aorta. Patent portal, splenic, hepatic and renal veins. No pathologically enlarged lymph nodes in the abdomen or pelvis. Reproductive: Stable heterogeneous uterus. No discrete adnexal masses. Other: Large volume ascites extending into small to moderate right inguinal hernia and small umbilical hernia, increased in volume. No pneumoperitoneum. There is diffuse irregular peritoneal thickening and hyperenhancement throughout the abdomen and pelvis, which is worsened in the interval. Representative anterior pelvic 1.7 cm implant (series 2/image 90), previously 1.2 cm on 01/31/2019 CT. Left pelvic sidewall peritoneal thickening up to 0.9 cm (series 2/image 104), previously 0.7 cm. Left upper quadrant peritoneal thickness 1.7 cm (series 2/image 59), previously 1.3 cm. Right lower quadrant peritoneal thickness 1.0 cm (series 2/image 93), previously 0.7 cm. Mild anasarca. Musculoskeletal: No aggressive appearing focal osseous lesions. Moderate lower lumbar spondylosis. IMPRESSION: 1. Acute  bilateral pulmonary embolism,  extending proximally to the lobar level. 2. Positive for acute PE with CT evidence of right heart strain (RV/LV Ratio = 1.3) consistent with at least submassive (intermediate risk) PE. The presence of right heart strain has been associated with an increased risk of morbidity and mortality. 3. Mediastinal adenopathy is stable. Stable trace dependent right pleural effusion. No new or progressive metastatic disease in the chest. 4. Large volume ascites is increased. Diffuse irregular peritoneal thickening and hyperenhancement has worsened, compatible with progressive diffuse peritoneal carcinomatosis. 5. Chronic findings include: Aortic Atherosclerosis (ICD10-I70.0) and Emphysema (ICD10-J43.9). Left main and 3 vessel coronary atherosclerosis. Moderate hiatal hernia. Chronic pancreatitis. Critical Value/emergent results were called by telephone at the time of interpretation on 04/19/2019 at 3:59 pm to Dr. Heath Lark , who verbally acknowledged these results. Electronically Signed   By: Ilona Sorrel M.D.   On: 04/19/2019 16:05   US Paracentesis  Result Date: 04/19/2019 INDICATION: Malignant ascites. EXAM: ULTRASOUND GUIDED therapeutic PARACENTESIS MEDICATIONS: None. COMPLICATIONS: None immediate. PROCEDURE: Informed written consent was obtained from the patient after a discussion of the risks, benefits and alternatives to treatment. A timeout was performed prior to the initiation of the procedure. Initial ultrasound scanning demonstrates a large amount of ascites within the right lower abdominal quadrant. The right lower abdomen was prepped and draped in the usual sterile fashion. 1% lidocaine with epinephrine was used for local anesthesia. Following this, a paracentesis catheter was introduced. An ultrasound image was saved for documentation purposes. The paracentesis was performed. The catheter was removed and a dressing was applied. The patient tolerated the procedure well without  immediate post procedural complication. FINDINGS: A total of approximately 5 L of serosanguineous fluid was removed. IMPRESSION: Successful ultrasound-guided paracentesis yielding 5 liters of peritoneal fluid. Electronically Signed   By: Marijo Conception M.D.   On: 04/19/2019 11:31      Assessment/Plan Principal Problem:   Pulmonary embolism and infarction [facility]) Active Problems:   Ovarian cancer ([place])   Essential hypertension   History of lung cancer   Diabetes mellitus without complication ([place])   CKD (chronic kidney disease), stage III ([place])     #1 bilateral PE: Patient will be admitted and started on IV heparin.  We will also's start on oxygen and pain management.  She will get oncology consultation in the morning with decision regarding further anticoagulation.  Echocardiogram will be ordered to assess the extent of right heart strain  #2 ovarian cancer: Continue care according to oncology.  #3 diabetes: Hold metformin.  Sliding scale insulin.  #4 hypertension: Resume home regimen and monitor closely.  #5 hyperkalemia: Repeat potassium.  If elevated may require Kayexalate.  #6 chronic kidney disease stage III: BUN/creatinine is elevated.  Continue to monitor.    DVT prophylaxis: Heparin drip Code Status: Full code Family Communication: No family at bedside Disposition Plan: Home Consults called: Consult oncology in the morning Admission status: Inpatient  Severity of Illness: The appropriate patient status for this patient is INPATIENT. Inpatient status is judged to be reasonable and necessary in order to provide the required intensity of service to ensure the patient's safety. The patient's presenting symptoms, physical exam findings, and initial radiographic and laboratory data in the context of their chronic comorbidities is felt to place them at high risk for further clinical deterioration. Furthermore, it is not anticipated that the patient will be medically stable for  discharge from the hospital within 2 midnights of admission. The following factors support the patient status of inpatient.   "  The patient's presenting symptoms include shortness of breath. " The worrisome physical exam findings include distended abdomen. " The initial radiographic and laboratory data are worrisome because of CT showing bilateral PE. " The chronic co-morbidities include ovarian cancer.   * I certify that at the point of admission it is my clinical judgment that the patient will require inpatient hospital care spanning beyond 2 midnights from the point of admission due to high intensity of service, high risk for further deterioration and high frequency of surveillance required.Barbette Merino MD [facility] Pager [phone]  If 7PM-7AM, please contact night-coverage [email or web address] Password [facility]  04/19/2019, 11:51 PM

## 2019-04-19 NOTE — Progress Notes (Signed)
ANTICOAGULATION CONSULT NOTE - Initial Consult  Pharmacy Consult for IV heparin Indication: pulmonary embolus  No Known Allergies  Patient Measurements:    Height: 5'5" Weight: 71.8 kg Heparin Dosing Weight: 71.4 kg  Vital Signs: Temp: 98.3 F (36.8 C) (08/13 1831) Temp Source: Oral (08/13 1030) BP: 117/61 (08/13 1915) Pulse Rate: 83 (08/13 1915)  Labs: No results for input(s): HGB, HCT, PLT, APTT, LABPROT, INR, HEPARINUNFRC, HEPRLOWMOCWT, CREATININE, CKTOTAL, CKMB, TROPONINIHS in the last 72 hours.  Estimated Creatinine Clearance: 41.2 mL/min (A) (by C-G formula based on SCr of 1.3 mg/dL (H)).   Medical History: Past Medical History:  Diagnosis Date  . Chronic gouty arthritis   . Diabetes mellitus without complication (HCC)    Type 2  . Hyperlipidemia   . Hypertension   . Lung cancer ([place]) 2007   History of Non Small Cell Lung Cancer  . Thyroid disease    Hypothyroid    Assessment: 20 y/oF with PMH of ovarian cancer admitted with bilateral PE with CT evidence of right heart strain. Pharmacy consulted for IV heparin dosing. Baseline labs ordered and are pending. Patient not on any anticoagulants PTA.   Goal of Therapy:  Heparin level 0.3-0.7 units/ml Monitor platelets by anticoagulation protocol: Yes   Plan:   After baseline CBC, PT/INR, and aPTT drawn, give heparin 3500 units IV bolus x 1, then start heparin infusion at 1150 units/hr  Heparin level 6 hours after infusion initiated  Daily CBC, heparin level  Monitor closely for s/sx of bleeding    Lindell Spar, PharmD, BCPS Clinical Pharmacist  04/19/2019,7:17 PM

## 2019-04-19 NOTE — ED Triage Notes (Addendum)
Patient sent by oncologist for evaluation of bilateral PE. States hx ovarian cancer.

## 2019-04-19 NOTE — Telephone Encounter (Signed)
Shanon Brow called back to say he is in route to bring his mother to [facility] ED

## 2019-04-19 NOTE — Telephone Encounter (Signed)
Attempted to contact pt to let her know per Dr [name] she needs to go to the ED d/t CT scan shows bilat. PEs. LVM stating "very important that you return my call".  Gave nurse direct line for pt to call to. Attempted to contact her son - no answer, no vm.

## 2019-04-19 NOTE — ED Provider Notes (Signed)
Emergency Department Provider Note   I have reviewed the triage vital signs and the nursing notes.   HISTORY  Chief Complaint Shortness of Breath   HPI Christy Ellis is a 68 y.o. female with PMH of DM, CKD, and ovarian cancer presents to the emergency department after having a CT scan today showing bilateral PE.  Patient reports 1 to 2 weeks of exertional dyspnea which improves at rest.  She is not having chest pain.  No fevers or chills.  She was undergoing CT imaging from her oncologist who called her with the results today and referred her to the emergency department.  She is not experiencing abdominal pain, vomiting, diarrhea.  She is not anticoagulated.  Denies any unilateral leg pain or swelling.  Past Medical History:  Diagnosis Date   Chronic gouty arthritis    Diabetes mellitus without complication ([place])    Type 2   Hyperlipidemia    Hypertension    Lung cancer ([place]) 2007   History of Non Small Cell Lung Cancer   Thyroid disease    Hypothyroid    Patient Active Problem List   Diagnosis Date Noted   Pulmonary embolism and infarction ([place]) 04/19/2019   Pleural effusion on right 03/07/2019   Pancytopenia, acquired ([place]) 12/25/2018   Peripheral neuropathy due to chemotherapy ([place]) 12/25/2018   Anemia in neoplastic disease 10/20/2018   Malignant ascites 10/20/2018   CKD (chronic kidney disease), stage III ([place]) 09/29/2018   Goals of care, counseling/discussion 09/29/2018   Ovarian cancer ([place]) 09/15/2018   Essential hypertension 09/15/2018   History of lung cancer 09/15/2018   Diabetes mellitus without complication ([place]) 02/58/5277    Past Surgical History:  Procedure Laterality Date   COLONOSCOPY  02/19/2016   IR IMAGING GUIDED PORT INSERTION  09/25/2018   IR PARACENTESIS  11/14/2018   IR PARACENTESIS  12/27/2018   IR PARACENTESIS  01/31/2019   IR US GUIDE BX ASP/DRAIN  09/25/2018   OTHER SURGICAL HISTORY  09/2005   Video-assisted  thoracoscopic surgery for lung cancer by Dr. Prescott Gum   THYROID SURGERY     Some type of thyroid surgery at age 74    Allergies Patient has no known allergies.  Family History  Problem Relation Age of Onset   Stomach cancer Mother     Social History Social History   Tobacco Use   Smoking status: Former Smoker    Types: Cigarettes    Quit date: 09/05/2005    Years since quitting: 13.6   Smokeless tobacco: Never Used  Substance Use Topics   Alcohol use: Never    Frequency: Never   Drug use: Never    Review of Systems  Constitutional: No fever/chills Eyes: No visual changes. ENT: No sore throat. Cardiovascular: Denies chest pain. Respiratory: Positive exertional shortness of breath. Gastrointestinal: No abdominal pain.  No nausea, no vomiting.  No diarrhea.  No constipation. Genitourinary: Negative for dysuria. Musculoskeletal: Negative for back pain. Skin: Negative for rash. Neurological: Negative for headaches, focal weakness or numbness.  10-point ROS otherwise negative.  ____________________________________________   PHYSICAL EXAM:  VITAL SIGNS: ED Triage Vitals [04/19/19 1831]  Enc Vitals Group     BP (!) 152/82     Pulse Rate (!) 110     Resp (!) 22     Temp 98.3 F (36.8 C)     Temp src      SpO2 100 %   Constitutional: Alert and oriented. Well appearing and in no acute distress.  Eyes: Conjunctivae are normal.  Head: Atraumatic. Nose: No congestion/rhinnorhea. Mouth/Throat: Mucous membranes are moist.  Neck: No stridor.   Cardiovascular: Tachycardia. Good peripheral circulation. Grossly normal heart sounds.   Respiratory: Normal respiratory effort.  No retractions. Lungs CTAB. Gastrointestinal: Soft and nontender. Mild distention.  Musculoskeletal: No lower extremity tenderness nor edema. No gross deformities of extremities. Neurologic:  Normal speech and language.    ____________________________________________   LABS (all labs  ordered are listed, but only abnormal results are displayed)  Labs Reviewed  COMPREHENSIVE METABOLIC PANEL - Abnormal; Notable for the following components:      Result Value   Sodium 134 (*)    Potassium 5.2 (*)    CO2 17 (*)    Glucose, Bld 102 (*)    BUN 36 (*)    Creatinine, Ser 2.14 (*)    Calcium 8.1 (*)    Total Protein 5.1 (*)    Albumin 2.4 (*)    Total Bilirubin 0.2 (*)    GFR calc non Af Amer 23 (*)    GFR calc Af Amer 27 (*)    All other components within normal limits  CBC WITH DIFFERENTIAL/PLATELET - Abnormal; Notable for the following components:   WBC 3.5 (*)    Hemoglobin 11.7 (*)    RDW 18.2 (*)    Lymphs Abs 0.6 (*)    All other components within normal limits  HEPARIN LEVEL (UNFRACTIONATED) - Abnormal; Notable for the following components:   Heparin Unfractionated 1.12 (*)    All other components within normal limits  HEMOGLOBIN A1C - Abnormal; Notable for the following components:   Hgb A1c MFr Bld 5.9 (*)    All other components within normal limits  SARS CORONAVIRUS 2 (HOSPITAL ORDER, [place] [facility])  BRAIN NATRIURETIC PEPTIDE  PROTIME-INR  CBC  HIV ANTIBODY (ROUTINE TESTING W REFLEX)  COMPREHENSIVE METABOLIC PANEL  HEPARIN LEVEL (UNFRACTIONATED)  TROPONIN I (HIGH SENSITIVITY)  TROPONIN I (HIGH SENSITIVITY)   ____________________________________________  EKG   EKG Interpretation  Date/Time:  Thursday April 19 2019 19:01:45 EDT Ventricular Rate:  86 PR Interval:    QRS Duration: 139 QT Interval:  364 QTC Calculation: 436 R Axis:   56 Text Interpretation:  Sinus rhythm Nonspecific intraventricular conduction delay No STEMI  Confirmed by Nanda Quinton [phone]) on 04/19/2019 7:07:27 PM Also confirmed by Nanda Quinton [phone]), editor Philomena Doheny [phone])  on 04/20/2019 7:06:01 AM       ____________________________________________  RADIOLOGY  Ct Chest W Contrast  Result Date: 04/19/2019 CLINICAL DATA:  Stage IV  papillary serous ovarian cancer diagnosed December 2019 with ongoing chemotherapy. Restaging. Paracentesis earlier today. EXAM: CT CHEST, ABDOMEN, AND PELVIS WITH CONTRAST TECHNIQUE: Multidetector CT imaging of the chest, abdomen and pelvis was performed following the standard protocol during bolus administration of intravenous contrast. CONTRAST:  186mL OMNIPAQUE IOHEXOL 300 MG/ML  SOLN COMPARISON:  01/31/2019 CT chest, abdomen and pelvis. FINDINGS: CT CHEST FINDINGS Cardiovascular: Normal heart size. No significant pericardial effusion/thickening. Left main and 3 vessel coronary atherosclerosis. Right internal jugular Port-A-Cath terminates in the lower third of the SVC. Atherosclerotic nonaneurysmal thoracic aorta. Normal caliber pulmonary arteries. Bilateral acute pulmonary embolism involving lobar, segmental and subsegmental branches. RV/LV ratio 1.3. Mediastinum/Nodes: Right hemithyroidectomy. No left thyroid nodules. Patulous thoracic esophagus filled with oral contrast. No axillary adenopathy. Mildly enlarged 1.0 cm right pericardiophrenic node, stable. Prevascular adenopathy measuring up to 1.1 cm (series 2/image 23), stable. No new pathologically enlarged mediastinal nodes. No hilar adenopathy. Lungs/Pleura:  No pneumothorax. Trace dependent right pleural effusion, stable. No left pleural effusion. Moderate centrilobular emphysema. Status post right lower lobectomy. Stable peripheral right upper lobe 4 mm solid pulmonary nodule (series 4/image 62). No acute consolidative airspace disease, lung masses or new significant pulmonary nodules. Musculoskeletal: No aggressive appearing focal osseous lesions. Stable post thoracotomy change in the posterior right ribs. CT ABDOMEN PELVIS FINDINGS Hepatobiliary: Normal liver size. Subcentimeter hypodense right liver lesion is too small to characterize and is stable. No new liver lesions. Normal gallbladder with no radiopaque cholelithiasis. No biliary ductal  dilatation. Pancreas: Stable coarse calcifications in the pancreatic head compatible with chronic pancreatitis. No pancreatic mass or duct dilation. Spleen: Normal size. No mass. Adrenals/Urinary Tract: Normal adrenals. Simple exophytic 5.0 cm posterior upper left renal cyst. Several subcentimeter hypodense renal cortical lesions in both kidneys are too small to characterize and are unchanged. No hydronephrosis. Collapsed bladder without acute abnormality. Stomach/Bowel: Moderate hiatal hernia. Stomach is nondistended. Mass-effect on the stomach by ascitic fluid in the lesser omentum. Normal caliber small bowel with no small bowel wall thickening. Appendix not discretely visualized. Oral contrast transits to the rectum. Normal large bowel with no diverticulosis, large bowel wall thickening or pericolonic fat stranding. Vascular/Lymphatic: Atherosclerotic nonaneurysmal abdominal aorta. Patent portal, splenic, hepatic and renal veins. No pathologically enlarged lymph nodes in the abdomen or pelvis. Reproductive: Stable heterogeneous uterus. No discrete adnexal masses. Other: Large volume ascites extending into small to moderate right inguinal hernia and small umbilical hernia, increased in volume. No pneumoperitoneum. There is diffuse irregular peritoneal thickening and hyperenhancement throughout the abdomen and pelvis, which is worsened in the interval. Representative anterior pelvic 1.7 cm implant (series 2/image 90), previously 1.2 cm on 01/31/2019 CT. Left pelvic sidewall peritoneal thickening up to 0.9 cm (series 2/image 104), previously 0.7 cm. Left upper quadrant peritoneal thickness 1.7 cm (series 2/image 59), previously 1.3 cm. Right lower quadrant peritoneal thickness 1.0 cm (series 2/image 93), previously 0.7 cm. Mild anasarca. Musculoskeletal: No aggressive appearing focal osseous lesions. Moderate lower lumbar spondylosis. IMPRESSION: 1. Acute bilateral pulmonary embolism, extending proximally to the  lobar level. 2. Positive for acute PE with CT evidence of right heart strain (RV/LV Ratio = 1.3) consistent with at least submassive (intermediate risk) PE. The presence of right heart strain has been associated with an increased risk of morbidity and mortality. 3. Mediastinal adenopathy is stable. Stable trace dependent right pleural effusion. No new or progressive metastatic disease in the chest. 4. Large volume ascites is increased. Diffuse irregular peritoneal thickening and hyperenhancement has worsened, compatible with progressive diffuse peritoneal carcinomatosis. 5. Chronic findings include: Aortic Atherosclerosis (ICD10-I70.0) and Emphysema (ICD10-J43.9). Left main and 3 vessel coronary atherosclerosis. Moderate hiatal hernia. Chronic pancreatitis. Critical Value/emergent results were called by telephone at the time of interpretation on 04/19/2019 at 3:59 pm to Dr. Heath Lark , who verbally acknowledged these results. Electronically Signed   By: Ilona Sorrel M.D.   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 Restaging. Paracentesis earlier today. EXAM: CT CHEST, ABDOMEN, AND PELVIS WITH CONTRAST TECHNIQUE: Multidetector CT imaging of the chest, abdomen and pelvis was performed following the standard protocol during bolus administration of intravenous contrast. CONTRAST:  139mL OMNIPAQUE IOHEXOL 300 MG/ML  SOLN COMPARISON:  01/31/2019 CT chest, abdomen and pelvis. FINDINGS: CT CHEST FINDINGS Cardiovascular: Normal heart size. No significant pericardial effusion/thickening. Left main and 3 vessel coronary  atherosclerosis. Right internal jugular Port-A-Cath terminates in the lower third of the SVC. Atherosclerotic nonaneurysmal thoracic aorta. Normal caliber pulmonary arteries. Bilateral acute pulmonary embolism involving lobar, segmental and subsegmental branches.  RV/LV ratio 1.3. Mediastinum/Nodes: Right hemithyroidectomy. No left thyroid nodules. Patulous thoracic esophagus filled with oral contrast. No axillary adenopathy. Mildly enlarged 1.0 cm right pericardiophrenic node, stable. Prevascular adenopathy measuring up to 1.1 cm (series 2/image 23), stable. No new pathologically enlarged mediastinal nodes. No hilar adenopathy. Lungs/Pleura: No pneumothorax. Trace dependent right pleural effusion, stable. No left pleural effusion. Moderate centrilobular emphysema. Status post right lower lobectomy. Stable peripheral right upper lobe 4 mm solid pulmonary nodule (series 4/image 62). No acute consolidative airspace disease, lung masses or new significant pulmonary nodules. Musculoskeletal: No aggressive appearing focal osseous lesions. Stable post thoracotomy change in the posterior right ribs. CT ABDOMEN PELVIS FINDINGS Hepatobiliary: Normal liver size. Subcentimeter hypodense right liver lesion is too small to characterize and is stable. No new liver lesions. Normal gallbladder with no radiopaque cholelithiasis. No biliary ductal dilatation. Pancreas: Stable coarse calcifications in the pancreatic head compatible with chronic pancreatitis. No pancreatic mass or duct dilation. Spleen: Normal size. No mass. Adrenals/Urinary Tract: Normal adrenals. Simple exophytic 5.0 cm posterior upper left renal cyst. Several subcentimeter hypodense renal cortical lesions in both kidneys are too small to characterize and are unchanged. No hydronephrosis. Collapsed bladder without acute abnormality. Stomach/Bowel: Moderate hiatal hernia. Stomach is nondistended. Mass-effect on the stomach by ascitic fluid in the lesser omentum. Normal caliber small bowel with no small bowel wall thickening. Appendix not discretely visualized. Oral contrast transits to the rectum. Normal large bowel with no diverticulosis, large bowel wall thickening or pericolonic fat stranding. Vascular/Lymphatic:  Atherosclerotic nonaneurysmal abdominal aorta. Patent portal, splenic, hepatic and renal veins. No pathologically enlarged lymph nodes in the abdomen or pelvis. Reproductive: Stable heterogeneous uterus. No discrete adnexal masses. Other: Large volume ascites extending into small to moderate right inguinal hernia and small umbilical hernia, increased in volume. No pneumoperitoneum. There is diffuse irregular peritoneal thickening and hyperenhancement throughout the abdomen and pelvis, which is worsened in the interval. Representative anterior pelvic 1.7 cm implant (series 2/image 90), previously 1.2 cm on 01/31/2019 CT. Left pelvic sidewall peritoneal thickening up to 0.9 cm (series 2/image 104), previously 0.7 cm. Left upper quadrant peritoneal thickness 1.7 cm (series 2/image 59), previously 1.3 cm. Right lower quadrant peritoneal thickness 1.0 cm (series 2/image 93), previously 0.7 cm. Mild anasarca. Musculoskeletal: No aggressive appearing focal osseous lesions. Moderate lower lumbar spondylosis. IMPRESSION: 1. Acute bilateral pulmonary embolism, extending proximally to the lobar level. 2. Positive for acute PE with CT evidence of right heart strain (RV/LV Ratio = 1.3) consistent with at least submassive (intermediate risk) PE. The presence of right heart strain has been associated with an increased risk of morbidity and mortality. 3. Mediastinal adenopathy is stable. Stable trace dependent right pleural effusion. No new or progressive metastatic disease in the chest. 4. Large volume ascites is increased. Diffuse irregular peritoneal thickening and hyperenhancement has worsened, compatible with progressive diffuse peritoneal carcinomatosis. 5. Chronic findings include: Aortic Atherosclerosis (ICD10-I70.0) and Emphysema (ICD10-J43.9). Left main and 3 vessel coronary atherosclerosis. Moderate hiatal hernia. Chronic pancreatitis. Critical Value/emergent results were called by telephone at the time of interpretation  on 04/19/2019 at 3:59 pm to Dr. Heath Lark , who verbally acknowledged these results. Electronically Signed   By: Ilona Sorrel M.D.   On: 04/19/2019 16:05   US Paracentesis  Result Date: 04/19/2019 INDICATION: Malignant ascites. EXAM: ULTRASOUND GUIDED  therapeutic PARACENTESIS MEDICATIONS: None. COMPLICATIONS: None immediate. PROCEDURE: Informed written consent was obtained from the patient after a discussion of the risks, benefits and alternatives to treatment. A timeout was performed prior to the initiation of the procedure. Initial ultrasound scanning demonstrates a large amount of ascites within the right lower abdominal quadrant. The right lower abdomen was prepped and draped in the usual sterile fashion. 1% lidocaine with epinephrine was used for local anesthesia. Following this, a paracentesis catheter was introduced. An ultrasound image was saved for documentation purposes. The paracentesis was performed. The catheter was removed and a dressing was applied. The patient tolerated the procedure well without immediate post procedural complication. FINDINGS: A total of approximately 5 L of serosanguineous fluid was removed. IMPRESSION: Successful ultrasound-guided paracentesis yielding 5 liters of peritoneal fluid. Electronically Signed   By: Marijo Conception M.D.   On: 04/19/2019 11:31    ____________________________________________   PROCEDURES  Procedure(s) performed:   Procedures  CRITICAL CARE Performed by: Margette Fast Total critical care time: 35 minutes Critical care time was exclusive of separately billable procedures and treating other patients. Critical care was necessary to treat or prevent imminent or life-threatening deterioration. Critical care was time spent personally by me on the following activities: development of treatment plan with patient and/or surrogate as well as nursing, discussions with consultants, evaluation of patient's response to treatment, examination of  patient, obtaining history from patient or surrogate, ordering and performing treatments and interventions, ordering and review of laboratory studies, ordering and review of radiographic studies, pulse oximetry and re-evaluation of patient's condition.  Nanda Quinton, MD Emergency Medicine  ____________________________________________   INITIAL IMPRESSION / ASSESSMENT AND PLAN / ED COURSE  Pertinent labs & imaging results that were available during my care of the patient were reviewed by me and considered in my medical decision making (see chart for details).   Patient presents to the emergency department with outpatient CT showing bilateral PE.  She has tachycardia but no hypoxemia.  No history of intracranial hemorrhage or GI bleeding.  Started heparin.  Will obtain screening blood work along with COVID-19 testing and will require admission with evidence of right heart strain by CT. Patient asymptomatic at rest. Oncologist is Dr. [name].   Labs reviewed.  Started heparin infusion and continue to monitor heart rate and blood pressure. Plan for admit.   Discussed patient's case with Hospitalist to request admission. Patient and family (if present) updated with plan. Care transferred to Hospitalist service.  I reviewed all nursing notes, vitals, pertinent old records, EKGs, labs, imaging (as available).  ____________________________________________  FINAL CLINICAL IMPRESSION(S) / ED DIAGNOSES  Final diagnoses:  Acute pulmonary embolism without acute cor pulmonale, unspecified pulmonary embolism type (HCC)  Dyspnea on exertion     MEDICATIONS GIVEN DURING THIS VISIT:  Medications  allopurinol (ZYLOPRIM) tablet 300 mg (has no administration in time range)  aspirin EC tablet 81 mg (has no administration in time range)  cloNIDine (CATAPRES) tablet 0.2 mg (0.2 mg Oral Given 04/20/19 0145)  atorvastatin (LIPITOR) tablet 80 mg (has no administration in time range)  levothyroxine  (SYNTHROID) tablet 100 mcg (100 mcg Oral Given 04/20/19 0514)  cholecalciferol (VITAMIN D) tablet 1,000 Units (has no administration in time range)  prochlorperazine (COMPAZINE) tablet 10 mg (has no administration in time range)  0.9 %  sodium chloride infusion ( Intravenous New Bag/Given 04/20/19 0158)  ondansetron (ZOFRAN) tablet 4 mg (has no administration in time range)    Or  ondansetron (ZOFRAN) injection  4 mg (has no administration in time range)  insulin aspart (novoLOG) injection 0-9 Units (has no administration in time range)  insulin aspart (novoLOG) injection 0-5 Units (has no administration in time range)  sodium chloride flush (NS) 0.9 % injection 10-40 mL (has no administration in time range)  feeding supplement (ENSURE ENLIVE) (ENSURE ENLIVE) liquid 237 mL (has no administration in time range)  heparin ADULT infusion 100 units/mL (25000 units/263mL sodium chloride 0.45%) (1,000 Units/hr Intravenous Restarted 04/20/19 0415)  heparin bolus via infusion 3,500 Units (3,500 Units Intravenous Bolus from Bag 04/19/19 2001)    Note:  This document was prepared using Dragon voice recognition software and may include unintentional dictation errors.  Nanda Quinton, MD Emergency Medicine    Merril Nagy, [facility], MD 04/20/19 [phone]

## 2019-04-20 ENCOUNTER — Inpatient Hospital Stay: Payer: Medicare HMO | Admitting: Hematology and Oncology

## 2019-04-20 ENCOUNTER — Inpatient Hospital Stay (HOSPITAL_COMMUNITY): Payer: Medicare HMO

## 2019-04-20 ENCOUNTER — Encounter (HOSPITAL_COMMUNITY): Payer: Self-pay

## 2019-04-20 DIAGNOSIS — R0609 Other forms of dyspnea: Secondary | ICD-10-CM

## 2019-04-20 DIAGNOSIS — C569 Malignant neoplasm of unspecified ovary: Secondary | ICD-10-CM

## 2019-04-20 DIAGNOSIS — N183 Chronic kidney disease, stage 3 (moderate): Secondary | ICD-10-CM

## 2019-04-20 DIAGNOSIS — I361 Nonrheumatic tricuspid (valve) insufficiency: Secondary | ICD-10-CM

## 2019-04-20 DIAGNOSIS — E119 Type 2 diabetes mellitus without complications: Secondary | ICD-10-CM

## 2019-04-20 DIAGNOSIS — I2699 Other pulmonary embolism without acute cor pulmonale: Secondary | ICD-10-CM

## 2019-04-20 DIAGNOSIS — I10 Essential (primary) hypertension: Secondary | ICD-10-CM

## 2019-04-20 LAB — CBC
HCT: 23.1 % — ABNORMAL LOW (ref 36.0–46.0)
Hemoglobin: 7.3 g/dL — ABNORMAL LOW (ref 12.0–15.0)
MCH: 30.7 pg (ref 26.0–34.0)
MCHC: 31.6 g/dL (ref 30.0–36.0)
MCV: 97.1 fL (ref 80.0–100.0)
Platelets: 312 10*3/uL (ref 150–400)
RBC: 2.38 MIL/uL — ABNORMAL LOW (ref 3.87–5.11)
RDW: 18 % — ABNORMAL HIGH (ref 11.5–15.5)
WBC: 5.1 10*3/uL (ref 4.0–10.5)
nRBC: 0 % (ref 0.0–0.2)

## 2019-04-20 LAB — BASIC METABOLIC PANEL
Anion gap: 7 (ref 5–15)
BUN: 31 mg/dL — ABNORMAL HIGH (ref 8–23)
CO2: 21 mmol/L — ABNORMAL LOW (ref 22–32)
Calcium: 7.9 mg/dL — ABNORMAL LOW (ref 8.9–10.3)
Chloride: 109 mmol/L (ref 98–111)
Creatinine, Ser: 1.86 mg/dL — ABNORMAL HIGH (ref 0.44–1.00)
GFR calc Af Amer: 32 mL/min — ABNORMAL LOW (ref >60)
GFR calc non Af Amer: 27 mL/min — ABNORMAL LOW (ref >60)
Glucose, Bld: 92 mg/dL (ref 70–99)
Potassium: 5.1 mmol/L (ref 3.5–5.1)
Sodium: 137 mmol/L (ref 135–145)

## 2019-04-20 LAB — GLUCOSE, CAPILLARY
Glucose-Capillary: 87 mg/dL (ref 70–99)
Glucose-Capillary: 74 mg/dL (ref 70–99)
Glucose-Capillary: 87 mg/dL (ref 70–99)
Glucose-Capillary: 90 mg/dL (ref 70–99)

## 2019-04-20 LAB — COMPREHENSIVE METABOLIC PANEL
ALT: 17 U/L (ref 0–44)
AST: 29 U/L (ref 15–41)
Albumin: 2 g/dL — ABNORMAL LOW (ref 3.5–5.0)
Alkaline Phosphatase: 93 U/L (ref 38–126)
Anion gap: 6 (ref 5–15)
BUN: 34 mg/dL — ABNORMAL HIGH (ref 8–23)
CO2: 21 mmol/L — ABNORMAL LOW (ref 22–32)
Calcium: 7.6 mg/dL — ABNORMAL LOW (ref 8.9–10.3)
Chloride: 107 mmol/L (ref 98–111)
Creatinine, Ser: 1.91 mg/dL — ABNORMAL HIGH (ref 0.44–1.00)
GFR calc Af Amer: 31 mL/min — ABNORMAL LOW (ref >60)
GFR calc non Af Amer: 26 mL/min — ABNORMAL LOW (ref >60)
Glucose, Bld: 75 mg/dL (ref 70–99)
Potassium: 5.1 mmol/L (ref 3.5–5.1)
Sodium: 134 mmol/L — ABNORMAL LOW (ref 135–145)
Total Bilirubin: 0.3 mg/dL (ref 0.3–1.2)
Total Protein: 4.5 g/dL — ABNORMAL LOW (ref 6.5–8.1)

## 2019-04-20 LAB — ECHOCARDIOGRAM COMPLETE
Height: 65 in
Weight: 2190.49 oz

## 2019-04-20 LAB — HEPARIN LEVEL (UNFRACTIONATED)
Heparin Unfractionated: 1.12 IU/mL — ABNORMAL HIGH (ref 0.30–0.70)
Heparin Unfractionated: 1 IU/mL — ABNORMAL HIGH (ref 0.30–0.70)
Heparin Unfractionated: 0.51 IU/mL (ref 0.30–0.70)

## 2019-04-20 LAB — RETICULOCYTES
Immature Retic Fract: 33 % — ABNORMAL HIGH (ref 2.3–15.9)
RBC.: 2.36 MIL/uL — ABNORMAL LOW (ref 3.87–5.11)
Retic Count, Absolute: 77.6 10*3/uL (ref 19.0–186.0)
Retic Ct Pct: 3.3 % — ABNORMAL HIGH (ref 0.4–3.1)

## 2019-04-20 LAB — HEMOGLOBIN A1C
Hgb A1c MFr Bld: 5.9 % — ABNORMAL HIGH (ref 4.8–5.6)
Mean Plasma Glucose: 122.63 mg/dL

## 2019-04-20 LAB — IRON AND TIBC
Iron: 29 ug/dL (ref 28–170)
Saturation Ratios: 22 % (ref 10.4–31.8)
TIBC: 130 ug/dL — ABNORMAL LOW (ref 250–450)
UIBC: 101 ug/dL

## 2019-04-20 LAB — FERRITIN: Ferritin: 354 ng/mL — ABNORMAL HIGH (ref 11–307)

## 2019-04-20 LAB — FOLATE: Folate: 19.7 ng/mL (ref >5.9)

## 2019-04-20 LAB — TROPONIN I (HIGH SENSITIVITY): Troponin I (High Sensitivity): 13 ng/L (ref <18)

## 2019-04-20 LAB — VITAMIN B12: Vitamin B-12: 329 pg/mL (ref 180–914)

## 2019-04-20 LAB — HEMOGLOBIN AND HEMATOCRIT, BLOOD
HCT: 22.8 % — ABNORMAL LOW (ref 36.0–46.0)
Hemoglobin: 7.2 g/dL — ABNORMAL LOW (ref 12.0–15.0)

## 2019-04-20 MED ORDER — HEPARIN (PORCINE) 25000 UT/250ML-% IV SOLN
700.0000 [IU]/h | INTRAVENOUS | Status: DC
  Administered 2019-04-20 – 2019-04-22 (×2): 700 [IU]/h via INTRAVENOUS
  Filled 2019-04-20 (×2): qty 250

## 2019-04-20 MED ORDER — SODIUM CHLORIDE 0.9% FLUSH: 10.0000 mL | INTRAVENOUS | Status: DC | PRN

## 2019-04-20 MED ORDER — SODIUM CHLORIDE 0.9 % IV SOLN
INTRAVENOUS | Status: DC
  Administered 2019-04-20 – 2019-04-21 (×3): via INTRAVENOUS

## 2019-04-20 MED ORDER — PERFLUTREN LIPID MICROSPHERE
1.0000 mL | INTRAVENOUS | Status: AC | PRN
  Administered 2019-04-20: 2 mL via INTRAVENOUS
  Filled 2019-04-20: qty 10

## 2019-04-20 MED ORDER — CLONIDINE HCL 0.2 MG PO TABS
0.2000 mg | ORAL_TABLET | Freq: Two times a day (BID) | ORAL | Status: DC
  Administered 2019-04-20 – 2019-04-23 (×8): 0.2 mg via ORAL
  Filled 2019-04-20 (×8): qty 1

## 2019-04-20 MED ORDER — ONDANSETRON HCL 4 MG/2ML IJ SOLN
4.0000 mg | Freq: Four times a day (QID) | INTRAMUSCULAR | Status: DC | PRN
  Filled 2019-04-20: qty 2

## 2019-04-20 MED ORDER — INSULIN ASPART 100 UNIT/ML ~~LOC~~ SOLN: 0.0000 [IU] | Freq: Three times a day (TID) | SUBCUTANEOUS | Status: DC

## 2019-04-20 MED ORDER — VITAMIN D3 25 MCG (1000 UNIT) PO TABS
1000.0000 [IU] | ORAL_TABLET | Freq: Every day | ORAL | Status: DC
  Administered 2019-04-20 – 2019-04-23 (×4): 1000 [IU] via ORAL
  Filled 2019-04-20 (×4): qty 1

## 2019-04-20 MED ORDER — ONDANSETRON HCL 4 MG PO TABS: 8.0000 mg | ORAL_TABLET | Freq: Three times a day (TID) | ORAL | Status: DC | PRN

## 2019-04-20 MED ORDER — HEPARIN (PORCINE) 25000 UT/250ML-% IV SOLN: 1000.0000 [IU]/h | INTRAVENOUS | Status: DC

## 2019-04-20 MED ORDER — ONDANSETRON HCL 4 MG PO TABS: 4.0000 mg | ORAL_TABLET | Freq: Four times a day (QID) | ORAL | Status: DC | PRN

## 2019-04-20 MED ORDER — PROCHLORPERAZINE MALEATE 10 MG PO TABS: 10.0000 mg | ORAL_TABLET | Freq: Four times a day (QID) | ORAL | Status: DC | PRN

## 2019-04-20 MED ORDER — ALLOPURINOL 300 MG PO TABS
300.0000 mg | ORAL_TABLET | Freq: Every day | ORAL | Status: DC
  Administered 2019-04-20 – 2019-04-23 (×4): 300 mg via ORAL
  Filled 2019-04-20 (×4): qty 1

## 2019-04-20 MED ORDER — INSULIN ASPART 100 UNIT/ML ~~LOC~~ SOLN: 0.0000 [IU] | Freq: Every day | SUBCUTANEOUS | Status: DC

## 2019-04-20 MED ORDER — ATORVASTATIN CALCIUM 40 MG PO TABS
80.0000 mg | ORAL_TABLET | Freq: Every day | ORAL | Status: DC
  Administered 2019-04-20 – 2019-04-23 (×4): 80 mg via ORAL
  Filled 2019-04-20 (×4): qty 2

## 2019-04-20 MED ORDER — LEVOTHYROXINE SODIUM 100 MCG PO TABS
100.0000 ug | ORAL_TABLET | Freq: Every day | ORAL | Status: DC
  Administered 2019-04-20 – 2019-04-22 (×3): 100 ug via ORAL
  Filled 2019-04-20 (×3): qty 1

## 2019-04-20 MED ORDER — ASPIRIN EC 81 MG PO TBEC
81.0000 mg | DELAYED_RELEASE_TABLET | Freq: Every day | ORAL | Status: DC
  Administered 2019-04-20 – 2019-04-23 (×4): 81 mg via ORAL
  Filled 2019-04-20 (×4): qty 1

## 2019-04-20 MED ORDER — ENSURE ENLIVE PO LIQD
237.0000 mL | Freq: Two times a day (BID) | ORAL | Status: DC
  Administered 2019-04-20 – 2019-04-23 (×5): 237 mL via ORAL

## 2019-04-20 NOTE — Progress Notes (Signed)
ANTICOAGULATION CONSULT NOTE - Initial Consult  Pharmacy Consult for IV heparin Indication: pulmonary embolus  No Known Allergies  Patient Measurements: Height: 5\' 5"  (165.1 cm) Weight: 136 lb 14.5 oz (62.1 kg) IBW/kg (Calculated) : 57  Height: 5'5" Weight: 62.1 kg Heparin Dosing Weight = TBW = 62 kg  Vital Signs: Temp: 99.1 F (37.3 C) (08/14 0049) Temp Source: Oral (08/14 0049) BP: 120/100 (08/14 0049) Pulse Rate: 96 (08/14 0049)  Labs: Recent Labs    04/19/19 2000 04/20/19 0202  HGB 11.7*  --   HCT 37.5  --   PLT 282  --   LABPROT 12.7  --   INR 1.0  --   HEPARINUNFRC  --  1.12*  CREATININE 2.14*  --   TROPONINIHS 13 13    Estimated Creatinine Clearance: 22.6 mL/min (A) (by C-G formula based on SCr of 2.14 mg/dL (H)).   Medical History: Past Medical History:  Diagnosis Date  . Chronic gouty arthritis   . Diabetes mellitus without complication (HCC)    Type 2  . Hyperlipidemia   . Hypertension   . Lung cancer ([place]) 2007   History of Non Small Cell Lung Cancer  . Thyroid disease    Hypothyroid    Assessment: 83 y/oF with PMH of ovarian cancer admitted with bilateral PE with CT evidence of right heart strain. Pharmacy consulted for IV heparin dosing. Baseline labs ordered and are pending. Patient not on any anticoagulants PTA.   Baseline labs: Hgb: 11.7 Plt: 282 INR: 1  Today, 04/20/19  HL = 1.12 is supratherapeutic on heparin infusion of 1150 units/hr.   Confirmed lab draw with RN - heparin infusing through patient port. Heparin level obtained from left AC.  RN reports heparin infusing at correct rate. No issues. No signs/symptoms of bleeding.   Goal of Therapy:  Heparin level 0.3-0.7 units/ml Monitor platelets by anticoagulation protocol: Yes   Plan:   Hold heparin for 1 hour per protocol  Resume heparin at reduced rate of 1000 units/hr  Check HL in 8 hours  CBC and HL daily  Monitor for signs/symptoms of bleeding  Follow for  transition to oral anticoagulation  Lenis Noon, PharmD 04/20/19 3:16 AM

## 2019-04-20 NOTE — Progress Notes (Signed)
[place] for IV heparin Indication: pulmonary embolus  No Known Allergies  Patient Measurements: Height: 5\' 5"  (165.1 cm) Weight: 136 lb 14.5 oz (62.1 kg) IBW/kg (Calculated) : 57  Height: 5'5" Weight: 62.1 kg Heparin Dosing Weight = TBW = 62 kg  Vital Signs: Temp: 98.1 F (36.7 C) (08/14 1259) Temp Source: Oral (08/14 1259) BP: 107/62 (08/14 1259) Pulse Rate: 74 (08/14 1259)  Labs: Recent Labs    04/19/19 2000 04/20/19 0202 04/20/19 0515 04/20/19 1136  HGB 11.7*  --   --  7.3*  HCT 37.5  --   --  23.1*  PLT 282  --   --  312  LABPROT 12.7  --   --   --   INR 1.0  --   --   --   HEPARINUNFRC  --  1.12*  --  1.00*  CREATININE 2.14*  --  1.91* 1.86*  TROPONINIHS 13 13  --   --     Estimated Creatinine Clearance: 26 mL/min (A) (by C-G formula based on SCr of 1.86 mg/dL (H)).  Assessment: 53 y/oF with PMH of ovarian cancer admitted with bilateral PE with CT evidence of right heart strain. Pharmacy consulted for IV heparin dosing. Baseline labs ordered and are pending. Patient not on any anticoagulants PTA.   Baseline labs: Hgb: 11.7 Plt: 282 INR: 1  Today, 04/20/19  CBC: Hgb dropped significantly overnight; no bleeding noted per RN but awaiting f/u from MD; Plt stable WNL  Most recent heparin level still SUPRAtherapeutic and minimally improved after decreasing heparin infusion rate to 1000 units/hr; confirmed lab draw with RN - heparin infusing through patient port and heparin level obtained from arm  No line issues per RN  Goal of Therapy:  Heparin level 0.3-0.7 units/ml Monitor platelets by anticoagulation protocol: Yes   Plan:   Hold heparin for 1 hour  Resume heparin at reduced rate of 700 units/hr  Check HL in 8 hours  CBC and HL daily  Monitor for signs/symptoms of bleeding  Follow for transition to oral anticoagulation  Reuel Boom, PharmD, BCPS [phone] 04/20/2019, 1:08 PM

## 2019-04-20 NOTE — Progress Notes (Signed)
Bilateral lower extremity venous duplex completed. Refer to "CV Proc" under chart review to view preliminary results.  04/20/2019 4:02 PM Maudry Mayhew, MHA, RVT, RDCS, RDMS

## 2019-04-20 NOTE — Progress Notes (Addendum)
Echocardiogram 2D Echocardiogram with Definity has been performed.  04/20/2019 11:01 AM Maudry Mayhew, MHA, RVT, RDCS, RDMS

## 2019-04-20 NOTE — Progress Notes (Addendum)
Christy Ellis OFFICE PROGRESS NOTE I have seen the patient, examined her, edited the documentation and also discussed with her son, Christy Ellis over the telephone Patient Care Team: Christy Bis, MD as PCP - General (Unknown Physician Specialty)  ASSESSMENT & PLAN:  Bilateral pulmonary emboli Acute bilateral pulmonary emboli with evidence of right heart strain noted on restaging CT scan of the chest Patient has been started on heparin with some improvement in her shortness of breath Continue heparin, dosing per pharmacy We will need to transition her to other anticoagulation such as Lovenox vs. DOAC Due to her background, I felt that Eliquis might be a good choice for her in the long-term I recommend only transitioning her to Eliquis once that she had drainage catheter placed in her abdomen  Ovarian cancer ([name]) I am very concerned about the recurrent ascites Clinically, she is not improving Chemotherapy was held her last visit and restaging CT scans were ordered  CT scans show stable mediastinal adenopathy, small trace dependent right pleural effusion without progressive metastatic disease in the chest.  She has large volume ascites which has increased and diffuse irregular peritoneal thickening and hyperenhancement which has worsened.  This is compatible with progressive diffuse peritoneal carcinomatosis. CA125 is rising I have a long discussion with the patient and her son The patient has platinum refractory disease She has lost a lot of weight and overall has decline in performance status We discussed palliative care/hospice and discontinuation of chemotherapy She agreed with the plan of care  Malignant ascites She has recurrent malignant ascites requiring almost weekly paracentesis She continues to have a distended abdomen despite paracentesis on 04/19/2019 We discussed the risk and benefits of placement of drainage catheter and she agreed to proceed We will also consult  palliative care/hospice at home to help manage drainage catheter upon discharge  CKD (chronic kidney disease), stage III ([name]) She has intermittent acute on chronic renal failure With discontinuation of ACE inhibitor, her renal function improved with normalization of her potassium level Renal function had worsened and she had mild hyperkalemia on admission Metformin has been placed on hold We will monitor her kidney function carefully Agree with IV fluids, but this may worsen her ascites and will need to monitor this closely  Pleural effusion on right Clinically, she has reduced breath sounds on the right lung I am concerned about recurrent pleural effusion CT chest shows a trace dependent right pleural effusion Will monitor  Anemia Secondary to anemia chronic illness and chronic kidney disease Consider transfusion support over the weekend  CODE STATUS I discussed the CODE STATUS with the patient She agreed for DNR We discussed poor prognosis today, she will likely have less than 3 months with her current clinical condition  Discharge planning Hopefully this weekend or early next week, pending placement of catheter Plan of care is fully discussed with the patient and her son and also primary service Please call if questions arise If she is still here next week, I will return to check on her  INTERVAL HISTORY: The patient presents to the emergency room with shortness of breath CT chest showed bilateral PE and she was started on heparin Reports that her breathing is somewhat improved this morning Denies chest discomfort and bleeding Underwent ultrasound-guided paracentesis yesterday with 5 L of peritoneal fluid removed (paracentesis performed prior to CT scan)  SUMMARY OF ONCOLOGIC HISTORY: Oncology History Overview Note  MMR normal NTRK normal   Ovarian cancer ([name])  08/25/2018 Initial Diagnosis  Initially presented with nausea, vomiting and abdominal distension    08/25/2018 Imaging   CT scan demonstrated right pleural thickening and a cardiophrenic lymph node measuring 1.5 cm.  The liver demonstrated mild nodularity and scalloping of the right lobe.  There were 2 less than 1 cm lesions seen within the liver.  There was large volume ascites and a 13 cm upper abdominal left upper quadrant omental cake visualized.  There was extensive peritoneal thickening along the abdomen and pelvic peritoneum.  There is a 1.5 cm aortocaval lymph node identified.  No pelvic adenopathy was identified.  In the pelvis the left adnexa revealed a 4.6 x 3.2 cm ill-defined solid and cystic structure.  No other large dominant pelvic masses identified.  A moderate hiatal hernia was seen and a small umbilical hernia containing fat was seen.   08/29/2018 Tumor Marker   Patient's tumor was tested for the following markers: CA-125. Results of the tumor marker test revealed 1060   09/15/2018 Cancer Staging   Staging form: Ovary, Fallopian Tube, and Primary Peritoneal Carcinoma, AJCC 8th Edition - Clinical: Stage IV (cT3c, cN1, cM1) - Signed by Heath Lark, MD on 09/15/2018   09/15/2018 Tumor Marker   Patient's tumor was tested for the following markers: CA-125. Results of the tumor marker test revealed 1491   09/25/2018 Pathology Results   Omentum, biopsy - HIGH GRADE CARCINOMA, CONSISTENT WITH PAPILLARY SEROUS CARCINOMA. - SEE COMMENT. Microscopic Comment The malignant cells are positive for cytokeratin 7, estrogen receptor, p53, pax-8, and wt-1. They are negative for cytokeratin 20 and CDX-2. The histology, in conjunction with this immunohistochemical profile, supports the above diagnosis. Dr. Vicente Males has reviewed the case and concurs with this interpretation.    09/25/2018 Procedure   Successful 8 French right internal jugular vein power port placement with its tip at the SVC/RA junction.    09/26/2018 Imaging   1. Examination is positive for nodal metastasis within the  anterior and posterior mediastinum as well as right CP angle. 2. There is a small amount of right pleural fluid and pleural soft tissue nodularity which may reflect pleural involvement by tumor. 3. Large volume of ascites with evidence of peritoneal carcinomatosis with omental caking. 4. Aortic Atherosclerosis (ICD10-I70.0) and Emphysema (ICD10-J43.9). 5. Multi vessel coronary artery atherosclerotic calcifications including left main disease    Genetic Testing   Patient has genetic testing done for MMR. Results revealed MMR - normal.   09/29/2018 -  Chemotherapy   The patient had carboplatin and taxol   09/29/2018 -  Chemotherapy   The patient had carboplatin and taxol for chemo   10/20/2018 Tumor Marker   Patient's tumor was tested for the following markers: CA-125. Results of the tumor marker test revealed 2146   10/25/2018 Procedure   Successful ultrasound-guided therapeutic paracentesis yielding 5 liters of peritoneal fluid.    11/14/2018 Procedure   Successful ultrasound-guided paracentesis yielding 2.2 liters of peritoneal fluid.   11/30/2018 Imaging   IMPRESSION: 1. Significant decrease in peritoneal carcinomatosis since previous study. Ascites has mildly increased. 2. Stable left adnexal mass. 3. Stable mild mediastinal lymphadenopathy. 4. No new or progressive metastatic disease identified. 5. Small to moderate hiatal hernia. Small paraumbilical and right inguinal hernias. 6. Colonic diverticulosis, without radiographic evidence of diverticulitis.   12/01/2018 Tumor Marker   Patient's tumor was tested for the following markers: CA-125. Results of the tumor marker test revealed 527   12/25/2018 Tumor Marker   Patient's tumor was tested for the following markers: CA-125.  Results of the tumor marker test revealed 561   12/27/2018 Procedure   Successful ultrasound-guided paracentesis yielding 3.3 L of peritoneal fluid.   01/15/2019 Tumor Marker   Patient's tumor was tested  for the following markers: CA-125. Results of the tumor marker test revealed 555   01/31/2019 Procedure   Successful ultrasound-guided paracentesis yielding 3.6 L of peritoneal fluid.   01/31/2019 Imaging   CT CHEST IMPRESSION  1. Response to therapy within the chest. Stable and decreased size of thoracic nodes as detailed above. 2. Status post at least partial right lower lobectomy. Minimal right upper lobe nodularity is unchanged. 3. Similar tiny right pleural effusion. Necrotic node versus loculated pleural fluid about the inferior medial right chest, similar. 4. Aortic atherosclerosis (ICD10-I70.0), coronary artery atherosclerosis and emphysema (ICD10-J43.9).  CT ABDOMEN AND PELVIS IMPRESSION  1. Similar omental/peritoneal metastasis, with decrease in small volume anterior loculated ascites. No obstruction or other acute complication. 2. Decreased size of left ovarian/adnexal mass. 3. Similar and decreased size of low-density left pelvic sidewall lesions. The left common iliac lesion is suspicious for a lymph node. The external iliac lesion may represent a postoperative seroma, given fluid density. 4. Uterine fibroids. 5. Probable right nephrolithiasis. 6. Probable chronic calcific pancreatitis, similar.   02/09/2019 -  Chemotherapy   The patient had carboplatin and gemzar for chemotherapy treatment.    02/09/2019 Tumor Marker   Patient's tumor was tested for the following markers: CA-125. Results of the tumor marker test revealed 199   03/07/2019 Tumor Marker   Patient's tumor was tested for the following markers: CA-125 Results of the tumor marker test revealed 1274   03/07/2019 Procedure   Successful ultrasound-guided therapeutic paracentesis yielding 5 liters of peritoneal fluid. Ordering MD made aware of fluid color   03/14/2019 Procedure   Successful ultrasound-guided paracentesis yielding 5.0 liters of peritoneal fluid   04/06/2019 Procedure   Successful ultrasound-guided  therapeutic paracentesis yielding 5 liters of peritoneal fluid. Dr. [name] was notified of above findings   04/06/2019 Tumor Marker   Patient's tumor was tested for the following markers: CA-125 Results of the tumor marker test revealed 2200   04/19/2019 Procedure   Successful ultrasound-guided paracentesis yielding 5 liters of peritoneal fluid.       REVIEW OF SYSTEMS:   Constitutional: Denies fevers, chills or abnormal weight loss Eyes: Denies blurriness of vision Ears, nose, mouth, throat, and face: Denies mucositis or sore throat Respiratory: Reports shortness of breath which has improved since admission.  Denies cough Cardiovascular: Denies palpitation, chest discomfort or lower extremity swelling Gastrointestinal:  Denies nausea, heartburn or change in bowel habits Skin: Denies abnormal skin rashes Lymphatics: Denies new lymphadenopathy or easy bruising Neurological:Denies numbness, tingling or new weaknesses Behavioral/Psych: Mood is stable, no new changes  All other systems were reviewed with the patient and are negative.  I have reviewed the past medical history, past surgical history, social history and family history with the patient and they are unchanged from previous note.  ALLERGIES:  has No Known Allergies.  MEDICATIONS:  Current Facility-Administered Medications  Medication Dose Route Frequency Provider Last Rate Last Dose  . 0.9 %  sodium chloride infusion   Intravenous Continuous Elwyn Reach, MD 75 mL/hr at 04/20/19 0158    . allopurinol (ZYLOPRIM) tablet 300 mg  300 mg Oral Daily Gala Romney L, MD      . aspirin EC tablet 81 mg  81 mg Oral Daily Elwyn Reach, MD      . atorvastatin (  LIPITOR) tablet 80 mg  80 mg Oral Daily Gala Romney L, MD      . cholecalciferol (VITAMIN D) tablet 1,000 Units  1,000 Units Oral Daily Garba, Mohammad L, MD      . cloNIDine (CATAPRES) tablet 0.2 mg  0.2 mg Oral BID Gala Romney L, MD   0.2 mg at 04/20/19 0145   . feeding supplement (ENSURE ENLIVE) (ENSURE ENLIVE) liquid 237 mL  237 mL Oral BID BM Garba, Mohammad L, MD      . heparin ADULT infusion 100 units/mL (25000 units/215m sodium chloride 0.45%)  1,000 Units/hr Intravenous Continuous SLenis Noon RPH 10 mL/hr at 04/20/19 0415 1,000 Units/hr at 04/20/19 0415  . insulin aspart (novoLOG) injection 0-5 Units  0-5 Units Subcutaneous QHS Garba, Mohammad L, MD      . insulin aspart (novoLOG) injection 0-9 Units  0-9 Units Subcutaneous TID WC Garba, Mohammad L, MD      . levothyroxine (SYNTHROID) tablet 100 mcg  100 mcg Oral QAC breakfast GElwyn Reach MD   100 mcg at 04/20/19 0514  . ondansetron (ZOFRAN) tablet 4 mg  4 mg Oral Q6H PRN GElwyn Reach MD       Or  . ondansetron (ZOFRAN) injection 4 mg  4 mg Intravenous Q6H PRN GElwyn Reach MD      . prochlorperazine (COMPAZINE) tablet 10 mg  10 mg Oral Q6H PRN GJonelle Sidle Mohammad L, MD      . sodium chloride flush (NS) 0.9 % injection 10-40 mL  10-40 mL Intracatheter PRN GElwyn Reach MD        PHYSICAL EXAMINATION: ECOG PERFORMANCE STATUS: 2 - Symptomatic, <50% confined to bed  Vitals:   04/20/19 0049 04/20/19 0608  BP: (!) 120/100 (!) 103/57  Pulse: 96 77  Resp: 18 18  Temp: 99.1 F (37.3 C) 98.7 F (37.1 C)  SpO2: 97% 99%   Filed Weights   04/20/19 0049  Weight: 136 lb 14.5 oz (62.1 kg)    GENERAL:alert, no distress and comfortable.  She has signs of temporal muscle wasting SKIN: skin color, texture, turgor are normal, no rashes or significant lesions EYES: normal, Conjunctiva are pink and non-injected, sclera clear OROPHARYNX:no exudate, no erythema and lips, buccal mucosa, and tongue normal  NECK: supple, thyroid normal size, non-tender, without nodularity LYMPH:  no palpable lymphadenopathy in the cervical, axillary or inguinal LUNGS: Normal breathing effort with reduced breath sounds on the right lung base HEART: regular rate & rhythm and no murmurs and no lower  extremity edema ABDOMEN:abdomen soft, grossly distended with ascites Musculoskeletal:no cyanosis of digits and no clubbing  NEURO: alert & oriented x 3 with fluent speech, no focal motor/sensory deficits  LABORATORY DATA:  I have reviewed the data as listed    Component Value Date/Time   NA 134 (L) 04/19/2019 2000   K 5.2 (H) 04/19/2019 2000   CL 106 04/19/2019 2000   CO2 17 (L) 04/19/2019 2000   GLUCOSE 102 (H) 04/19/2019 2000   BUN 36 (H) 04/19/2019 2000   CREATININE 2.14 (H) 04/19/2019 2000   CREATININE 1.30 (H) 04/06/2019 1011   CALCIUM 8.1 (L) 04/19/2019 2000   PROT 5.1 (L) 04/19/2019 2000   ALBUMIN 2.4 (L) 04/19/2019 2000   AST 37 04/19/2019 2000   AST 32 04/06/2019 1011   ALT 21 04/19/2019 2000   ALT 18 04/06/2019 1011   ALKPHOS 110 04/19/2019 2000   BILITOT 0.2 (L) 04/19/2019 2000   BILITOT 0.3 04/06/2019 1011  GFRNONAA 23 (L) 04/19/2019 2000   GFRNONAA 42 (L) 04/06/2019 1011   GFRAA 27 (L) 04/19/2019 2000   GFRAA 49 (L) 04/06/2019 1011    No results found for: SPEP, UPEP  Lab Results  Component Value Date   WBC 3.5 (L) 04/19/2019   NEUTROABS 2.5 04/19/2019   HGB 11.7 (L) 04/19/2019   HCT 37.5 04/19/2019   MCV 94.9 04/19/2019   PLT 282 04/19/2019      Chemistry      Component Value Date/Time   NA 134 (L) 04/19/2019 2000   K 5.2 (H) 04/19/2019 2000   CL 106 04/19/2019 2000   CO2 17 (L) 04/19/2019 2000   BUN 36 (H) 04/19/2019 2000   CREATININE 2.14 (H) 04/19/2019 2000   CREATININE 1.30 (H) 04/06/2019 1011      Component Value Date/Time   CALCIUM 8.1 (L) 04/19/2019 2000   ALKPHOS 110 04/19/2019 2000   AST 37 04/19/2019 2000   AST 32 04/06/2019 1011   ALT 21 04/19/2019 2000   ALT 18 04/06/2019 1011   BILITOT 0.2 (L) 04/19/2019 2000   BILITOT 0.3 04/06/2019 1011       RADIOGRAPHIC STUDIES: I have personally reviewed the radiological images as listed and agreed with the findings in the report. 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 Restaging. Paracentesis earlier today. EXAM: CT CHEST, ABDOMEN, AND PELVIS WITH CONTRAST TECHNIQUE: Multidetector CT imaging of the chest, abdomen and pelvis was performed following the standard protocol during bolus administration of intravenous contrast. CONTRAST:  119m OMNIPAQUE IOHEXOL 300 MG/ML  SOLN COMPARISON:  01/31/2019 CT chest, abdomen and pelvis. FINDINGS: CT CHEST FINDINGS Cardiovascular: Normal heart size. No significant pericardial effusion/thickening. Left main and 3 vessel coronary atherosclerosis. Right internal jugular Port-A-Cath terminates in the lower third of the SVC. Atherosclerotic nonaneurysmal thoracic aorta. Normal caliber pulmonary arteries. Bilateral acute pulmonary embolism involving lobar, segmental and subsegmental branches. RV/LV ratio 1.3. Mediastinum/Nodes: Right hemithyroidectomy. No left thyroid nodules. Patulous thoracic esophagus filled with oral contrast. No axillary adenopathy. Mildly enlarged 1.0 cm right pericardiophrenic node, stable. Prevascular adenopathy measuring up to 1.1 cm (series 2/image 23), stable. No new pathologically enlarged mediastinal nodes. No hilar adenopathy. Lungs/Pleura: No pneumothorax. Trace dependent right pleural effusion, stable. No left pleural effusion. Moderate centrilobular emphysema. Status post right lower lobectomy. Stable peripheral right upper lobe 4 mm solid pulmonary nodule (series 4/image 62). No acute consolidative airspace disease, lung masses or new significant pulmonary nodules. Musculoskeletal: No aggressive appearing focal osseous lesions. Stable post thoracotomy change in the posterior right ribs. CT ABDOMEN PELVIS FINDINGS Hepatobiliary: Normal liver size. Subcentimeter hypodense right liver lesion is too small to characterize and is stable. No new liver lesions. Normal gallbladder with no radiopaque  cholelithiasis. No biliary ductal dilatation. Pancreas: Stable coarse calcifications in the pancreatic head compatible with chronic pancreatitis. No pancreatic mass or duct dilation. Spleen: Normal size. No mass. Adrenals/Urinary Tract: Normal adrenals. Simple exophytic 5.0 cm posterior upper left renal cyst. Several subcentimeter hypodense renal cortical lesions in both kidneys are too small to characterize and are unchanged. No hydronephrosis. Collapsed bladder without acute abnormality. Stomach/Bowel: Moderate hiatal hernia. Stomach is nondistended. Mass-effect on the stomach by ascitic fluid in the lesser omentum. Normal caliber small bowel with no small bowel wall thickening. Appendix not discretely visualized. Oral contrast transits to the rectum. Normal large bowel with no diverticulosis, large bowel wall thickening or pericolonic fat  stranding. Vascular/Lymphatic: Atherosclerotic nonaneurysmal abdominal aorta. Patent portal, splenic, hepatic and renal veins. No pathologically enlarged lymph nodes in the abdomen or pelvis. Reproductive: Stable heterogeneous uterus. No discrete adnexal masses. Other: Large volume ascites extending into small to moderate right inguinal hernia and small umbilical hernia, increased in volume. No pneumoperitoneum. There is diffuse irregular peritoneal thickening and hyperenhancement throughout the abdomen and pelvis, which is worsened in the interval. Representative anterior pelvic 1.7 cm implant (series 2/image 90), previously 1.2 cm on 01/31/2019 CT. Left pelvic sidewall peritoneal thickening up to 0.9 cm (series 2/image 104), previously 0.7 cm. Left upper quadrant peritoneal thickness 1.7 cm (series 2/image 59), previously 1.3 cm. Right lower quadrant peritoneal thickness 1.0 cm (series 2/image 93), previously 0.7 cm. Mild anasarca. Musculoskeletal: No aggressive appearing focal osseous lesions. Moderate lower lumbar spondylosis. IMPRESSION: 1. Acute bilateral pulmonary  embolism, extending proximally to the lobar level. 2. Positive for acute PE with CT evidence of right heart strain (RV/LV Ratio = 1.3) consistent with at least submassive (intermediate risk) PE. The presence of right heart strain has been associated with an increased risk of morbidity and mortality. 3. Mediastinal adenopathy is stable. Stable trace dependent right pleural effusion. No new or progressive metastatic disease in the chest. 4. Large volume ascites is increased. Diffuse irregular peritoneal thickening and hyperenhancement has worsened, compatible with progressive diffuse peritoneal carcinomatosis. 5. Chronic findings include: Aortic Atherosclerosis (ICD10-I70.0) and Emphysema (ICD10-J43.9). Left main and 3 vessel coronary atherosclerosis. Moderate hiatal hernia. Chronic pancreatitis. Critical Value/emergent results were called by telephone at the time of interpretation on 04/19/2019 at 3:59 pm to Dr. Heath Lark , who verbally acknowledged these results. Electronically Signed   By: Ilona Sorrel M.D.   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 Restaging. Paracentesis earlier today. EXAM: CT CHEST, ABDOMEN, AND PELVIS WITH CONTRAST TECHNIQUE: Multidetector CT imaging of the chest, abdomen and pelvis was performed following the standard protocol during bolus administration of intravenous contrast. CONTRAST:  186m OMNIPAQUE IOHEXOL 300 MG/ML  SOLN COMPARISON:  01/31/2019 CT chest, abdomen and pelvis. FINDINGS: CT CHEST FINDINGS Cardiovascular: Normal heart size. No significant pericardial effusion/thickening. Left main and 3 vessel coronary atherosclerosis. Right internal jugular Port-A-Cath terminates in the lower third of the SVC. Atherosclerotic nonaneurysmal thoracic aorta. Normal caliber pulmonary arteries. Bilateral acute pulmonary embolism involving lobar,  segmental and subsegmental branches. RV/LV ratio 1.3. Mediastinum/Nodes: Right hemithyroidectomy. No left thyroid nodules. Patulous thoracic esophagus filled with oral contrast. No axillary adenopathy. Mildly enlarged 1.0 cm right pericardiophrenic node, stable. Prevascular adenopathy measuring up to 1.1 cm (series 2/image 23), stable. No new pathologically enlarged mediastinal nodes. No hilar adenopathy. Lungs/Pleura: No pneumothorax. Trace dependent right pleural effusion, stable. No left pleural effusion. Moderate centrilobular emphysema. Status post right lower lobectomy. Stable peripheral right upper lobe 4 mm solid pulmonary nodule (series 4/image 62). No acute consolidative airspace disease, lung masses or new significant pulmonary nodules. Musculoskeletal: No aggressive appearing focal osseous lesions. Stable post thoracotomy change in the posterior right ribs. CT ABDOMEN PELVIS FINDINGS Hepatobiliary: Normal liver size. Subcentimeter hypodense right liver lesion is too small to characterize and is stable. No new liver lesions. Normal gallbladder with no radiopaque cholelithiasis. No biliary ductal dilatation. Pancreas: Stable coarse calcifications in the pancreatic head compatible with chronic pancreatitis. No pancreatic mass or duct dilation. Spleen: Normal size. No mass. Adrenals/Urinary Tract: Normal adrenals. Simple exophytic 5.0 cm  posterior upper left renal cyst. Several subcentimeter hypodense renal cortical lesions in both kidneys are too small to characterize and are unchanged. No hydronephrosis. Collapsed bladder without acute abnormality. Stomach/Bowel: Moderate hiatal hernia. Stomach is nondistended. Mass-effect on the stomach by ascitic fluid in the lesser omentum. Normal caliber small bowel with no small bowel wall thickening. Appendix not discretely visualized. Oral contrast transits to the rectum. Normal large bowel with no diverticulosis, large bowel wall thickening or pericolonic fat  stranding. Vascular/Lymphatic: Atherosclerotic nonaneurysmal abdominal aorta. Patent portal, splenic, hepatic and renal veins. No pathologically enlarged lymph nodes in the abdomen or pelvis. Reproductive: Stable heterogeneous uterus. No discrete adnexal masses. Other: Large volume ascites extending into small to moderate right inguinal hernia and small umbilical hernia, increased in volume. No pneumoperitoneum. There is diffuse irregular peritoneal thickening and hyperenhancement throughout the abdomen and pelvis, which is worsened in the interval. Representative anterior pelvic 1.7 cm implant (series 2/image 90), previously 1.2 cm on 01/31/2019 CT. Left pelvic sidewall peritoneal thickening up to 0.9 cm (series 2/image 104), previously 0.7 cm. Left upper quadrant peritoneal thickness 1.7 cm (series 2/image 59), previously 1.3 cm. Right lower quadrant peritoneal thickness 1.0 cm (series 2/image 93), previously 0.7 cm. Mild anasarca. Musculoskeletal: No aggressive appearing focal osseous lesions. Moderate lower lumbar spondylosis. IMPRESSION: 1. Acute bilateral pulmonary embolism, extending proximally to the lobar level. 2. Positive for acute PE with CT evidence of right heart strain (RV/LV Ratio = 1.3) consistent with at least submassive (intermediate risk) PE. The presence of right heart strain has been associated with an increased risk of morbidity and mortality. 3. Mediastinal adenopathy is stable. Stable trace dependent right pleural effusion. No new or progressive metastatic disease in the chest. 4. Large volume ascites is increased. Diffuse irregular peritoneal thickening and hyperenhancement has worsened, compatible with progressive diffuse peritoneal carcinomatosis. 5. Chronic findings include: Aortic Atherosclerosis (ICD10-I70.0) and Emphysema (ICD10-J43.9). Left main and 3 vessel coronary atherosclerosis. Moderate hiatal hernia. Chronic pancreatitis. Critical Value/emergent results were called by  telephone at the time of interpretation on 04/19/2019 at 3:59 pm to Dr. Heath Lark , who verbally acknowledged these results. Electronically Signed   By: Ilona Sorrel M.D.   On: 04/19/2019 16:05   US Paracentesis  Result Date: 04/19/2019 INDICATION: Malignant ascites. EXAM: ULTRASOUND GUIDED therapeutic PARACENTESIS MEDICATIONS: None. COMPLICATIONS: None immediate. PROCEDURE: Informed written consent was obtained from the patient after a discussion of the risks, benefits and alternatives to treatment. A timeout was performed prior to the initiation of the procedure. Initial ultrasound scanning demonstrates a large amount of ascites within the right lower abdominal quadrant. The right lower abdomen was prepped and draped in the usual sterile fashion. 1% lidocaine with epinephrine was used for local anesthesia. Following this, a paracentesis catheter was introduced. An ultrasound image was saved for documentation purposes. The paracentesis was performed. The catheter was removed and a dressing was applied. The patient tolerated the procedure well without immediate post procedural complication. FINDINGS: A total of approximately 5 L of serosanguineous fluid was removed. IMPRESSION: Successful ultrasound-guided paracentesis yielding 5 liters of peritoneal fluid. Electronically Signed   By: Marijo Conception M.D.   On: 04/19/2019 11:31   US Paracentesis  Result Date: 04/06/2019 INDICATION: Patient with history of ovarian cancer, recurrent malignant ascites, chronic kidney; request received therapeutic paracentesis up to 5 liters. EXAM: ULTRASOUND GUIDED THERAPEUTIC PARACENTESIS MEDICATIONS: None COMPLICATIONS: None immediate. PROCEDURE: Informed written consent was obtained from the patient after a discussion of the risks, benefits and alternatives to  treatment. A timeout was performed prior to the initiation of the procedure. Initial ultrasound scanning demonstrates a large amount of ascites within the right mid to  lower abdominal quadrant. The right mid to lower abdomen was prepped and draped in the usual sterile fashion. 1% lidocaine was used for local anesthesia. Following this, a 19 gauge, 10-cm, Yueh catheter was introduced. An ultrasound image was saved for documentation purposes. The paracentesis was performed. The catheter was removed and a dressing was applied. The patient tolerated the procedure well without immediate post procedural complication. FINDINGS: A total of approximately 5 liters of bloody fluid was removed. IMPRESSION: Successful ultrasound-guided therapeutic paracentesis yielding 5 liters of peritoneal fluid. Dr. [name] was notified of above findings. Read by: Rowe Robert, PA-C Electronically Signed   By: Markus Daft M.D.   On: 04/06/2019 17:34    All questions were answered. The patient knows to call the clinic with any problems, questions or concerns. No barriers to learning was detected.  Mikey Bussing, NP 04/20/2019 9:56 AM Heath Lark, MD

## 2019-04-20 NOTE — Progress Notes (Signed)
PROGRESS NOTE    Christy Ellis  MVE:[id number] DOB: 03-22-1951 DOA: 04/19/2019 PCP: Caryl Bis, MD    Brief Narrative: 68 year old lady with prior history of metastatic ovarian cancer stage III CKD, type 2 diabetes mellitus, hypertension, hyperlipidemia history of non-small cell lung cancer, hypothyroidism and gout presents with progressive shortness of breath and abdominal distention.  She was found to have bilateral PE and Diffuse irregular peritoneal thickening and hyperenhancement has worsened, compatible with progressive diffuse peritoneal carcinomatosis and worsening ascites   Patient seen and examined today.  Oncology on board.  She was started on IV heparin for bilateral acute PE.  Assessment & Plan:   Principal Problem:   Pulmonary embolism and infarction [facility]) Active Problems:   Ovarian cancer ([place])   Essential hypertension   History of lung cancer   Diabetes mellitus without complication (HCC)   CKD (chronic kidney disease), stage III ([place])    Ovarian cancer with progressive diffuse peritoneal carcinomatosis and ascites Patient follows up with Dr. [name] as outpatient.  Currently she is off chemotherapy. Awaiting further recommendations as per oncology.    Malignant ascites S/p paracentesis on 04/19/2019. Patient's abdomen is still distended and mildly tender in the lower quadrant.  She will probably need another session of paracentesis before discharge.  Bilateral acute pulmonary embolism Started the patient on IV heparin, possibly transition to Lovenox injections on discharge. Echocardiogram ordered, does not reveal right heart strain. Venous duplex of the lower extremities to rule out DVT ordered.   Essential hypertension Blood pressure parameters appear to be within normal limits.   Stage III CKD Creatinine appears to be at baseline at 1.9-2.    Type 2 diabetes mellitus A1c is 5.9. Diet controlled.   Anemia of chronic disease Patient's  baseline hemoglobin between 7-8.  Transfuse to keep hemoglobin greater than 7. Anemia panel ordered.   Mild hyperkalemia Potassium improved 5.2-5.1.   Hyperlipidemia Continue with statin    DVT prophylaxis: Heparin Code Status: Full code Family Communication: None at bedside Disposition Plan: Pending clinical improvement and further work-up. Consultants:   Oncology  Procedures:  Antimicrobials: None  Subjective: Patient reports her discomfort has improved when compared to yesterday and her shortness of breath has improved.  She denies any chest pain.  Objective: Vitals:   04/20/19 0000 04/20/19 0049 04/20/19 0608 04/20/19 1259  BP: 119/60 (!) 120/100 (!) 103/57 107/62  Pulse: 86 96 77 74  Resp: 20 18 18 18   Temp:  99.1 F (37.3 C) 98.7 F (37.1 C) 98.1 F (36.7 C)  TempSrc:  Oral Oral Oral  SpO2: 95% 97% 99% 94%  Weight:  62.1 kg    Height:  5\' 5"  (1.651 m)      Intake/Output Summary (Last 24 hours) at 04/20/2019 1312 Last data filed at 04/20/2019 0946 Gross per 24 hour  Intake 806.77 ml  Output 1 ml  Net 805.77 ml   Filed Weights   04/20/19 0049  Weight: 62.1 kg    Examination:  General exam: Does not appear to be in distress Respiratory system: Diminished air entry at bases, no wheezing or rhonchi Cardiovascular system: S1 & S2 heard, RRR leg edema Gastrointestinal system: Abdomen is soft, distended, mildly tender in the lower quadrant, bowel sounds are good Central nervous system: Alert and oriented.  Grossly nonfocal Extremities: Able to move both her extremities, leg edema present Skin: No rashes, lesions or ulcers Psychiatry: Mood & affect appropriate.     Data Reviewed: I have personally reviewed following  labs and imaging studies  CBC: Recent Labs  Lab 04/19/19 2000 04/20/19 1136  WBC 3.5* 5.1  NEUTROABS 2.5  --   HGB 11.7* 7.3*  HCT 37.5 23.1*  MCV 94.9 97.1  PLT 282 814   Basic Metabolic Panel: Recent Labs  Lab  04/19/19 2000 04/20/19 0515 04/20/19 1136  NA 134* 134* 137  K 5.2* 5.1 5.1  CL 106 107 109  CO2 17* 21* 21*  GLUCOSE 102* 75 92  BUN 36* 34* 31*  CREATININE 2.14* 1.91* 1.86*  CALCIUM 8.1* 7.6* 7.9*   GFR: Estimated Creatinine Clearance: 26 mL/min (A) (by C-G formula based on SCr of 1.86 mg/dL (H)). Liver Function Tests: Recent Labs  Lab 04/19/19 2000 04/20/19 0515  AST 37 29  ALT 21 17  ALKPHOS 110 93  BILITOT 0.2* 0.3  PROT 5.1* 4.5*  ALBUMIN 2.4* 2.0*   No results for input(s): LIPASE, AMYLASE in the last 168 hours. No results for input(s): AMMONIA in the last 168 hours. Coagulation Profile: Recent Labs  Lab 04/19/19 2000  INR 1.0   Cardiac Enzymes: No results for input(s): CKTOTAL, CKMB, CKMBINDEX, TROPONINI in the last 168 hours. BNP (last 3 results) No results for input(s): PROBNP in the last 8760 hours. HbA1C: Recent Labs    04/20/19 0202  HGBA1C 5.9*   CBG: Recent Labs  Lab 04/20/19 0735 04/20/19 1143  GLUCAP 87 74   Lipid Profile: No results for input(s): CHOL, HDL, LDLCALC, TRIG, CHOLHDL, LDLDIRECT in the last 72 hours. Thyroid Function Tests: No results for input(s): TSH, T4TOTAL, FREET4, T3FREE, THYROIDAB in the last 72 hours. Anemia Panel: No results for input(s): VITAMINB12, FOLATE, FERRITIN, TIBC, IRON, RETICCTPCT in the last 72 hours. Sepsis Labs: No results for input(s): PROCALCITON, LATICACIDVEN in the last 168 hours.  Recent Results (from the past 240 hour(s))  SARS Coronavirus 2 [facility] order, Performed in [facility] hospital lab) Nasopharyngeal Nasopharyngeal Swab     Status: None   Collection Time: 04/19/19  8:57 PM   Specimen: Nasopharyngeal Swab  Result Value Ref Range Status   SARS Coronavirus 2 NEGATIVE NEGATIVE Final    Comment: (NOTE) If result is NEGATIVE SARS-CoV-2 target nucleic acids are NOT DETECTED. The SARS-CoV-2 RNA is generally detectable in upper and lower  respiratory specimens during the acute phase of  infection. The lowest  concentration of SARS-CoV-2 viral copies this assay can detect is 250  copies / mL. A negative result does not preclude SARS-CoV-2 infection  and should not be used as the sole basis for treatment or other  patient management decisions.  A negative result may occur with  improper specimen collection / handling, submission of specimen other  than nasopharyngeal swab, presence of viral mutation(s) within the  areas targeted by this assay, and inadequate number of viral copies  (<250 copies / mL). A negative result must be combined with clinical  observations, patient history, and epidemiological information. If result is POSITIVE SARS-CoV-2 target nucleic acids are DETECTED. The SARS-CoV-2 RNA is generally detectable in upper and lower  respiratory specimens dur ing the acute phase of infection.  Positive  results are indicative of active infection with SARS-CoV-2.  Clinical  correlation with patient history and other diagnostic information is  necessary to determine patient infection status.  Positive results do  not rule out bacterial infection or co-infection with other viruses. If result is PRESUMPTIVE POSTIVE SARS-CoV-2 nucleic acids MAY BE PRESENT.   A presumptive positive result was obtained on the submitted specimen  and confirmed on repeat testing.  While 2019 novel coronavirus  (SARS-CoV-2) nucleic acids may be present in the submitted sample  additional confirmatory testing may be necessary for epidemiological  and / or clinical management purposes  to differentiate between  SARS-CoV-2 and other Sarbecovirus currently known to infect humans.  If clinically indicated additional testing with an alternate test  methodology [phone]) is advised. The SARS-CoV-2 RNA is generally  detectable in upper and lower respiratory sp ecimens during the acute  phase of infection. The expected result is Negative. Fact Sheet for Patients:   StrictlyIdeas.no Fact Sheet for Healthcare Providers: BankingDealers.co.za This test is not yet approved or cleared by the [place] FDA and has been authorized for detection and/or diagnosis of SARS-CoV-2 by FDA under an Emergency Use Authorization (EUA).  This EUA will remain in effect (meaning this test can be used) for the duration of the COVID-19 declaration under Section 564(b)(1) of the Act, 21 U.S.C. section 360bbb-3(b)(1), unless the authorization is terminated or revoked sooner. 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 Restaging. Paracentesis earlier today. EXAM: CT CHEST, ABDOMEN, AND PELVIS WITH CONTRAST TECHNIQUE: Multidetector CT imaging of the chest, abdomen and pelvis was performed following the standard protocol during bolus administration of intravenous contrast. CONTRAST:  169mL OMNIPAQUE IOHEXOL 300 MG/ML  SOLN COMPARISON:  01/31/2019 CT chest, abdomen and pelvis. FINDINGS: CT CHEST FINDINGS Cardiovascular: Normal heart size. No significant pericardial effusion/thickening. Left main and 3 vessel coronary atherosclerosis. Right internal jugular Port-A-Cath terminates in the lower third of the SVC. Atherosclerotic nonaneurysmal thoracic aorta. Normal caliber pulmonary arteries. Bilateral acute pulmonary embolism involving lobar, segmental and subsegmental branches. RV/LV ratio 1.3. Mediastinum/Nodes: Right hemithyroidectomy. No left thyroid nodules. Patulous thoracic esophagus filled with oral contrast. No axillary adenopathy. Mildly enlarged 1.0 cm right pericardiophrenic node, stable. Prevascular adenopathy measuring up to 1.1 cm (series 2/image 23), stable. No new pathologically enlarged mediastinal  nodes. No hilar adenopathy. Lungs/Pleura: No pneumothorax. Trace dependent right pleural effusion, stable. No left pleural effusion. Moderate centrilobular emphysema. Status post right lower lobectomy. Stable peripheral right upper lobe 4 mm solid pulmonary nodule (series 4/image 62). No acute consolidative airspace disease, lung masses or new significant pulmonary nodules. Musculoskeletal: No aggressive appearing focal osseous lesions. Stable post thoracotomy change in the posterior right ribs. CT ABDOMEN PELVIS FINDINGS Hepatobiliary: Normal liver size. Subcentimeter hypodense right liver lesion is too small to characterize and is stable. No new liver lesions. Normal gallbladder with no radiopaque cholelithiasis. No biliary ductal dilatation. Pancreas: Stable coarse calcifications in the pancreatic head compatible with chronic pancreatitis. No pancreatic mass or duct dilation. Spleen: Normal size. No mass. Adrenals/Urinary Tract: Normal adrenals. Simple exophytic 5.0 cm posterior upper left renal cyst. Several subcentimeter hypodense renal cortical lesions in both kidneys are too small to characterize and are unchanged. No hydronephrosis. Collapsed bladder without acute abnormality. Stomach/Bowel: Moderate hiatal hernia. Stomach is nondistended. Mass-effect on the stomach by ascitic fluid in the lesser omentum. Normal caliber small bowel with no small bowel wall thickening. Appendix not discretely visualized. Oral contrast transits to the rectum. Normal large bowel with no diverticulosis, large bowel wall thickening or pericolonic fat stranding. Vascular/Lymphatic: Atherosclerotic nonaneurysmal abdominal aorta. Patent portal, splenic, hepatic and renal veins. No pathologically enlarged lymph nodes in the abdomen or  pelvis. Reproductive: Stable heterogeneous uterus. No discrete adnexal masses. Other: Large volume ascites extending into small to moderate right inguinal hernia and small umbilical hernia, increased in  volume. No pneumoperitoneum. There is diffuse irregular peritoneal thickening and hyperenhancement throughout the abdomen and pelvis, which is worsened in the interval. Representative anterior pelvic 1.7 cm implant (series 2/image 90), previously 1.2 cm on 01/31/2019 CT. Left pelvic sidewall peritoneal thickening up to 0.9 cm (series 2/image 104), previously 0.7 cm. Left upper quadrant peritoneal thickness 1.7 cm (series 2/image 59), previously 1.3 cm. Right lower quadrant peritoneal thickness 1.0 cm (series 2/image 93), previously 0.7 cm. Mild anasarca. Musculoskeletal: No aggressive appearing focal osseous lesions. Moderate lower lumbar spondylosis. IMPRESSION: 1. Acute bilateral pulmonary embolism, extending proximally to the lobar level. 2. Positive for acute PE with CT evidence of right heart strain (RV/LV Ratio = 1.3) consistent with at least submassive (intermediate risk) PE. The presence of right heart strain has been associated with an increased risk of morbidity and mortality. 3. Mediastinal adenopathy is stable. Stable trace dependent right pleural effusion. No new or progressive metastatic disease in the chest. 4. Large volume ascites is increased. Diffuse irregular peritoneal thickening and hyperenhancement has worsened, compatible with progressive diffuse peritoneal carcinomatosis. 5. Chronic findings include: Aortic Atherosclerosis (ICD10-I70.0) and Emphysema (ICD10-J43.9). Left main and 3 vessel coronary atherosclerosis. Moderate hiatal hernia. Chronic pancreatitis. Critical Value/emergent results were called by telephone at the time of interpretation on 04/19/2019 at 3:59 pm to Dr. Heath Lark , who verbally acknowledged these results. Electronically Signed   By: Ilona Sorrel M.D.   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 Restaging. Paracentesis earlier  today. EXAM: CT CHEST, ABDOMEN, AND PELVIS WITH CONTRAST TECHNIQUE: Multidetector CT imaging of the chest, abdomen and pelvis was performed following the standard protocol during bolus administration of intravenous contrast. CONTRAST:  161mL OMNIPAQUE IOHEXOL 300 MG/ML  SOLN COMPARISON:  01/31/2019 CT chest, abdomen and pelvis. FINDINGS: CT CHEST FINDINGS Cardiovascular: Normal heart size. No significant pericardial effusion/thickening. Left main and 3 vessel coronary atherosclerosis. Right internal jugular Port-A-Cath terminates in the lower third of the SVC. Atherosclerotic nonaneurysmal thoracic aorta. Normal caliber pulmonary arteries. Bilateral acute pulmonary embolism involving lobar, segmental and subsegmental branches. RV/LV ratio 1.3. Mediastinum/Nodes: Right hemithyroidectomy. No left thyroid nodules. Patulous thoracic esophagus filled with oral contrast. No axillary adenopathy. Mildly enlarged 1.0 cm right pericardiophrenic node, stable. Prevascular adenopathy measuring up to 1.1 cm (series 2/image 23), stable. No new pathologically enlarged mediastinal nodes. No hilar adenopathy. Lungs/Pleura: No pneumothorax. Trace dependent right pleural effusion, stable. No left pleural effusion. Moderate centrilobular emphysema. Status post right lower lobectomy. Stable peripheral right upper lobe 4 mm solid pulmonary nodule (series 4/image 62). No acute consolidative airspace disease, lung masses or new significant pulmonary nodules. Musculoskeletal: No aggressive appearing focal osseous lesions. Stable post thoracotomy change in the posterior right ribs. CT ABDOMEN PELVIS FINDINGS Hepatobiliary: Normal liver size. Subcentimeter hypodense right liver lesion is too small to characterize and is stable. No new liver lesions. Normal gallbladder with no radiopaque cholelithiasis. No biliary ductal dilatation. Pancreas: Stable coarse calcifications in the pancreatic head compatible with chronic pancreatitis. No pancreatic  mass or duct dilation. Spleen: Normal size. No mass. Adrenals/Urinary Tract: Normal adrenals. Simple exophytic 5.0 cm posterior upper left renal cyst. Several subcentimeter hypodense renal cortical lesions in both kidneys are too small to characterize and are  unchanged. No hydronephrosis. Collapsed bladder without acute abnormality. Stomach/Bowel: Moderate hiatal hernia. Stomach is nondistended. Mass-effect on the stomach by ascitic fluid in the lesser omentum. Normal caliber small bowel with no small bowel wall thickening. Appendix not discretely visualized. Oral contrast transits to the rectum. Normal large bowel with no diverticulosis, large bowel wall thickening or pericolonic fat stranding. Vascular/Lymphatic: Atherosclerotic nonaneurysmal abdominal aorta. Patent portal, splenic, hepatic and renal veins. No pathologically enlarged lymph nodes in the abdomen or pelvis. Reproductive: Stable heterogeneous uterus. No discrete adnexal masses. Other: Large volume ascites extending into small to moderate right inguinal hernia and small umbilical hernia, increased in volume. No pneumoperitoneum. There is diffuse irregular peritoneal thickening and hyperenhancement throughout the abdomen and pelvis, which is worsened in the interval. Representative anterior pelvic 1.7 cm implant (series 2/image 90), previously 1.2 cm on 01/31/2019 CT. Left pelvic sidewall peritoneal thickening up to 0.9 cm (series 2/image 104), previously 0.7 cm. Left upper quadrant peritoneal thickness 1.7 cm (series 2/image 59), previously 1.3 cm. Right lower quadrant peritoneal thickness 1.0 cm (series 2/image 93), previously 0.7 cm. Mild anasarca. Musculoskeletal: No aggressive appearing focal osseous lesions. Moderate lower lumbar spondylosis. IMPRESSION: 1. Acute bilateral pulmonary embolism, extending proximally to the lobar level. 2. Positive for acute PE with CT evidence of right heart strain (RV/LV Ratio = 1.3) consistent with at least  submassive (intermediate risk) PE. The presence of right heart strain has been associated with an increased risk of morbidity and mortality. 3. Mediastinal adenopathy is stable. Stable trace dependent right pleural effusion. No new or progressive metastatic disease in the chest. 4. Large volume ascites is increased. Diffuse irregular peritoneal thickening and hyperenhancement has worsened, compatible with progressive diffuse peritoneal carcinomatosis. 5. Chronic findings include: Aortic Atherosclerosis (ICD10-I70.0) and Emphysema (ICD10-J43.9). Left main and 3 vessel coronary atherosclerosis. Moderate hiatal hernia. Chronic pancreatitis. Critical Value/emergent results were called by telephone at the time of interpretation on 04/19/2019 at 3:59 pm to Dr. Heath Lark , who verbally acknowledged these results. Electronically Signed   By: Ilona Sorrel M.D.   On: 04/19/2019 16:05   US Paracentesis  Result Date: 04/19/2019 INDICATION: Malignant ascites. EXAM: ULTRASOUND GUIDED therapeutic PARACENTESIS MEDICATIONS: None. COMPLICATIONS: None immediate. PROCEDURE: Informed written consent was obtained from the patient after a discussion of the risks, benefits and alternatives to treatment. A timeout was performed prior to the initiation of the procedure. Initial ultrasound scanning demonstrates a large amount of ascites within the right lower abdominal quadrant. The right lower abdomen was prepped and draped in the usual sterile fashion. 1% lidocaine with epinephrine was used for local anesthesia. Following this, a paracentesis catheter was introduced. An ultrasound image was saved for documentation purposes. The paracentesis was performed. The catheter was removed and a dressing was applied. The patient tolerated the procedure well without immediate post procedural complication. FINDINGS: A total of approximately 5 L of serosanguineous fluid was removed. IMPRESSION: Successful ultrasound-guided paracentesis yielding 5  liters of peritoneal fluid. Electronically Signed   By: Marijo Conception M.D.   On: 04/19/2019 11:31        Scheduled Meds:  allopurinol  300 mg Oral Daily   aspirin EC  81 mg Oral Daily   atorvastatin  80 mg Oral Daily   cholecalciferol  1,000 Units Oral Daily   cloNIDine  0.2 mg Oral BID   feeding supplement (ENSURE ENLIVE)  237 mL Oral BID BM   insulin aspart  0-5 Units Subcutaneous QHS   insulin aspart  0-9 Units Subcutaneous  TID WC   levothyroxine  100 mcg Oral QAC breakfast   Continuous Infusions:  sodium chloride 75 mL/hr at 04/20/19 0158   heparin       LOS: 1 day    Time spent: 42 minutes.     Hosie Poisson, MD [facility] Pager [phone]   If 7PM-7AM, please contact night-coverage [email or web address] Password [email or web address] 04/20/2019, 1:12 PM

## 2019-04-21 DIAGNOSIS — R180 Malignant ascites: Secondary | ICD-10-CM

## 2019-04-21 LAB — CBC
HCT: 22.9 % — ABNORMAL LOW (ref 36.0–46.0)
Hemoglobin: 7.1 g/dL — ABNORMAL LOW (ref 12.0–15.0)
MCH: 30.3 pg (ref 26.0–34.0)
MCHC: 31 g/dL (ref 30.0–36.0)
MCV: 97.9 fL (ref 80.0–100.0)
Platelets: 294 10*3/uL (ref 150–400)
RBC: 2.34 MIL/uL — ABNORMAL LOW (ref 3.87–5.11)
RDW: 18.1 % — ABNORMAL HIGH (ref 11.5–15.5)
WBC: 4.9 10*3/uL (ref 4.0–10.5)
nRBC: 0 % (ref 0.0–0.2)

## 2019-04-21 LAB — BASIC METABOLIC PANEL
Anion gap: 5 (ref 5–15)
BUN: 23 mg/dL (ref 8–23)
CO2: 22 mmol/L (ref 22–32)
Calcium: 7.9 mg/dL — ABNORMAL LOW (ref 8.9–10.3)
Chloride: 111 mmol/L (ref 98–111)
Creatinine, Ser: 1.32 mg/dL — ABNORMAL HIGH (ref 0.44–1.00)
GFR calc Af Amer: 48 mL/min — ABNORMAL LOW (ref >60)
GFR calc non Af Amer: 41 mL/min — ABNORMAL LOW (ref >60)
Glucose, Bld: 88 mg/dL (ref 70–99)
Potassium: 4.8 mmol/L (ref 3.5–5.1)
Sodium: 138 mmol/L (ref 135–145)

## 2019-04-21 LAB — HEPARIN LEVEL (UNFRACTIONATED): Heparin Unfractionated: 0.58 IU/mL (ref 0.30–0.70)

## 2019-04-21 LAB — GLUCOSE, CAPILLARY
Glucose-Capillary: 83 mg/dL (ref 70–99)
Glucose-Capillary: 85 mg/dL (ref 70–99)
Glucose-Capillary: 87 mg/dL (ref 70–99)
Glucose-Capillary: 75 mg/dL (ref 70–99)

## 2019-04-21 LAB — HIV ANTIBODY (ROUTINE TESTING W REFLEX): HIV Screen 4th Generation wRfx: NONREACTIVE

## 2019-04-21 MED ORDER — CEFAZOLIN SODIUM-DEXTROSE 2-4 GM/100ML-% IV SOLN
2.0000 g | INTRAVENOUS | Status: AC
  Administered 2019-04-23: 12:00:00 2 g via INTRAVENOUS

## 2019-04-21 MED ORDER — ALUM & MAG HYDROXIDE-SIMETH 200-200-20 MG/5ML PO SUSP
30.0000 mL | ORAL | Status: DC | PRN
  Administered 2019-04-21 – 2019-04-22 (×2): 30 mL via ORAL
  Filled 2019-04-21 (×2): qty 30

## 2019-04-21 NOTE — Progress Notes (Signed)
Referring Physician(s): Akula,V/Gorsuch,N  Supervising Physician: Sandi Mariscal  Patient Status:  [facility] - In-pt  Chief Complaint: Abdominal distention/ovarian cancer/recurrent ascites   Subjective: Patient familiar to IR service from Port-A-Cath placement on 6/43/3295, umbilical mass biopsy on 09/25/2018 and as well as multiple paracenteses, the last on 8/13 yielding 5 L.  She has a history of chronic kidney disease, diabetes, hypertension, hyperlipidemia, non-small cell lung cancer, hypothyroidism, gout, anemia and ovarian cancer with recurrent malignant ascites.  He was recently mated with dyspnea and abdominal distention as well as bilateral PE with worsening peritoneal carcinomatosis.  She is currently on IV heparin.  Request now received from primary service and oncology for tunneled peritoneal drain placement.  She currently denies fever, headache, chest pain, nausea, vomiting or bleeding.  Past Medical History:  Diagnosis Date   Chronic gouty arthritis    Diabetes mellitus without complication ([place])    Type 2   Hyperlipidemia    Hypertension    Lung cancer ([place]) 2007   History of Non Small Cell Lung Cancer   Thyroid disease    Hypothyroid   Past Surgical History:  Procedure Laterality Date   COLONOSCOPY  02/19/2016   IR IMAGING GUIDED PORT INSERTION  09/25/2018   IR PARACENTESIS  11/14/2018   IR PARACENTESIS  12/27/2018   IR PARACENTESIS  01/31/2019   IR US GUIDE BX ASP/DRAIN  09/25/2018   OTHER SURGICAL HISTORY  09/2005   Video-assisted thoracoscopic surgery for lung cancer by Dr. Prescott Gum   THYROID SURGERY     Some type of thyroid surgery at age 36      Allergies: Patient has no known allergies.  Medications: Prior to Admission medications   Medication Sig Start Date End Date Taking? Authorizing Provider  allopurinol (ZYLOPRIM) 300 MG tablet Take 300 mg by mouth daily.   Yes [provider]  aspirin EC 81 MG tablet Take 81 mg by mouth  daily.   Yes [provider]  atorvastatin (LIPITOR) 80 MG tablet Take 80 mg by mouth daily.   Yes [provider]  Cholecalciferol (VITAMIN D3 PO) Take 1,000 Units by mouth daily.   Yes [provider]  cloNIDine (CATAPRES) 0.2 MG tablet Take 0.2 mg by mouth 2 (two) times daily.   Yes [provider]  levothyroxine (SYNTHROID, LEVOTHROID) 100 MCG tablet Take 100 mcg by mouth daily before breakfast.   Yes [provider]  metFORMIN (GLUCOPHAGE-XR) 500 MG 24 hr tablet Take 2,000 mg by mouth daily with breakfast.   Yes [provider]  ondansetron (ZOFRAN) 8 MG tablet Take 1 tablet (8 mg total) by mouth every 8 (eight) hours as needed for refractory nausea / vomiting. Start on day 3 after chemo. 09/28/18  Yes Heath Lark, MD  prochlorperazine (COMPAZINE) 10 MG tablet Take 1 tablet (10 mg total) by mouth every 6 (six) hours as needed (Nausea or vomiting). 09/28/18  Yes Heath Lark, MD  lidocaine-prilocaine (EMLA) cream Apply to affected area once 09/28/18   Heath Lark, MD     Vital Signs: BP (!) 105/55 (BP Location: Right Arm)    Pulse 71    Temp 98.6 F (37 C) (Oral)    Resp 17    Ht 5\' 5"  (1.651 m)    Wt 136 lb 14.5 oz (62.1 kg)    SpO2 94%    BMI 22.78 kg/m   Physical Exam awake, alert.  Chest with diminished breath sounds right base, left clear.  Clean, intact right  chest wall Port-A-Cath.  Heart with regular rate and rhythm.  Abdomen distended, positive bowel sounds, mild generalized tenderness to palpation.  No lower extremity edema.   Imaging: Ct Chest W Contrast  Result Date: 04/19/2019 CLINICAL DATA:  Stage IV papillary serous ovarian cancer diagnosed December 2019 with ongoing chemotherapy. Restaging. Paracentesis earlier today. EXAM: CT CHEST, ABDOMEN, AND PELVIS WITH CONTRAST TECHNIQUE: Multidetector CT imaging of the chest, abdomen and pelvis was performed following the standard protocol during bolus administration of intravenous  contrast. CONTRAST:  158mL OMNIPAQUE IOHEXOL 300 MG/ML  SOLN COMPARISON:  01/31/2019 CT chest, abdomen and pelvis. FINDINGS: CT CHEST FINDINGS Cardiovascular: Normal heart size. No significant pericardial effusion/thickening. Left main and 3 vessel coronary atherosclerosis. Right internal jugular Port-A-Cath terminates in the lower third of the SVC. Atherosclerotic nonaneurysmal thoracic aorta. Normal caliber pulmonary arteries. Bilateral acute pulmonary embolism involving lobar, segmental and subsegmental branches. RV/LV ratio 1.3. Mediastinum/Nodes: Right hemithyroidectomy. No left thyroid nodules. Patulous thoracic esophagus filled with oral contrast. No axillary adenopathy. Mildly enlarged 1.0 cm right pericardiophrenic node, stable. Prevascular adenopathy measuring up to 1.1 cm (series 2/image 23), stable. No new pathologically enlarged mediastinal nodes. No hilar adenopathy. Lungs/Pleura: No pneumothorax. Trace dependent right pleural effusion, stable. No left pleural effusion. Moderate centrilobular emphysema. Status post right lower lobectomy. Stable peripheral right upper lobe 4 mm solid pulmonary nodule (series 4/image 62). No acute consolidative airspace disease, lung masses or new significant pulmonary nodules. Musculoskeletal: No aggressive appearing focal osseous lesions. Stable post thoracotomy change in the posterior right ribs. CT ABDOMEN PELVIS FINDINGS Hepatobiliary: Normal liver size. Subcentimeter hypodense right liver lesion is too small to characterize and is stable. No new liver lesions. Normal gallbladder with no radiopaque cholelithiasis. No biliary ductal dilatation. Pancreas: Stable coarse calcifications in the pancreatic head compatible with chronic pancreatitis. No pancreatic mass or duct dilation. Spleen: Normal size. No mass. Adrenals/Urinary Tract: Normal adrenals. Simple exophytic 5.0 cm posterior upper left renal cyst. Several subcentimeter hypodense renal cortical lesions in both  kidneys are too small to characterize and are unchanged. No hydronephrosis. Collapsed bladder without acute abnormality. Stomach/Bowel: Moderate hiatal hernia. Stomach is nondistended. Mass-effect on the stomach by ascitic fluid in the lesser omentum. Normal caliber small bowel with no small bowel wall thickening. Appendix not discretely visualized. Oral contrast transits to the rectum. Normal large bowel with no diverticulosis, large bowel wall thickening or pericolonic fat stranding. Vascular/Lymphatic: Atherosclerotic nonaneurysmal abdominal aorta. Patent portal, splenic, hepatic and renal veins. No pathologically enlarged lymph nodes in the abdomen or pelvis. Reproductive: Stable heterogeneous uterus. No discrete adnexal masses. Other: Large volume ascites extending into small to moderate right inguinal hernia and small umbilical hernia, increased in volume. No pneumoperitoneum. There is diffuse irregular peritoneal thickening and hyperenhancement throughout the abdomen and pelvis, which is worsened in the interval. Representative anterior pelvic 1.7 cm implant (series 2/image 90), previously 1.2 cm on 01/31/2019 CT. Left pelvic sidewall peritoneal thickening up to 0.9 cm (series 2/image 104), previously 0.7 cm. Left upper quadrant peritoneal thickness 1.7 cm (series 2/image 59), previously 1.3 cm. Right lower quadrant peritoneal thickness 1.0 cm (series 2/image 93), previously 0.7 cm. Mild anasarca. Musculoskeletal: No aggressive appearing focal osseous lesions. Moderate lower lumbar spondylosis. IMPRESSION: 1. Acute bilateral pulmonary embolism, extending proximally to the lobar level. 2. Positive for acute PE with CT evidence of right heart strain (RV/LV Ratio = 1.3) consistent with at least submassive (intermediate risk) PE. The presence of right heart strain has been associated with an increased risk of  morbidity and mortality. 3. Mediastinal adenopathy is stable. Stable trace dependent right pleural  effusion. No new or progressive metastatic disease in the chest. 4. Large volume ascites is increased. Diffuse irregular peritoneal thickening and hyperenhancement has worsened, compatible with progressive diffuse peritoneal carcinomatosis. 5. Chronic findings include: Aortic Atherosclerosis (ICD10-I70.0) and Emphysema (ICD10-J43.9). Left main and 3 vessel coronary atherosclerosis. Moderate hiatal hernia. Chronic pancreatitis. Critical Value/emergent results were called by telephone at the time of interpretation on 04/19/2019 at 3:59 pm to Dr. Heath Lark , who verbally acknowledged these results. Electronically Signed   By: Ilona Sorrel M.D.   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 Restaging. Paracentesis earlier today. EXAM: CT CHEST, ABDOMEN, AND PELVIS WITH CONTRAST TECHNIQUE: Multidetector CT imaging of the chest, abdomen and pelvis was performed following the standard protocol during bolus administration of intravenous contrast. CONTRAST:  168mL OMNIPAQUE IOHEXOL 300 MG/ML  SOLN COMPARISON:  01/31/2019 CT chest, abdomen and pelvis. FINDINGS: CT CHEST FINDINGS Cardiovascular: Normal heart size. No significant pericardial effusion/thickening. Left main and 3 vessel coronary atherosclerosis. Right internal jugular Port-A-Cath terminates in the lower third of the SVC. Atherosclerotic nonaneurysmal thoracic aorta. Normal caliber pulmonary arteries. Bilateral acute pulmonary embolism involving lobar, segmental and subsegmental branches. RV/LV ratio 1.3. Mediastinum/Nodes: Right hemithyroidectomy. No left thyroid nodules. Patulous thoracic esophagus filled with oral contrast. No axillary adenopathy. Mildly enlarged 1.0 cm right pericardiophrenic node, stable. Prevascular adenopathy measuring up to 1.1 cm (series 2/image 23), stable. No new pathologically enlarged  mediastinal nodes. No hilar adenopathy. Lungs/Pleura: No pneumothorax. Trace dependent right pleural effusion, stable. No left pleural effusion. Moderate centrilobular emphysema. Status post right lower lobectomy. Stable peripheral right upper lobe 4 mm solid pulmonary nodule (series 4/image 62). No acute consolidative airspace disease, lung masses or new significant pulmonary nodules. Musculoskeletal: No aggressive appearing focal osseous lesions. Stable post thoracotomy change in the posterior right ribs. CT ABDOMEN PELVIS FINDINGS Hepatobiliary: Normal liver size. Subcentimeter hypodense right liver lesion is too small to characterize and is stable. No new liver lesions. Normal gallbladder with no radiopaque cholelithiasis. No biliary ductal dilatation. Pancreas: Stable coarse calcifications in the pancreatic head compatible with chronic pancreatitis. No pancreatic mass or duct dilation. Spleen: Normal size. No mass. Adrenals/Urinary Tract: Normal adrenals. Simple exophytic 5.0 cm posterior upper left renal cyst. Several subcentimeter hypodense renal cortical lesions in both kidneys are too small to characterize and are unchanged. No hydronephrosis. Collapsed bladder without acute abnormality. Stomach/Bowel: Moderate hiatal hernia. Stomach is nondistended. Mass-effect on the stomach by ascitic fluid in the lesser omentum. Normal caliber small bowel with no small bowel wall thickening. Appendix not discretely visualized. Oral contrast transits to the rectum. Normal large bowel with no diverticulosis, large bowel wall thickening or pericolonic fat stranding. Vascular/Lymphatic: Atherosclerotic nonaneurysmal abdominal aorta. Patent portal, splenic, hepatic and renal veins. No pathologically enlarged lymph nodes in the abdomen or pelvis. Reproductive: Stable heterogeneous uterus. No discrete adnexal masses. Other: Large volume ascites extending into small to moderate right inguinal hernia and small umbilical hernia,  increased in volume. No pneumoperitoneum. There is diffuse irregular peritoneal thickening and hyperenhancement throughout the abdomen and pelvis, which is worsened in the interval. Representative anterior pelvic 1.7 cm implant (series 2/image 90), previously 1.2 cm on 01/31/2019 CT. Left pelvic sidewall peritoneal thickening up to 0.9 cm (series 2/image 104), previously 0.7 cm. Left upper quadrant peritoneal thickness 1.7 cm (series  2/image 59), previously 1.3 cm. Right lower quadrant peritoneal thickness 1.0 cm (series 2/image 93), previously 0.7 cm. Mild anasarca. Musculoskeletal: No aggressive appearing focal osseous lesions. Moderate lower lumbar spondylosis. IMPRESSION: 1. Acute bilateral pulmonary embolism, extending proximally to the lobar level. 2. Positive for acute PE with CT evidence of right heart strain (RV/LV Ratio = 1.3) consistent with at least submassive (intermediate risk) PE. The presence of right heart strain has been associated with an increased risk of morbidity and mortality. 3. Mediastinal adenopathy is stable. Stable trace dependent right pleural effusion. No new or progressive metastatic disease in the chest. 4. Large volume ascites is increased. Diffuse irregular peritoneal thickening and hyperenhancement has worsened, compatible with progressive diffuse peritoneal carcinomatosis. 5. Chronic findings include: Aortic Atherosclerosis (ICD10-I70.0) and Emphysema (ICD10-J43.9). Left main and 3 vessel coronary atherosclerosis. Moderate hiatal hernia. Chronic pancreatitis. Critical Value/emergent results were called by telephone at the time of interpretation on 04/19/2019 at 3:59 pm to Dr. Heath Lark , who verbally acknowledged these results. Electronically Signed   By: Ilona Sorrel M.D.   On: 04/19/2019 16:05   US Paracentesis  Result Date: 04/19/2019 INDICATION: Malignant ascites. EXAM: ULTRASOUND GUIDED therapeutic PARACENTESIS MEDICATIONS: None. COMPLICATIONS: None immediate. PROCEDURE:  Informed written consent was obtained from the patient after a discussion of the risks, benefits and alternatives to treatment. A timeout was performed prior to the initiation of the procedure. Initial ultrasound scanning demonstrates a large amount of ascites within the right lower abdominal quadrant. The right lower abdomen was prepped and draped in the usual sterile fashion. 1% lidocaine with epinephrine was used for local anesthesia. Following this, a paracentesis catheter was introduced. An ultrasound image was saved for documentation purposes. The paracentesis was performed. The catheter was removed and a dressing was applied. The patient tolerated the procedure well without immediate post procedural complication. FINDINGS: A total of approximately 5 L of serosanguineous fluid was removed. IMPRESSION: Successful ultrasound-guided paracentesis yielding 5 liters of peritoneal fluid. Electronically Signed   By: Marijo Conception M.D.   On: 04/19/2019 11:31   Vas [place] Lower Extremity Venous (dvt)  Result Date: 04/20/2019  Lower Venous Study Indications: Pulmonary embolism.  Performing Technologist: Maudry Mayhew MHA, RDMS, RVT, RDCS  Examination Guidelines: A complete evaluation includes B-mode imaging, spectral Doppler, color Doppler, and power Doppler as needed of all accessible portions of each vessel. Bilateral testing is considered an integral part of a complete examination. Limited examinations for reoccurring indications may be performed as noted.  +---------+---------------+---------+-----------+----------+-------+  RIGHT     Compressibility Phasicity Spontaneity Properties Summary  +---------+---------------+---------+-----------+----------+-------+  CFV       Full            Yes       Yes                             +---------+---------------+---------+-----------+----------+-------+  SFJ       Full                                                       +---------+---------------+---------+-----------+----------+-------+  FV Prox   Full                                                      +---------+---------------+---------+-----------+----------+-------+  FV Mid    Full                                                      +---------+---------------+---------+-----------+----------+-------+  FV Distal Full                                                      +---------+---------------+---------+-----------+----------+-------+  PFV       Full                                                      +---------+---------------+---------+-----------+----------+-------+  POP       Full            Yes       Yes                             +---------+---------------+---------+-----------+----------+-------+  PTV       Full                                                      +---------+---------------+---------+-----------+----------+-------+  PERO      Full                                                      +---------+---------------+---------+-----------+----------+-------+   +---------+---------------+---------+-----------+----------+-------+  LEFT      Compressibility Phasicity Spontaneity Properties Summary  +---------+---------------+---------+-----------+----------+-------+  CFV       Full            Yes       Yes                             +---------+---------------+---------+-----------+----------+-------+  SFJ       Full                                                      +---------+---------------+---------+-----------+----------+-------+  FV Prox   Full                                                      +---------+---------------+---------+-----------+----------+-------+  FV Mid    Full                                                      +---------+---------------+---------+-----------+----------+-------+  FV Distal Full                                                      +---------+---------------+---------+-----------+----------+-------+  PFV       Full                                                       +---------+---------------+---------+-----------+----------+-------+  POP       Full            Yes       Yes                             +---------+---------------+---------+-----------+----------+-------+  PTV       Full                                                      +---------+---------------+---------+-----------+----------+-------+  PERO      Full                                                      +---------+---------------+---------+-----------+----------+-------+     Summary: Right: There is no evidence of deep vein thrombosis in the lower extremity. No cystic structure found in the popliteal fossa. Left: There is no evidence of deep vein thrombosis in the lower extremity. No cystic structure found in the popliteal fossa.  *See table(s) above for measurements and observations.    Preliminary     Labs:  CBC: Recent Labs    04/06/19 1011 04/19/19 2000 04/20/19 1136 04/20/19 1345 04/21/19 0743  WBC 3.1* 3.5* 5.1  --  4.9  HGB 8.9* 11.7* 7.3* 7.2* 7.1*  HCT 27.3* 37.5 23.1* 22.8* 22.9*  PLT 292 282 312  --  294    COAGS: Recent Labs    09/25/18 1148 04/19/19 2000  INR 0.95 1.0    BMP: Recent Labs    04/19/19 2000 04/20/19 0515 04/20/19 1136 04/21/19 0743  NA 134* 134* 137 138  K 5.2* 5.1 5.1 4.8  CL 106 107 109 111  CO2 17* 21* 21* 22  GLUCOSE 102* 75 92 88  BUN 36* 34* 31* 23  CALCIUM 8.1* 7.6* 7.9* 7.9*  CREATININE 2.14* 1.91* 1.86* 1.32*  GFRNONAA 23* 26* 27* 41*  GFRAA 27* 31* 32* 48*    LIVER FUNCTION TESTS: Recent Labs    03/30/19 0938 04/06/19 1011 04/19/19 2000 04/20/19 0515  BILITOT <0.2* 0.3 0.2* 0.3  AST 28 32 37 29  ALT 14 18 21 17   ALKPHOS 118 122 110 93  PROT 5.4* 5.8* 5.1* 4.5*  ALBUMIN 2.4* 2.7* 2.4* 2.0*    Assessment and Plan: Pt with history of chronic kidney disease, diabetes, hypertension, hyperlipidemia, non-small cell lung cancer, hypothyroidism, gout, anemia and  ovarian cancer with recurrent malignant ascites.  He was recently  mated with dyspnea and abdominal distention as well as bilateral PE with worsening peritoneal carcinomatosis.  She is currently on IV heparin.  Request now received from primary service and oncology for tunneled peritoneal drain placement.  Imaging studies were reviewed by Dr. Earleen Newport.  Details/risk peritoneal drain placement, including but not limited to, internal bleeding, infection, injury to adjacent structures discussed with patient with her understanding and consent.  We will tentatively plan case for 8/17.  IV heparin will need to be stopped 2 to 4 hours preprocedure. Check labs 8/17 am.    Electronically Signed: D. Rowe Robert, PA-C 04/21/2019, 5:39 PM   I spent a total of 25 minutes at the the patient's bedside AND on the patient's hospital floor or unit, greater than 50% of which was counseling/coordinating care for tunneled peritoneal drain placement    Patient ID: Christy Ellis, female   DOB: August 10, 1951, 68 y.o.   MRN: [id number]

## 2019-04-21 NOTE — Progress Notes (Signed)
PROGRESS NOTE    Christy Ellis  XBJ:[id number] DOB: 1950-11-11 DOA: 04/19/2019 PCP: Caryl Bis, MD    Brief Narrative: 68 year old lady with prior history of metastatic ovarian cancer stage III CKD, type 2 diabetes mellitus, hypertension, hyperlipidemia history of non-small cell lung cancer, hypothyroidism and gout presents with progressive shortness of breath and abdominal distention.  She was found to have bilateral PE and Diffuse irregular peritoneal thickening and hyperenhancement has worsened, compatible with progressive diffuse peritoneal carcinomatosis and worsening ascites   Patient seen and examined today she denies any new complaints.   Oncology on board.  She was started on IV heparin for bilateral acute PE. Oncology had a goals of care meeting with the patient and her son and transition to home hospice on discharge.  But in view of her recurrent ascites and weekly paracentesis we have requested IR to put a drainage catheter for palliative purposes.  Currently waiting for drainage catheter to be placed so she can be discharged home.  Assessment & Plan:   Principal Problem:   Pulmonary embolism and infarction [facility]) Active Problems:   Ovarian cancer ([place])   Essential hypertension   History of lung cancer   Diabetes mellitus without complication (HCC)   CKD (chronic kidney disease), stage III ([place])    Ovarian cancer with progressive diffuse peritoneal carcinomatosis and ascites Patient follows up with Dr. [name] as outpatient.  Currently she is off chemotherapy. In view of her worsening malignancy and new peritoneal carcinomatosis and a bilateral PE oncologist had goals of care meeting with the patient and the son and transition to home hospice on discharge.  But in view of her recurrent ascites and weekly paracentesis we have requested IR to put a drainage catheter for palliative purposes.  Currently waiting for drainage catheter to be placed so she can be  discharged home.  Malignant ascites S/p paracentesis on 04/19/2019. Patient's abdomen is still distended and mildly tender in the lower quadrant.  She will probably need another session of paracentesis before discharge. WILL order US paracentesis.   Bilateral acute pulmonary embolism Started the patient on IV heparin, transition to oral Eliquis on discharge after discussing with oncology. Echocardiogram ordered, does not reveal right heart strain.    Essential hypertension Well-controlled   Stage III CKD Creatinine appears to be better than baseline    Type 2 diabetes mellitus A1c is 5.9. Diet controlled.   Anemia of chronic disease Patient's baseline hemoglobin between 7-8.  Transfuse to keep hemoglobin greater than 7.  Hemoglobin at 7.1    Mild hyperkalemia Resolved with hydration.   Hyperlipidemia Continue with statin    DVT prophylaxis: Heparin Code Status: Full code Family Communication: None at bedside Disposition Plan: Pending clinical improvement and further work-up. Consultants:   Oncology  Procedures:  Antimicrobials: None  Subjective: Some abdominal discomfort but no pain, nausea or vomiting.  Objective: Vitals:   04/20/19 1259 04/20/19 2053 04/21/19 0530 04/21/19 1410  BP: 107/62 123/72 (!) 98/47 (!) 105/55  Pulse: 74 95 80 71  Resp: 18 20 16 17   Temp: 98.1 F (36.7 C) 99.3 F (37.4 C) 97.9 F (36.6 C) 98.6 F (37 C)  TempSrc: Oral Oral Oral Oral  SpO2: 94% 98% 93% 94%  Weight:      Height:        Intake/Output Summary (Last 24 hours) at 04/21/2019 1709 Last data filed at 04/21/2019 1520 Gross per 24 hour  Intake 2337.2 ml  Output 250 ml  Net 2087.2 ml  Filed Weights   04/20/19 0049  Weight: 62.1 kg    Examination:  General exam: Alert and comfortable Respiratory system: Diminished at bases, no wheezing or rhonchi Cardiovascular system: S1 & S2 heard, regular rate rhythm, leg edema present Gastrointestinal system:  Abdomen is soft, nondistended, no tenderness today bowel sounds are good Central nervous system: Alert and oriented.  Grossly nonfocal Extremities: Able to move all her extremities mild leg edema present Skin: No rashes, lesions or ulcers Psychiatry: Mood & affect appropriate.     Data Reviewed: I have personally reviewed following labs and imaging studies  CBC: Recent Labs  Lab 04/19/19 2000 04/20/19 1136 04/20/19 1345 04/21/19 0743  WBC 3.5* 5.1  --  4.9  NEUTROABS 2.5  --   --   --   HGB 11.7* 7.3* 7.2* 7.1*  HCT 37.5 23.1* 22.8* 22.9*  MCV 94.9 97.1  --  97.9  PLT 282 312  --  532   Basic Metabolic Panel: Recent Labs  Lab 04/19/19 2000 04/20/19 0515 04/20/19 1136 04/21/19 0743  NA 134* 134* 137 138  K 5.2* 5.1 5.1 4.8  CL 106 107 109 111  CO2 17* 21* 21* 22  GLUCOSE 102* 75 92 88  BUN 36* 34* 31* 23  CREATININE 2.14* 1.91* 1.86* 1.32*  CALCIUM 8.1* 7.6* 7.9* 7.9*   GFR: Estimated Creatinine Clearance: 36.7 mL/min (A) (by C-G formula based on SCr of 1.32 mg/dL (H)). Liver Function Tests: Recent Labs  Lab 04/19/19 2000 04/20/19 0515  AST 37 29  ALT 21 17  ALKPHOS 110 93  BILITOT 0.2* 0.3  PROT 5.1* 4.5*  ALBUMIN 2.4* 2.0*   No results for input(s): LIPASE, AMYLASE in the last 168 hours. No results for input(s): AMMONIA in the last 168 hours. Coagulation Profile: Recent Labs  Lab 04/19/19 2000  INR 1.0   Cardiac Enzymes: No results for input(s): CKTOTAL, CKMB, CKMBINDEX, TROPONINI in the last 168 hours. BNP (last 3 results) No results for input(s): PROBNP in the last 8760 hours. HbA1C: Recent Labs    04/20/19 0202  HGBA1C 5.9*   CBG: Recent Labs  Lab 04/20/19 1553 04/20/19 2149 04/21/19 0801 04/21/19 1146 04/21/19 1638  GLUCAP 87 90 83 85 87   Lipid Profile: No results for input(s): CHOL, HDL, LDLCALC, TRIG, CHOLHDL, LDLDIRECT in the last 72 hours. Thyroid Function Tests: No results for input(s): TSH, T4TOTAL, FREET4, T3FREE,  THYROIDAB in the last 72 hours. Anemia Panel: Recent Labs    04/20/19 1345  VITAMINB12 329  FOLATE 19.7  FERRITIN 354*  TIBC 130*  IRON 29  RETICCTPCT 3.3*   Sepsis Labs: No results for input(s): PROCALCITON, LATICACIDVEN in the last 168 hours.  Recent Results (from the past 240 hour(s))  SARS Coronavirus 2 [facility] order, Performed in [facility] hospital lab) Nasopharyngeal Nasopharyngeal Swab     Status: None   Collection Time: 04/19/19  8:57 PM   Specimen: Nasopharyngeal Swab  Result Value Ref Range Status   SARS Coronavirus 2 NEGATIVE NEGATIVE Final    Comment: (NOTE) If result is NEGATIVE SARS-CoV-2 target nucleic acids are NOT DETECTED. The SARS-CoV-2 RNA is generally detectable in upper and lower  respiratory specimens during the acute phase of infection. The lowest  concentration of SARS-CoV-2 viral copies this assay can detect is 250  copies / mL. A negative result does not preclude SARS-CoV-2 infection  and should not be used as the sole basis for treatment or other  patient management decisions.  A negative result  may occur with  improper specimen collection / handling, submission of specimen other  than nasopharyngeal swab, presence of viral mutation(s) within the  areas targeted by this assay, and inadequate number of viral copies  (<250 copies / mL). A negative result must be combined with clinical  observations, patient history, and epidemiological information. If result is POSITIVE SARS-CoV-2 target nucleic acids are DETECTED. The SARS-CoV-2 RNA is generally detectable in upper and lower  respiratory specimens dur ing the acute phase of infection.  Positive  results are indicative of active infection with SARS-CoV-2.  Clinical  correlation with patient history and other diagnostic information is  necessary to determine patient infection status.  Positive results do  not rule out bacterial infection or co-infection with other viruses. If result is  PRESUMPTIVE POSTIVE SARS-CoV-2 nucleic acids MAY BE PRESENT.   A presumptive positive result was obtained on the submitted specimen  and confirmed on repeat testing.  While 2019 novel coronavirus  (SARS-CoV-2) nucleic acids may be present in the submitted sample  additional confirmatory testing may be necessary for epidemiological  and / or clinical management purposes  to differentiate between  SARS-CoV-2 and other Sarbecovirus currently known to infect humans.  If clinically indicated additional testing with an alternate test  methodology [phone]) is advised. The SARS-CoV-2 RNA is generally  detectable in upper and lower respiratory sp ecimens during the acute  phase of infection. The expected result is Negative. Fact Sheet for Patients:  StrictlyIdeas.no Fact Sheet for Healthcare Providers: BankingDealers.co.za This test is not yet approved or cleared by the [place] FDA and has been authorized for detection and/or diagnosis of SARS-CoV-2 by FDA under an Emergency Use Authorization (EUA).  This EUA will remain in effect (meaning this test can be used) for the duration of the COVID-19 declaration under Section 564(b)(1) of the Act, 21 U.S.C. section 360bbb-3(b)(1), unless the authorization is terminated or revoked sooner. Performed at [facility], [place] [address], [place], [place] [address]          Radiology Studies: Vas [place] Lower Extremity Venous (dvt)  Result Date: 04/20/2019  Lower Venous Study Indications: Pulmonary embolism.  Performing Technologist: Maudry Mayhew MHA, RDMS, RVT, RDCS  Examination Guidelines: A complete evaluation includes B-mode imaging, spectral Doppler, color Doppler, and power Doppler as needed of all accessible portions of each vessel. Bilateral testing is considered an integral part of a complete examination. Limited examinations for reoccurring indications may be performed as  noted.  +---------+---------------+---------+-----------+----------+-------+ RIGHT    CompressibilityPhasicitySpontaneityPropertiesSummary +---------+---------------+---------+-----------+----------+-------+ CFV      Full           Yes      Yes                          +---------+---------------+---------+-----------+----------+-------+ SFJ      Full                                                 +---------+---------------+---------+-----------+----------+-------+ FV Prox  Full                                                 +---------+---------------+---------+-----------+----------+-------+ FV Mid   Full                                                 +---------+---------------+---------+-----------+----------+-------+  FV DistalFull                                                 +---------+---------------+---------+-----------+----------+-------+ PFV      Full                                                 +---------+---------------+---------+-----------+----------+-------+ POP      Full           Yes      Yes                          +---------+---------------+---------+-----------+----------+-------+ PTV      Full                                                 +---------+---------------+---------+-----------+----------+-------+ PERO     Full                                                 +---------+---------------+---------+-----------+----------+-------+   +---------+---------------+---------+-----------+----------+-------+ LEFT     CompressibilityPhasicitySpontaneityPropertiesSummary +---------+---------------+---------+-----------+----------+-------+ CFV      Full           Yes      Yes                          +---------+---------------+---------+-----------+----------+-------+ SFJ      Full                                                 +---------+---------------+---------+-----------+----------+-------+ FV  Prox  Full                                                 +---------+---------------+---------+-----------+----------+-------+ FV Mid   Full                                                 +---------+---------------+---------+-----------+----------+-------+ FV DistalFull                                                 +---------+---------------+---------+-----------+----------+-------+ PFV      Full                                                 +---------+---------------+---------+-----------+----------+-------+ POP  Full           Yes      Yes                          +---------+---------------+---------+-----------+----------+-------+ PTV      Full                                                 +---------+---------------+---------+-----------+----------+-------+ PERO     Full                                                 +---------+---------------+---------+-----------+----------+-------+     Summary: Right: There is no evidence of deep vein thrombosis in the lower extremity. No cystic structure found in the popliteal fossa. Left: There is no evidence of deep vein thrombosis in the lower extremity. No cystic structure found in the popliteal fossa.  *See table(s) above for measurements and observations.    Preliminary         Scheduled Meds: . allopurinol  300 mg Oral Daily  . aspirin EC  81 mg Oral Daily  . atorvastatin  80 mg Oral Daily  . cholecalciferol  1,000 Units Oral Daily  . cloNIDine  0.2 mg Oral BID  . feeding supplement (ENSURE ENLIVE)  237 mL Oral BID BM  . insulin aspart  0-5 Units Subcutaneous QHS  . insulin aspart  0-9 Units Subcutaneous TID WC  . levothyroxine  100 mcg Oral QAC breakfast   Continuous Infusions: . sodium chloride 75 mL/hr at 04/21/19 0230  . heparin 700 Units/hr (04/20/19 1920)     LOS: 2 days    Time spent: 32 minutes.     Hosie Poisson, MD [facility] Pager [phone]   If 7PM-7AM,  please contact night-coverage [email or web address] Password [facility] 04/21/2019, 5:09 PM

## 2019-04-21 NOTE — Progress Notes (Signed)
Pharmacy Brief Note - Anticoagulation Follow Up:  Patient on IV heparin for bilateral PE with evidence of right heart strain.   Assessment:  HL = 0.51 is therapeutic on heparin infusion of 700 units/hr  Confirmed with RN that heparin infusing at correct rate with no issues or interruptions. No signs/symptoms of bruising or bleeding.   Plt - WNL, stable  Hgb - 7.2 (8/14) decreased since admission. Per MD notes, patient's baseline Hgb is between 7=8.   Goal: Heparin level 0.3-0.7  Plan:  Continue heparin infusion at current rate of 700 units/hr  Check confirmatory HL in 8 hours  Recheck CBC with AM labs  Lenis Noon, PharmD 04/21/19 12:27 AM

## 2019-04-21 NOTE — Progress Notes (Signed)
ANTICOAGULATION CONSULT NOTE - Follow Up Consult  Pharmacy Consult for heparin Indication: acute pulmonary embolus  No Known Allergies  Patient Measurements: Height: 5\' 5"  (165.1 cm) Weight: 136 lb 14.5 oz (62.1 kg) IBW/kg (Calculated) : 57 Heparin Dosing Weight: 62 kg  Vital Signs: Temp: 97.9 F (36.6 C) (08/15 0530) Temp Source: Oral (08/15 0530) BP: 98/47 (08/15 0530) Pulse Rate: 80 (08/15 0530)  Labs: Recent Labs    04/19/19 2000 04/20/19 0202 04/20/19 0515 04/20/19 1136 04/20/19 1345 04/20/19 2220 04/21/19 0743  HGB 11.7*  --   --  7.3* 7.2*  --  7.1*  HCT 37.5  --   --  23.1* 22.8*  --  22.9*  PLT 282  --   --  312  --   --  294  LABPROT 12.7  --   --   --   --   --   --   INR 1.0  --   --   --   --   --   --   HEPARINUNFRC  --  1.12*  --  1.00*  --  0.51  --   CREATININE 2.14*  --  1.91* 1.86*  --   --   --   TROPONINIHS 13 13  --   --   --   --   --     Estimated Creatinine Clearance: 26 mL/min (A) (by C-G formula based on SCr of 1.86 mg/dL (H)).   Assessment: Patient is a 68 y.o with ovarian cancer and recurrent ascites presented to the ED on 8/13 with bilateral PE noted on outpatient CT scan.  LE doppler on 8/14 was negative for DVT.  Heparin drip started on admission for acute PE.  - Per Dr. Calton Dach note on 8/14, recom. to eventually transition patient to Lovenox or DOAC. If to proceed with Eliquis, she recom. to start once pt has had drainage catheter placed in her abdomen.  Today, 04/21/2019: - heparin level remains therapeutic at 0.58 - Hgb low 7.1 but stable  - plts ok - per pt's RN, no bleeding noted  Goal of Therapy:  Heparin level 0.3-0.7 units/ml Monitor platelets by anticoagulation protocol: Yes   Plan:  - continue heparin drip at 700 units/hr - daily heparin level - monitor for s/s bleeding - please advise if/when heparin drip needs to be held for drainage catheter placement  Lelynd Poer P 04/21/2019,8:47 AM

## 2019-04-22 LAB — CBC
HCT: 20.6 % — ABNORMAL LOW (ref 36.0–46.0)
Hemoglobin: 6.3 g/dL — CL (ref 12.0–15.0)
MCH: 30.1 pg (ref 26.0–34.0)
MCHC: 30.6 g/dL (ref 30.0–36.0)
MCV: 98.6 fL (ref 80.0–100.0)
Platelets: 273 10*3/uL (ref 150–400)
RBC: 2.09 MIL/uL — ABNORMAL LOW (ref 3.87–5.11)
RDW: 18.2 % — ABNORMAL HIGH (ref 11.5–15.5)
WBC: 5 10*3/uL (ref 4.0–10.5)
nRBC: 0 % (ref 0.0–0.2)

## 2019-04-22 LAB — HEPARIN LEVEL (UNFRACTIONATED): Heparin Unfractionated: 0.41 IU/mL (ref 0.30–0.70)

## 2019-04-22 LAB — PREPARE RBC (CROSSMATCH)

## 2019-04-22 LAB — ABO/RH: ABO/RH(D): O POS

## 2019-04-22 LAB — HEMOGLOBIN AND HEMATOCRIT, BLOOD
HCT: 25.3 % — ABNORMAL LOW (ref 36.0–46.0)
Hemoglobin: 8.1 g/dL — ABNORMAL LOW (ref 12.0–15.0)

## 2019-04-22 LAB — GLUCOSE, CAPILLARY
Glucose-Capillary: 84 mg/dL (ref 70–99)
Glucose-Capillary: 86 mg/dL (ref 70–99)
Glucose-Capillary: 99 mg/dL (ref 70–99)
Glucose-Capillary: 82 mg/dL (ref 70–99)

## 2019-04-22 MED ORDER — SODIUM CHLORIDE 0.9% IV SOLUTION
Freq: Once | INTRAVENOUS | Status: AC
  Administered 2019-04-22: 06:00:00 via INTRAVENOUS

## 2019-04-22 NOTE — Progress Notes (Signed)
CRITICAL VALUE ALERT  Critical Value:  Hbg 6.3  Date & Time Notied:  0355 8/16  Provider Notified: Alger Memos, MD  Orders Received/Actions taken: Transfuse 1u of blood.

## 2019-04-22 NOTE — Progress Notes (Signed)
Assumed care from previous RN. Agree with patient assessment. Patient sitting in bed eating graham crackers, no complaints at this time. Will monitor.

## 2019-04-22 NOTE — Progress Notes (Signed)
ANTICOAGULATION CONSULT NOTE - Follow Up Consult  Pharmacy Consult for heparin Indication: acute pulmonary embolus  No Known Allergies  Patient Measurements: Height: 5\' 5"  (165.1 cm) Weight: 136 lb 14.5 oz (62.1 kg) IBW/kg (Calculated) : 57 Heparin Dosing Weight: 62 kg  Vital Signs: Temp: 98.9 F (37.2 C) (08/16 0615) Temp Source: Oral (08/16 0615) BP: 104/53 (08/16 0615) Pulse Rate: 88 (08/16 0615)  Labs: Recent Labs    04/19/19 2000  04/20/19 0202 04/20/19 0515 04/20/19 1136 04/20/19 1345 04/20/19 2220 04/21/19 0743 04/22/19 0216  HGB 11.7*  --   --   --  7.3* 7.2*  --  7.1* 6.3*  HCT 37.5  --   --   --  23.1* 22.8*  --  22.9* 20.6*  PLT 282  --   --   --  312  --   --  294 273  LABPROT 12.7  --   --   --   --   --   --   --   --   INR 1.0  --   --   --   --   --   --   --   --   HEPARINUNFRC  --    < > 1.12*  --  1.00*  --  0.51 0.58 0.41  CREATININE 2.14*  --   --  1.91* 1.86*  --   --  1.32*  --   TROPONINIHS 13  --  13  --   --   --   --   --   --    < > = values in this interval not displayed.    Estimated Creatinine Clearance: 36.7 mL/min (A) (by C-G formula based on SCr of 1.32 mg/dL (H)).   Assessment: Patient is a 68 y.o with ovarian cancer and recurrent ascites presented to the ED on 8/13 with bilateral PE noted on outpatient CT scan.  LE doppler on 8/14 was negative for DVT.  Heparin drip started on admission for acute PE.  - Per Dr. Calton Dach note on 8/14, recom. to eventually transition patient to Lovenox or DOAC. If to proceed with Eliquis, she recom. to start once pt has had drainage catheter placed in her abdomen.  Today, 04/22/2019: - heparin level remains therapeutic at 0.41 - Hgb low at 6.3 - plts ok - no bleeding noted  Goal of Therapy:  Heparin level 0.3-0.7 units/ml Monitor platelets by anticoagulation protocol: Yes   Plan:  - continue heparin drip at 700 units/hr - daily heparin level - monitor for s/s bleeding - planning to  hold heparin 2 to 4 hours pre drain placement 8/17  Thank you Anette Guarneri, PharmD 04/22/2019,7:18 AM

## 2019-04-22 NOTE — Progress Notes (Signed)
PROGRESS NOTE    Christy Ellis  ZOX:[id number] DOB: 02-08-1951 DOA: 04/19/2019 PCP: Caryl Bis, MD    Brief Narrative: 68 year old lady with prior history of metastatic ovarian cancer stage III CKD, type 2 diabetes mellitus, hypertension, hyperlipidemia history of non-small cell lung cancer, hypothyroidism and gout presents with progressive shortness of breath and abdominal distention.  She was found to have bilateral PE and Diffuse irregular peritoneal thickening and hyperenhancement has worsened, compatible with progressive diffuse peritoneal carcinomatosis and worsening ascites   Patient seen and examined today she denies any new complaints.   Oncology on board.  She was started on IV heparin for bilateral acute PE. Oncology had a goals of care meeting with the patient and her son and transition to home hospice on discharge.  But in view of her recurrent ascites and weekly paracentesis we have requested IR to put a drainage catheter for palliative purposes.  Currently waiting for peritoneal drainage catheter to be placed so she can be discharged home in the next 24 hours.   Assessment & Plan:   Principal Problem:   Pulmonary embolism and infarction [facility]) Active Problems:   Ovarian cancer ([place])   Essential hypertension   History of lung cancer   Diabetes mellitus without complication (HCC)   CKD (chronic kidney disease), stage III ([place])    Ovarian cancer with progressive diffuse peritoneal carcinomatosis and ascites Patient follows up with Dr. [name] as outpatient.  Currently she is off chemotherapy. In view of her worsening malignancy and new peritoneal carcinomatosis and a bilateral PE oncologist had goals of care meeting with the patient and the son and transition to home hospice on discharge.  But in view of her recurrent ascites and weekly paracentesis we have requested IR to put a drainage catheter for palliative purposes.  Currently waiting for drainage catheter to  be placed so she can be discharged home.  Malignant ascites S/p paracentesis on 04/19/2019. Patient's abdomen is still distended and mildly tender in the lower quadrant.  She will probably need another session of paracentesis before discharge. US Paracentesis to be done at the time of peritoneal drainage catheter placement.   Bilateral acute pulmonary embolism Started the patient on IV heparin, transition to oral Eliquis on discharge after discussing with oncology. Echocardiogram ordered, does not reveal right heart strain.    Essential hypertension Well-controlled   Stage III CKD Creatinine appears to be better than baseline    Type 2 diabetes mellitus A1c is 5.9. Diet controlled.   Anemia of chronic disease Patient's baseline hemoglobin between 7-8.  Hemoglobin dropped to 6.3 today but there is no obvious signs of bleeding or bruising.  Transfuse 1 unit of PRBC and repeat H&H this evening.    Mild hyperkalemia Resolved with hydration.   Hyperlipidemia Continue with statin    DVT prophylaxis: Heparin Code Status: Full code Family Communication: None at bedside Disposition Plan: Pending peritoneal catheter placement and discharge tomorrow. Consultants:   Oncology  Procedures: Ultrasound paracentesis and peritoneal drain placement scheduled for tomorrow Antimicrobials: None  Subjective: Appears in good spirits , continues to use to have abdominal distention.  Objective: Vitals:   04/22/19 0542 04/22/19 0615 04/22/19 0753 04/22/19 1251  BP: 107/61 (!) 104/53 122/65 102/68  Pulse: 87 88 82 89  Resp: 16 (!) 21 16 20   Temp: 99 F (37.2 C) 98.9 F (37.2 C) 98.3 F (36.8 C) 98.2 F (36.8 C)  TempSrc: Oral Oral Oral Oral  SpO2: 97%  100% 100%  Weight:  Height:        Intake/Output Summary (Last 24 hours) at 04/22/2019 1529 Last data filed at 04/22/2019 1500 Gross per 24 hour  Intake 840.7 ml  Output --  Net 840.7 ml   Filed Weights   04/20/19  0049  Weight: 62.1 kg    Examination:  General exam: Alert and comfortable not in any kind of distress Respiratory system: Diminished at bases, no wheezing or rhonchi. Cardiovascular system: S1-S2 heard, regular rate rhythm leg edema present Gastrointestinal system: Abdomen is soft, distended, nontender, bowel sounds are good Central nervous system: Alert and oriented to place and person Extremities: Pedal edema present no cyanosis or clubbing Skin: No rash is evident Psychiatry: Mood is appropriate    Data Reviewed: I have personally reviewed following labs and imaging studies  CBC: Recent Labs  Lab 04/19/19 2000 04/20/19 1136 04/20/19 1345 04/21/19 0743 04/22/19 0216  WBC 3.5* 5.1  --  4.9 5.0  NEUTROABS 2.5  --   --   --   --   HGB 11.7* 7.3* 7.2* 7.1* 6.3*  HCT 37.5 23.1* 22.8* 22.9* 20.6*  MCV 94.9 97.1  --  97.9 98.6  PLT 282 312  --  294 580   Basic Metabolic Panel: Recent Labs  Lab 04/19/19 2000 04/20/19 0515 04/20/19 1136 04/21/19 0743  NA 134* 134* 137 138  K 5.2* 5.1 5.1 4.8  CL 106 107 109 111  CO2 17* 21* 21* 22  GLUCOSE 102* 75 92 88  BUN 36* 34* 31* 23  CREATININE 2.14* 1.91* 1.86* 1.32*  CALCIUM 8.1* 7.6* 7.9* 7.9*   GFR: Estimated Creatinine Clearance: 36.7 mL/min (A) (by C-G formula based on SCr of 1.32 mg/dL (H)). Liver Function Tests: Recent Labs  Lab 04/19/19 2000 04/20/19 0515  AST 37 29  ALT 21 17  ALKPHOS 110 93  BILITOT 0.2* 0.3  PROT 5.1* 4.5*  ALBUMIN 2.4* 2.0*   No results for input(s): LIPASE, AMYLASE in the last 168 hours. No results for input(s): AMMONIA in the last 168 hours. Coagulation Profile: Recent Labs  Lab 04/19/19 2000  INR 1.0   Cardiac Enzymes: No results for input(s): CKTOTAL, CKMB, CKMBINDEX, TROPONINI in the last 168 hours. BNP (last 3 results) No results for input(s): PROBNP in the last 8760 hours. HbA1C: Recent Labs    04/20/19 0202  HGBA1C 5.9*   CBG: Recent Labs  Lab 04/21/19 1146  04/21/19 1638 04/21/19 2137 04/22/19 0810 04/22/19 1155  GLUCAP 85 87 75 84 86   Lipid Profile: No results for input(s): CHOL, HDL, LDLCALC, TRIG, CHOLHDL, LDLDIRECT in the last 72 hours. Thyroid Function Tests: No results for input(s): TSH, T4TOTAL, FREET4, T3FREE, THYROIDAB in the last 72 hours. Anemia Panel: Recent Labs    04/20/19 1345  VITAMINB12 329  FOLATE 19.7  FERRITIN 354*  TIBC 130*  IRON 29  RETICCTPCT 3.3*   Sepsis Labs: No results for input(s): PROCALCITON, LATICACIDVEN in the last 168 hours.  Recent Results (from the past 240 hour(s))  SARS Coronavirus 2 [facility] order, Performed in [facility] hospital lab) Nasopharyngeal Nasopharyngeal Swab     Status: None   Collection Time: 04/19/19  8:57 PM   Specimen: Nasopharyngeal Swab  Result Value Ref Range Status   SARS Coronavirus 2 NEGATIVE NEGATIVE Final    Comment: (NOTE) If result is NEGATIVE SARS-CoV-2 target nucleic acids are NOT DETECTED. The SARS-CoV-2 RNA is generally detectable in upper and lower  respiratory specimens during the acute phase of infection. The lowest  concentration of SARS-CoV-2 viral copies this assay can detect is 250  copies / mL. A negative result does not preclude SARS-CoV-2 infection  and should not be used as the sole basis for treatment or other  patient management decisions.  A negative result may occur with  improper specimen collection / handling, submission of specimen other  than nasopharyngeal swab, presence of viral mutation(s) within the  areas targeted by this assay, and inadequate number of viral copies  (<250 copies / mL). A negative result must be combined with clinical  observations, patient history, and epidemiological information. If result is POSITIVE SARS-CoV-2 target nucleic acids are DETECTED. The SARS-CoV-2 RNA is generally detectable in upper and lower  respiratory specimens dur ing the acute phase of infection.  Positive  results are indicative of  active infection with SARS-CoV-2.  Clinical  correlation with patient history and other diagnostic information is  necessary to determine patient infection status.  Positive results do  not rule out bacterial infection or co-infection with other viruses. If result is PRESUMPTIVE POSTIVE SARS-CoV-2 nucleic acids MAY BE PRESENT.   A presumptive positive result was obtained on the submitted specimen  and confirmed on repeat testing.  While 2019 novel coronavirus  (SARS-CoV-2) nucleic acids may be present in the submitted sample  additional confirmatory testing may be necessary for epidemiological  and / or clinical management purposes  to differentiate between  SARS-CoV-2 and other Sarbecovirus currently known to infect humans.  If clinically indicated additional testing with an alternate test  methodology [phone]) is advised. The SARS-CoV-2 RNA is generally  detectable in upper and lower respiratory sp ecimens during the acute  phase of infection. The expected result is Negative. Fact Sheet for Patients:  StrictlyIdeas.no Fact Sheet for Healthcare Providers: BankingDealers.co.za This test is not yet approved or cleared by the [place] FDA and has been authorized for detection and/or diagnosis of SARS-CoV-2 by FDA under an Emergency Use Authorization (EUA).  This EUA will remain in effect (meaning this test can be used) for the duration of the COVID-19 declaration under Section 564(b)(1) of the Act, 21 U.S.C. section 360bbb-3(b)(1), unless the authorization is terminated or revoked sooner. Performed at [facility], [place] [address] [place], [place] [address]          Radiology Studies: Vas [place] Lower Extremity Venous (dvt)  Result Date: 04/22/2019  Lower Venous Study Indications: Pulmonary embolism.  Performing Technologist: Maudry Mayhew MHA, RDMS, RVT, RDCS  Examination Guidelines: A complete evaluation  includes B-mode imaging, spectral Doppler, color Doppler, and power Doppler as needed of all accessible portions of each vessel. Bilateral testing is considered an integral part of a complete examination. Limited examinations for reoccurring indications may be performed as noted.  +---------+---------------+---------+-----------+----------+-------+  RIGHT     Compressibility Phasicity Spontaneity Properties Summary  +---------+---------------+---------+-----------+----------+-------+  CFV       Full            Yes       Yes                             +---------+---------------+---------+-----------+----------+-------+  SFJ       Full                                                      +---------+---------------+---------+-----------+----------+-------+  FV Prox   Full                                                      +---------+---------------+---------+-----------+----------+-------+  FV Mid    Full                                                      +---------+---------------+---------+-----------+----------+-------+  FV Distal Full                                                      +---------+---------------+---------+-----------+----------+-------+  PFV       Full                                                      +---------+---------------+---------+-----------+----------+-------+  POP       Full            Yes       Yes                             +---------+---------------+---------+-----------+----------+-------+  PTV       Full                                                      +---------+---------------+---------+-----------+----------+-------+  PERO      Full                                                      +---------+---------------+---------+-----------+----------+-------+   +---------+---------------+---------+-----------+----------+-------+  LEFT      Compressibility Phasicity Spontaneity Properties Summary  +---------+---------------+---------+-----------+----------+-------+  CFV        Full            Yes       Yes                             +---------+---------------+---------+-----------+----------+-------+  SFJ       Full                                                      +---------+---------------+---------+-----------+----------+-------+  FV Prox   Full                                                      +---------+---------------+---------+-----------+----------+-------+  FV Mid    Full                                                      +---------+---------------+---------+-----------+----------+-------+  FV Distal Full                                                      +---------+---------------+---------+-----------+----------+-------+  PFV       Full                                                      +---------+---------------+---------+-----------+----------+-------+  POP       Full            Yes       Yes                             +---------+---------------+---------+-----------+----------+-------+  PTV       Full                                                      +---------+---------------+---------+-----------+----------+-------+  PERO      Full                                                      +---------+---------------+---------+-----------+----------+-------+     Summary: Right: There is no evidence of deep vein thrombosis in the lower extremity. No cystic structure found in the popliteal fossa. Left: There is no evidence of deep vein thrombosis in the lower extremity. No cystic structure found in the popliteal fossa.  *See table(s) above for measurements and observations. Electronically signed by Ruta Hinds MD on 04/22/2019 at 12:17:06 PM.    Final         Scheduled Meds:  allopurinol  300 mg Oral Daily   aspirin EC  81 mg Oral Daily   atorvastatin  80 mg Oral Daily   cholecalciferol  1,000 Units Oral Daily   cloNIDine  0.2 mg Oral BID   feeding supplement (ENSURE ENLIVE)  237 mL Oral BID BM   insulin aspart  0-5 Units Subcutaneous QHS     insulin aspart  0-9 Units Subcutaneous TID WC   levothyroxine  100 mcg Oral QAC breakfast   Continuous Infusions:  [START ON 04/23/2019]  ceFAZolin (ANCEF) IV     heparin 700 Units/hr (04/22/19 0624)     LOS: 3 days    Time spent: 31 minutes.     Hosie Poisson, MD [facility] Pager [phone]   If 7PM-7AM, please contact night-coverage [email or web address] Password [facility] 04/22/2019, 3:29 PM

## 2019-04-23 ENCOUNTER — Telehealth: Payer: Self-pay | Admitting: *Deleted

## 2019-04-23 ENCOUNTER — Inpatient Hospital Stay (HOSPITAL_COMMUNITY): Payer: Medicare HMO

## 2019-04-23 ENCOUNTER — Encounter (HOSPITAL_COMMUNITY): Payer: Self-pay | Admitting: Interventional Radiology

## 2019-04-23 ENCOUNTER — Other Ambulatory Visit: Payer: Self-pay | Admitting: Hematology and Oncology

## 2019-04-23 HISTORY — PX: IR PERC TUN PERIT CATH WO PORT S&I /IMAG: IMG2327

## 2019-04-23 LAB — CBC WITH DIFFERENTIAL/PLATELET
Abs Immature Granulocytes: 0.07 10*3/uL (ref 0.00–0.07)
Basophils Absolute: 0 10*3/uL (ref 0.0–0.1)
Basophils Relative: 1 %
Eosinophils Absolute: 0.1 10*3/uL (ref 0.0–0.5)
Eosinophils Relative: 1 %
HCT: 24.7 % — ABNORMAL LOW (ref 36.0–46.0)
Hemoglobin: 7.7 g/dL — ABNORMAL LOW (ref 12.0–15.0)
Immature Granulocytes: 1 %
Lymphocytes Relative: 17 %
Lymphs Abs: 1 10*3/uL (ref 0.7–4.0)
MCH: 30.2 pg (ref 26.0–34.0)
MCHC: 31.2 g/dL (ref 30.0–36.0)
MCV: 96.9 fL (ref 80.0–100.0)
Monocytes Absolute: 0.6 10*3/uL (ref 0.1–1.0)
Monocytes Relative: 10 %
Neutro Abs: 4.2 10*3/uL (ref 1.7–7.7)
Neutrophils Relative %: 70 %
Platelets: 264 10*3/uL (ref 150–400)
RBC: 2.55 MIL/uL — ABNORMAL LOW (ref 3.87–5.11)
RDW: 18 % — ABNORMAL HIGH (ref 11.5–15.5)
WBC: 6 10*3/uL (ref 4.0–10.5)
nRBC: 0 % (ref 0.0–0.2)

## 2019-04-23 LAB — BASIC METABOLIC PANEL
Anion gap: 6 (ref 5–15)
BUN: 19 mg/dL (ref 8–23)
CO2: 21 mmol/L — ABNORMAL LOW (ref 22–32)
Calcium: 8.1 mg/dL — ABNORMAL LOW (ref 8.9–10.3)
Chloride: 113 mmol/L — ABNORMAL HIGH (ref 98–111)
Creatinine, Ser: 1.11 mg/dL — ABNORMAL HIGH (ref 0.44–1.00)
GFR calc Af Amer: 59 mL/min — ABNORMAL LOW (ref >60)
GFR calc non Af Amer: 51 mL/min — ABNORMAL LOW (ref >60)
Glucose, Bld: 95 mg/dL (ref 70–99)
Potassium: 5.1 mmol/L (ref 3.5–5.1)
Sodium: 140 mmol/L (ref 135–145)

## 2019-04-23 LAB — BPAM RBC
Blood Product Expiration Date: 202009112359
ISSUE DATE / TIME: 202008160551
Unit Type and Rh: 5100

## 2019-04-23 LAB — TYPE AND SCREEN
ABO/RH(D): O POS
Antibody Screen: NEGATIVE
Unit division: 0

## 2019-04-23 LAB — GLUCOSE, CAPILLARY
Glucose-Capillary: 83 mg/dL (ref 70–99)
Glucose-Capillary: 81 mg/dL (ref 70–99)

## 2019-04-23 LAB — PROTIME-INR
INR: 1 (ref 0.8–1.2)
Prothrombin Time: 13.1 seconds (ref 11.4–15.2)

## 2019-04-23 LAB — HEPARIN LEVEL (UNFRACTIONATED): Heparin Unfractionated: 0.39 IU/mL (ref 0.30–0.70)

## 2019-04-23 MED ORDER — ENSURE ENLIVE PO LIQD: 237.0000 mL | Freq: Two times a day (BID) | ORAL | 12 refills | Status: AC

## 2019-04-23 MED ORDER — HEPARIN SOD (PORK) LOCK FLUSH 100 UNIT/ML IV SOLN
INTRAVENOUS | Status: AC | PRN
  Administered 2019-04-23: 500 [IU] via INTRAVENOUS

## 2019-04-23 MED ORDER — FENTANYL CITRATE (PF) 100 MCG/2ML IJ SOLN
INTRAMUSCULAR | Status: AC
  Filled 2019-04-23: qty 2

## 2019-04-23 MED ORDER — HEPARIN SOD (PORK) LOCK FLUSH 100 UNIT/ML IV SOLN
INTRAVENOUS | Status: AC
  Filled 2019-04-23: qty 5

## 2019-04-23 MED ORDER — LIDOCAINE HCL (PF) 1 % IJ SOLN
INTRAMUSCULAR | Status: AC | PRN
  Administered 2019-04-23 (×2): 10 mL

## 2019-04-23 MED ORDER — MIDAZOLAM HCL 2 MG/2ML IJ SOLN
INTRAMUSCULAR | Status: AC
  Filled 2019-04-23: qty 4

## 2019-04-23 MED ORDER — FENTANYL CITRATE (PF) 100 MCG/2ML IJ SOLN
INTRAMUSCULAR | Status: AC | PRN
  Administered 2019-04-23 (×2): 50 ug via INTRAVENOUS

## 2019-04-23 MED ORDER — MIDAZOLAM HCL 2 MG/2ML IJ SOLN
INTRAMUSCULAR | Status: AC | PRN
  Administered 2019-04-23 (×2): 1 mg via INTRAVENOUS

## 2019-04-23 MED ORDER — CEFAZOLIN SODIUM-DEXTROSE 2-4 GM/100ML-% IV SOLN
INTRAVENOUS | Status: AC
  Administered 2019-04-23: 2 g via INTRAVENOUS
  Filled 2019-04-23: qty 100

## 2019-04-23 MED ORDER — APIXABAN 2.5 MG PO TABS: 2.5000 mg | ORAL_TABLET | Freq: Two times a day (BID) | ORAL | 0 refills | Status: AC

## 2019-04-23 NOTE — TOC Initial Note (Signed)
Transition of Care [facility]) - Initial/Assessment Note    Patient Details  Name: Christy Ellis MRN: [id number] Date of Birth: [birth year]  Transition of Care [facility]) CM/SW Contact:    Wende Neighbors, LCSW Phone Number: 04/23/2019, 10:50 AM  Clinical Narrative:     CSW spoke with patient about discharge plans. Patient stated she wants to discharge home with hospice. Patient stated she want to have [place] following because they are the only agency in [place] that can do Hospice.  CSW spoke with Cassandra from [facility] and she stated they will be able to follow patient in the home. Cassandra stated they will be able to provide DME for patient if needed. Cassandra stated she will reach out to family today to start the process                Expected Discharge Plan: Home w Hospice Care Barriers to Discharge: Continued Medical Work up   Patient Goals and CMS Choice Patient states their goals for this hospitalization and ongoing recovery are:: to go home CMS Medicare.gov Compare Post Acute Care list provided to:: Patient Choice offered to / list presented to : Patient  Expected Discharge Plan and Services Expected Discharge Plan: [place] Acute Care Choice: (home with hospice) Living arrangements for the past 2 months: [place] Date [place]: 04/23/19 Time [place]: [id number] Representative spoke with at [place]: [place] Arrangements/Services Living arrangements for the past 2 months: [place] with:: Self Patient language and need for interpreter reviewed:: Yes Do you feel safe going back to the place where you live?: Yes      Need for Family Participation in Patient Care: Yes (Comment) Care giver support system in place?: Yes (comment)   Criminal Activity/Legal Involvement Pertinent to Current  Situation/Hospitalization: No - Comment as needed  Activities of Daily Living Home Assistive Devices/Equipment: None ADL Screening (condition at time of admission) Patient's cognitive ability adequate to safely complete daily activities?: Yes Is the patient deaf or have difficulty hearing?: No Does the patient have difficulty seeing, even when wearing glasses/contacts?: No Does the patient have difficulty concentrating, remembering, or making decisions?: No Patient able to express need for assistance with ADLs?: Yes Does the patient have difficulty dressing or bathing?: No Independently performs ADLs?: No Does the patient have difficulty walking or climbing stairs?: No Weakness of Legs: None Weakness of Arms/Hands: None  Permission Sought/Granted Permission sought to share information with : Family Supports Permission granted to share information with : Yes, Verbal Permission Granted  Share Information with NAME: [name].     Permission granted to share info w Relationship: son  Permission granted to share info w Contact Information: [phone]  Emotional Assessment Appearance:: Appears stated age Attitude/Demeanor/Rapport: Engaged Affect (typically observed): Accepting Orientation: : Oriented to Self, Oriented to Place, Oriented to Situation, Oriented to  Time Alcohol / Substance Use: Not Applicable Psych Involvement: No (comment)  Admission diagnosis:  CAncer Pt Blood Clot in Lungs  Patient Active Problem List   Diagnosis Date Noted  . Pulmonary embolism and infarction ([place]) 04/19/2019  . Pleural effusion on right 03/07/2019  . Pancytopenia, acquired ([place]) 12/25/2018  . Peripheral neuropathy due to chemotherapy ([place])  12/25/2018  . Anemia in neoplastic disease 10/20/2018  . Malignant ascites 10/20/2018  . CKD (chronic kidney disease), stage III ([place]) 09/29/2018  . Goals of care, counseling/discussion 09/29/2018  . Ovarian cancer ([place]) 09/15/2018  . Essential  hypertension 09/15/2018  . History of lung cancer 09/15/2018  . Diabetes mellitus without complication ([place]) 29/29/0903   PCP:  Caryl Bis, MD Pharmacy:   [facility], [place] [address] Phone: [phone] Fax: [phone]     Social Determinants of Health (SDOH) Interventions    Readmission Risk Interventions No flowsheet data found.

## 2019-04-23 NOTE — Plan of Care (Signed)

## 2019-04-23 NOTE — Procedures (Signed)
Pre Procedural Dx: Recurrent malignant ascites Post Procedural Dx: Same  Successful imaged guided placement of a tunneled peritoneal drainage catheter. Over 8 L of bloody fluid was aspirated followed drain placement.   EBL: None Complications: None immediate  Ronny Bacon, MD Pager #: [phone]

## 2019-04-23 NOTE — Progress Notes (Signed)
Christy Ellis   DOB:1951/06/26   QT#:[id number]    ASSESSMENT & PLAN:  Bilateral pulmonary emboli Acute bilateral pulmonary emboli with evidence of right heart strain noted on restaging CT scan of the chest Patient has been started on heparin with some improvement in her shortness of breath Continue heparin, dosing per pharmacy We will need to transition her to other anticoagulation such as Lovenox vs. DOAC Due to her background, I felt that Eliquis might be a good choice for her in the long-term I recommend only transitioning her to Eliquis once that she had drainage catheter placed in her abdomen  Ovarian cancer ([place]) I am very concerned about the recurrent ascites Clinically, she is not improving Chemotherapy was held her last visit and restaging CT scans were ordered  CT scans show stable mediastinal adenopathy, small trace dependent right pleural effusion without progressive metastatic disease in the chest.  She has large volume ascites which has increased and diffuse irregular peritoneal thickening and hyperenhancement which has worsened.  This is compatible with progressive diffuse peritoneal carcinomatosis. CA125 is rising I have a long discussion with the patient and her son The patient has platinum refractory disease She has lost a lot of weight and overall has decline in performance status We discussed palliative care/hospice and discontinuation of chemotherapy She agreed with the plan of care  Malignant ascites She has recurrent malignant ascites requiring almost weekly paracentesis She continues to have a distended abdomen despite paracentesis on 04/19/2019 We discussed the risk and benefits of placement of drainage catheter and she agreed to proceed We will also consult palliative care/hospice at home to help manage drainage catheter upon discharge  CKD (chronic kidney disease), stage III ([place]) She has intermittent acute on chronic renal failure Renal function has  improved today.  Pleural effusion on right Clinically, she has reduced breath sounds on the right lung I am concerned about recurrent pleural effusion CT chest shows a trace dependent right pleural effusion Will monitor  Anemia She is not symptomatic  CODE STATUS I discussed the CODE STATUS with the patient She agreed for DNR We discussed poor prognosis today, she will likely have less than 3 months with her current clinical condition  Discharge planning Hopefully, she can be discharged later after placement of drainage catheter I will send referral for outpatient palliative care/hospice referral I will sign off.  Please call if questions arise  All questions were answered. The patient knows to call the clinic with any problems, questions or concerns.   Heath Lark, MD 04/23/2019 8:26 AM  Subjective:  She feels well.  Denies bleeding.  Complained of abdominal distention, awaiting placement of drainage catheter for long-term management of recurrent malignant ascites  Objective:  Vitals:   04/22/19 2021 04/23/19 0459  BP: 120/65 (!) 102/58  Pulse: 87 84  Resp: 16 16  Temp: 98.3 F (36.8 C) 98.2 F (36.8 C)  SpO2: 99% 96%     Intake/Output Summary (Last 24 hours) at 04/23/2019 0826 Last data filed at 04/23/2019 0600 Gross per 24 hour  Intake 546.88 ml  Output 0 ml  Net 546.88 ml    GENERAL:alert, no distress and comfortable ABDOMEN:abdomen is mildly tense, distended with ascites  musculoskeletal:no cyanosis of digits and no clubbing  NEURO: alert & oriented x 3 with fluent speech, no focal motor/sensory deficits   Labs:  Recent Labs    04/06/19 1011 04/19/19 2000 04/20/19 0515 04/20/19 1136 04/21/19 0743 04/23/19 0523  NA 140 134* 134* 137 138  140  K 4.9 5.2* 5.1 5.1 4.8 5.1  CL 112* 106 107 109 111 113*  CO2 19* 17* 21* 21* 22 21*  GLUCOSE 88 102* 75 92 88 95  BUN 23 36* 34* 31* 23 19  CREATININE 1.30* 2.14* 1.91* 1.86* 1.32* 1.11*  CALCIUM 8.6* 8.1*  7.6* 7.9* 7.9* 8.1*  GFRNONAA 42* 23* 26* 27* 41* 51*  GFRAA 49* 27* 31* 32* 48* 59*  PROT 5.8* 5.1* 4.5*  --   --   --   ALBUMIN 2.7* 2.4* 2.0*  --   --   --   AST 32 37 29  --   --   --   ALT 18 21 17   --   --   --   ALKPHOS 122 110 93  --   --   --   BILITOT 0.3 0.2* 0.3  --   --   --     Studies:  Ct Chest W Contrast  Result Date: 04/19/2019 CLINICAL DATA:  Stage IV papillary serous ovarian cancer diagnosed December 2019 with ongoing chemotherapy. Restaging. Paracentesis earlier today. EXAM: CT CHEST, ABDOMEN, AND PELVIS WITH CONTRAST TECHNIQUE: Multidetector CT imaging of the chest, abdomen and pelvis was performed following the standard protocol during bolus administration of intravenous contrast. CONTRAST:  119mL OMNIPAQUE IOHEXOL 300 MG/ML  SOLN COMPARISON:  01/31/2019 CT chest, abdomen and pelvis. FINDINGS: CT CHEST FINDINGS Cardiovascular: Normal heart size. No significant pericardial effusion/thickening. Left main and 3 vessel coronary atherosclerosis. Right internal jugular Port-A-Cath terminates in the lower third of the SVC. Atherosclerotic nonaneurysmal thoracic aorta. Normal caliber pulmonary arteries. Bilateral acute pulmonary embolism involving lobar, segmental and subsegmental branches. RV/LV ratio 1.3. Mediastinum/Nodes: Right hemithyroidectomy. No left thyroid nodules. Patulous thoracic esophagus filled with oral contrast. No axillary adenopathy. Mildly enlarged 1.0 cm right pericardiophrenic node, stable. Prevascular adenopathy measuring up to 1.1 cm (series 2/image 23), stable. No new pathologically enlarged mediastinal nodes. No hilar adenopathy. Lungs/Pleura: No pneumothorax. Trace dependent right pleural effusion, stable. No left pleural effusion. Moderate centrilobular emphysema. Status post right lower lobectomy. Stable peripheral right upper lobe 4 mm solid pulmonary nodule (series 4/image 62). No acute consolidative airspace disease, lung masses or new significant  pulmonary nodules. Musculoskeletal: No aggressive appearing focal osseous lesions. Stable post thoracotomy change in the posterior right ribs. CT ABDOMEN PELVIS FINDINGS Hepatobiliary: Normal liver size. Subcentimeter hypodense right liver lesion is too small to characterize and is stable. No new liver lesions. Normal gallbladder with no radiopaque cholelithiasis. No biliary ductal dilatation. Pancreas: Stable coarse calcifications in the pancreatic head compatible with chronic pancreatitis. No pancreatic mass or duct dilation. Spleen: Normal size. No mass. Adrenals/Urinary Tract: Normal adrenals. Simple exophytic 5.0 cm posterior upper left renal cyst. Several subcentimeter hypodense renal cortical lesions in both kidneys are too small to characterize and are unchanged. No hydronephrosis. Collapsed bladder without acute abnormality. Stomach/Bowel: Moderate hiatal hernia. Stomach is nondistended. Mass-effect on the stomach by ascitic fluid in the lesser omentum. Normal caliber small bowel with no small bowel wall thickening. Appendix not discretely visualized. Oral contrast transits to the rectum. Normal large bowel with no diverticulosis, large bowel wall thickening or pericolonic fat stranding. Vascular/Lymphatic: Atherosclerotic nonaneurysmal abdominal aorta. Patent portal, splenic, hepatic and renal veins. No pathologically enlarged lymph nodes in the abdomen or pelvis. Reproductive: Stable heterogeneous uterus. No discrete adnexal masses. Other: Large volume ascites extending into small to moderate right inguinal hernia and small umbilical hernia, increased in volume. No pneumoperitoneum. There is diffuse irregular peritoneal  thickening and hyperenhancement throughout the abdomen and pelvis, which is worsened in the interval. Representative anterior pelvic 1.7 cm implant (series 2/image 90), previously 1.2 cm on 01/31/2019 CT. Left pelvic sidewall peritoneal thickening up to 0.9 cm (series 2/image 104),  previously 0.7 cm. Left upper quadrant peritoneal thickness 1.7 cm (series 2/image 59), previously 1.3 cm. Right lower quadrant peritoneal thickness 1.0 cm (series 2/image 93), previously 0.7 cm. Mild anasarca. Musculoskeletal: No aggressive appearing focal osseous lesions. Moderate lower lumbar spondylosis. IMPRESSION: 1. Acute bilateral pulmonary embolism, extending proximally to the lobar level. 2. Positive for acute PE with CT evidence of right heart strain (RV/LV Ratio = 1.3) consistent with at least submassive (intermediate risk) PE. The presence of right heart strain has been associated with an increased risk of morbidity and mortality. 3. Mediastinal adenopathy is stable. Stable trace dependent right pleural effusion. No new or progressive metastatic disease in the chest. 4. Large volume ascites is increased. Diffuse irregular peritoneal thickening and hyperenhancement has worsened, compatible with progressive diffuse peritoneal carcinomatosis. 5. Chronic findings include: Aortic Atherosclerosis (ICD10-I70.0) and Emphysema (ICD10-J43.9). Left main and 3 vessel coronary atherosclerosis. Moderate hiatal hernia. Chronic pancreatitis. Critical Value/emergent results were called by telephone at the time of interpretation on 04/19/2019 at 3:59 pm to Dr. Heath Lark , who verbally acknowledged these results. Electronically Signed   By: Ilona Sorrel M.D.   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 Restaging. Paracentesis earlier today. EXAM: CT CHEST, ABDOMEN, AND PELVIS WITH CONTRAST TECHNIQUE: Multidetector CT imaging of the chest, abdomen and pelvis was performed following the standard protocol during bolus administration of intravenous contrast. CONTRAST:  146mL OMNIPAQUE IOHEXOL 300 MG/ML  SOLN COMPARISON:  01/31/2019 CT chest, abdomen and pelvis. FINDINGS: CT CHEST  FINDINGS Cardiovascular: Normal heart size. No significant pericardial effusion/thickening. Left main and 3 vessel coronary atherosclerosis. Right internal jugular Port-A-Cath terminates in the lower third of the SVC. Atherosclerotic nonaneurysmal thoracic aorta. Normal caliber pulmonary arteries. Bilateral acute pulmonary embolism involving lobar, segmental and subsegmental branches. RV/LV ratio 1.3. Mediastinum/Nodes: Right hemithyroidectomy. No left thyroid nodules. Patulous thoracic esophagus filled with oral contrast. No axillary adenopathy. Mildly enlarged 1.0 cm right pericardiophrenic node, stable. Prevascular adenopathy measuring up to 1.1 cm (series 2/image 23), stable. No new pathologically enlarged mediastinal nodes. No hilar adenopathy. Lungs/Pleura: No pneumothorax. Trace dependent right pleural effusion, stable. No left pleural effusion. Moderate centrilobular emphysema. Status post right lower lobectomy. Stable peripheral right upper lobe 4 mm solid pulmonary nodule (series 4/image 62). No acute consolidative airspace disease, lung masses or new significant pulmonary nodules. Musculoskeletal: No aggressive appearing focal osseous lesions. Stable post thoracotomy change in the posterior right ribs. CT ABDOMEN PELVIS FINDINGS Hepatobiliary: Normal liver size. Subcentimeter hypodense right liver lesion is too small to characterize and is stable. No new liver lesions. Normal gallbladder with no radiopaque cholelithiasis. No biliary ductal dilatation. Pancreas: Stable coarse calcifications in the pancreatic head compatible with chronic pancreatitis. No pancreatic mass or duct dilation. Spleen: Normal size. No mass. Adrenals/Urinary Tract: Normal adrenals. Simple exophytic 5.0 cm posterior upper left renal cyst. Several subcentimeter hypodense renal cortical lesions in both kidneys are too small to characterize and are unchanged. No hydronephrosis. Collapsed bladder without acute abnormality.  Stomach/Bowel: Moderate hiatal hernia. Stomach is nondistended. Mass-effect on the stomach by ascitic fluid in the lesser omentum. Normal caliber small bowel with no small bowel wall  thickening. Appendix not discretely visualized. Oral contrast transits to the rectum. Normal large bowel with no diverticulosis, large bowel wall thickening or pericolonic fat stranding. Vascular/Lymphatic: Atherosclerotic nonaneurysmal abdominal aorta. Patent portal, splenic, hepatic and renal veins. No pathologically enlarged lymph nodes in the abdomen or pelvis. Reproductive: Stable heterogeneous uterus. No discrete adnexal masses. Other: Large volume ascites extending into small to moderate right inguinal hernia and small umbilical hernia, increased in volume. No pneumoperitoneum. There is diffuse irregular peritoneal thickening and hyperenhancement throughout the abdomen and pelvis, which is worsened in the interval. Representative anterior pelvic 1.7 cm implant (series 2/image 90), previously 1.2 cm on 01/31/2019 CT. Left pelvic sidewall peritoneal thickening up to 0.9 cm (series 2/image 104), previously 0.7 cm. Left upper quadrant peritoneal thickness 1.7 cm (series 2/image 59), previously 1.3 cm. Right lower quadrant peritoneal thickness 1.0 cm (series 2/image 93), previously 0.7 cm. Mild anasarca. Musculoskeletal: No aggressive appearing focal osseous lesions. Moderate lower lumbar spondylosis. IMPRESSION: 1. Acute bilateral pulmonary embolism, extending proximally to the lobar level. 2. Positive for acute PE with CT evidence of right heart strain (RV/LV Ratio = 1.3) consistent with at least submassive (intermediate risk) PE. The presence of right heart strain has been associated with an increased risk of morbidity and mortality. 3. Mediastinal adenopathy is stable. Stable trace dependent right pleural effusion. No new or progressive metastatic disease in the chest. 4. Large volume ascites is increased. Diffuse irregular  peritoneal thickening and hyperenhancement has worsened, compatible with progressive diffuse peritoneal carcinomatosis. 5. Chronic findings include: Aortic Atherosclerosis (ICD10-I70.0) and Emphysema (ICD10-J43.9). Left main and 3 vessel coronary atherosclerosis. Moderate hiatal hernia. Chronic pancreatitis. Critical Value/emergent results were called by telephone at the time of interpretation on 04/19/2019 at 3:59 pm to Dr. Heath Lark , who verbally acknowledged these results. Electronically Signed   By: Ilona Sorrel M.D.   On: 04/19/2019 16:05   US Paracentesis  Result Date: 04/19/2019 INDICATION: Malignant ascites. EXAM: ULTRASOUND GUIDED therapeutic PARACENTESIS MEDICATIONS: None. COMPLICATIONS: None immediate. PROCEDURE: Informed written consent was obtained from the patient after a discussion of the risks, benefits and alternatives to treatment. A timeout was performed prior to the initiation of the procedure. Initial ultrasound scanning demonstrates a large amount of ascites within the right lower abdominal quadrant. The right lower abdomen was prepped and draped in the usual sterile fashion. 1% lidocaine with epinephrine was used for local anesthesia. Following this, a paracentesis catheter was introduced. An ultrasound image was saved for documentation purposes. The paracentesis was performed. The catheter was removed and a dressing was applied. The patient tolerated the procedure well without immediate post procedural complication. FINDINGS: A total of approximately 5 L of serosanguineous fluid was removed. IMPRESSION: Successful ultrasound-guided paracentesis yielding 5 liters of peritoneal fluid. Electronically Signed   By: Marijo Conception M.D.   On: 04/19/2019 11:31   US Paracentesis  Result Date: 04/06/2019 INDICATION: Patient with history of ovarian cancer, recurrent malignant ascites, chronic kidney; request received therapeutic paracentesis up to 5 liters. EXAM: ULTRASOUND GUIDED THERAPEUTIC  PARACENTESIS MEDICATIONS: None COMPLICATIONS: None immediate. PROCEDURE: Informed written consent was obtained from the patient after a discussion of the risks, benefits and alternatives to treatment. A timeout was performed prior to the initiation of the procedure. Initial ultrasound scanning demonstrates a large amount of ascites within the right mid to lower abdominal quadrant. The right mid to lower abdomen was prepped and draped in the usual sterile fashion. 1% lidocaine was used for local anesthesia. Following this, a 19 gauge,  10-cm, Yueh catheter was introduced. An ultrasound image was saved for documentation purposes. The paracentesis was performed. The catheter was removed and a dressing was applied. The patient tolerated the procedure well without immediate post procedural complication. FINDINGS: A total of approximately 5 liters of bloody fluid was removed. IMPRESSION: Successful ultrasound-guided therapeutic paracentesis yielding 5 liters of peritoneal fluid. Dr. [name] was notified of above findings. Read by: Rowe Robert, PA-C Electronically Signed   By: Markus Daft M.D.   On: 04/06/2019 17:34   Vas [place] Lower Extremity Venous (dvt)  Result Date: 04/22/2019  Lower Venous Study Indications: Pulmonary embolism.  Performing Technologist: Maudry Mayhew MHA, RDMS, RVT, RDCS  Examination Guidelines: A complete evaluation includes B-mode imaging, spectral Doppler, color Doppler, and power Doppler as needed of all accessible portions of each vessel. Bilateral testing is considered an integral part of a complete examination. Limited examinations for reoccurring indications may be performed as noted.  +---------+---------------+---------+-----------+----------+-------+ RIGHT    CompressibilityPhasicitySpontaneityPropertiesSummary +---------+---------------+---------+-----------+----------+-------+ CFV      Full           Yes      Yes                           +---------+---------------+---------+-----------+----------+-------+ SFJ      Full                                                 +---------+---------------+---------+-----------+----------+-------+ FV Prox  Full                                                 +---------+---------------+---------+-----------+----------+-------+ FV Mid   Full                                                 +---------+---------------+---------+-----------+----------+-------+ FV DistalFull                                                 +---------+---------------+---------+-----------+----------+-------+ PFV      Full                                                 +---------+---------------+---------+-----------+----------+-------+ POP      Full           Yes      Yes                          +---------+---------------+---------+-----------+----------+-------+ PTV      Full                                                 +---------+---------------+---------+-----------+----------+-------+ PERO  Full                                                 +---------+---------------+---------+-----------+----------+-------+   +---------+---------------+---------+-----------+----------+-------+ LEFT     CompressibilityPhasicitySpontaneityPropertiesSummary +---------+---------------+---------+-----------+----------+-------+ CFV      Full           Yes      Yes                          +---------+---------------+---------+-----------+----------+-------+ SFJ      Full                                                 +---------+---------------+---------+-----------+----------+-------+ FV Prox  Full                                                 +---------+---------------+---------+-----------+----------+-------+ FV Mid   Full                                                 +---------+---------------+---------+-----------+----------+-------+ FV DistalFull                                                  +---------+---------------+---------+-----------+----------+-------+ PFV      Full                                                 +---------+---------------+---------+-----------+----------+-------+ POP      Full           Yes      Yes                          +---------+---------------+---------+-----------+----------+-------+ PTV      Full                                                 +---------+---------------+---------+-----------+----------+-------+ PERO     Full                                                 +---------+---------------+---------+-----------+----------+-------+     Summary: Right: There is no evidence of deep vein thrombosis in the lower extremity. No cystic structure found in the popliteal fossa. Left: There is no evidence of deep vein thrombosis in the lower extremity. No cystic structure found in the popliteal fossa.  *See table(s) above for measurements and observations. Electronically signed by  Ruta Hinds MD on 04/22/2019 at 12:17:06 PM.    Final

## 2019-04-23 NOTE — Progress Notes (Signed)
ANTICOAGULATION CONSULT NOTE - Follow Up Consult  Pharmacy Consult for heparin Indication: acute pulmonary embolus  No Known Allergies  Patient Measurements: Height: 5\' 5"  (165.1 cm) Weight: 136 lb 14.5 oz (62.1 kg) IBW/kg (Calculated) : 57 Heparin Dosing Weight: 62 kg  Vital Signs: Temp: 98.2 F (36.8 C) (08/17 0459) Temp Source: Oral (08/16 2021) BP: 102/58 (08/17 0459) Pulse Rate: 84 (08/17 0459)  Labs: Recent Labs    04/20/19 1136  04/21/19 0743 04/22/19 0216 04/22/19 1811 04/23/19 0523  HGB 7.3*   < > 7.1* 6.3* 8.1* 7.7*  HCT 23.1*   < > 22.9* 20.6* 25.3* 24.7*  PLT 312  --  294 273  --  264  LABPROT  --   --   --   --   --  13.1  INR  --   --   --   --   --  1.0  HEPARINUNFRC 1.00*   < > 0.58 0.41  --  0.39  CREATININE 1.86*  --  1.32*  --   --  1.11*   < > = values in this interval not displayed.    Estimated Creatinine Clearance: 43.6 mL/min (A) (by C-G formula based on SCr of 1.11 mg/dL (H)).   Assessment: Patient is a 68 y.o with ovarian cancer and recurrent ascites presented to the ED on 8/13 with bilateral PE noted on outpatient CT scan.  LE doppler on 8/14 was negative for DVT.  Heparin drip started on admission for acute PE.  - Per Dr. Calton Dach note on 8/14, recom. to eventually transition patient to Lovenox or DOAC. If to proceed with Eliquis, she recom. to start once pt has had drainage catheter placed in her abdomen.  Today, 04/23/2019: - heparin level remains therapeutic at 0.39 - Hgb low at 7.7 - plts ok - no bleeding noted  Goal of Therapy:  Heparin level 0.3-0.7 units/ml Monitor platelets by anticoagulation protocol: Yes   Plan:  - continue heparin drip at 700 units/hr - daily heparin level - monitor for s/s bleeding - planning to hold heparin 2 to 4 hours pre drain placement 8/17  Thank you Dolly Rias RPh 04/23/2019, 7:49 AM Pager [phone]

## 2019-04-23 NOTE — TOC Transition Note (Signed)
Transition of Care [facility]) - CM/SW Discharge Note   Patient Details  Name: Christy Ellis MRN: [id number] Date of Birth: 12-Mar-1951  Transition of Care [facility]) CM/SW Contact:  Wende Neighbors, LCSW Phone Number: 04/23/2019, 2:42 PM   Clinical Narrative:  Patient has been set up with [place] [facility] to follow her in the home. Patient will discharge home with 10 canisters for her pheurix drain.   CSW faxed over pleural drainage supply form to [facility] and requested it be filed out and sent to supplier so patient can receive more supplies at home. Per family member they will transport patient home    Final next level of care: Home w Hospice Care Barriers to Discharge: No Barriers Identified   Patient Goals and CMS Choice Patient states their goals for this hospitalization and ongoing recovery are:: to go home CMS Medicare.gov Compare Post Acute Care list provided to:: Patient Choice offered to / list presented to : Patient  Discharge Placement                Patient to be transferred to facility by: family Name of family member notified: son at bedside Patient and family notified of of transfer: 04/23/19  Discharge Plan and Services     Post Acute Care Choice: (home with hospice)                      [place] Date [place]: 04/23/19 Time [place]: [place] Representative spoke with at [place]: casandra  Social Determinants of Health ([place]) Interventions     Readmission Risk Interventions No flowsheet data found.

## 2019-04-23 NOTE — Care Management Important Message (Signed)
Important Message  Patient Details IM Letter given to Rhea Pink SW to present to the Patient Name: Christy Ellis MRN: [id number] Date of Birth: October 22, 1950   Medicare Important Message Given:  Yes     Kerin Salen 04/23/2019, 1:27 PM

## 2019-04-23 NOTE — Telephone Encounter (Signed)
Referral called in for [facility]

## 2019-04-23 NOTE — Discharge Summary (Signed)
Physician Discharge Summary  Christy Ellis PRF:[id number] DOB: 01-09-1951 DOA: 04/19/2019  PCP: Caryl Bis, MD  Admit date: 04/19/2019 Discharge date: 04/23/2019  Admitted From: Home.  Disposition:  Home with home hospice.   Recommendations for Outpatient Follow-up:  Follow up with DR [facility] as recommended    Discharge Condition: home hospice.  CODE STATUS:DNR.  Diet recommendation: Heart Healthy  Brief/Interim Summary: 68 year old lady with prior history of metastatic ovarian cancer stage III CKD, type 2 diabetes mellitus, hypertension, hyperlipidemia history of non-small cell lung cancer, hypothyroidism and gout presents with progressive shortness of breath and abdominal distention.  She was found to have bilateral PE and Diffuse irregular peritoneal thickening and hyperenhancement has worsened, compatible with progressive diffuse peritoneal carcinomatosis and worsening ascites    Oncology on board.  She was started on IV heparin for bilateral acute PE. Oncology had a goals of care meeting with the patient and her son and transition to home hospice on discharge.  But in view of her recurrent ascites and weekly paracentesis we have requested IR to put a drainage catheter for palliative purposes.  IV heparin was transitioned to low dose eliquis on discharge as per oncology recommendations.   Discharge Diagnoses:  Principal Problem:   Pulmonary embolism and infarction [facility]) Active Problems:   Ovarian cancer ([place])   Essential hypertension   History of lung cancer   Diabetes mellitus without complication ([place])   CKD (chronic kidney disease), stage III ([place])  Ovarian cancer with progressive diffuse peritoneal carcinomatosis and ascites Patient follows up with Dr. [name] as outpatient.  Currently she is off chemotherapy. In view of her worsening malignancy and new peritoneal carcinomatosis and a bilateral PE oncologist had goals of care meeting with the patient and the son and  transition to home hospice on discharge.  But in view of her recurrent ascites and weekly paracentesis we have requested IR to put a drainage catheter for palliative purposes. On 8/17  peritoneal drainage catheter placed and she was discharged home.  Malignant ascites S/p paracentesis on 04/19/2019. Marland Kitchen US Paracentesis  done at the time of peritoneal drainage catheter placement. bloody fluid >8 lit taken out. Peritoneal catheter placed for palliative purposes.    Bilateral acute pulmonary embolism Started the patient on IV heparin, transitioned to oral Eliquis on discharge after discussing with oncology. Echocardiogram ordered, does not reveal right heart strain.    Essential hypertension Well-controlled   Stage III CKD Creatinine appears to be better than baseline    Type 2 diabetes mellitus A1c is 5.9. Diet controlled. Stopped metformin on discharge.    Anemia of chronic disease/ anemia of blood loss? Into peritoneal cavity.  Patient's hemoglobin dropped to 6.3. s/p 1 unit of prbc transfusion. Repeat H&H greater than 7.     Mild hyperkalemia Resolved with hydration.   Hyperlipidemia Continue with statin    Discharge Instructions  Discharge Instructions    Discharge instructions   Complete by: As directed    Please follow up with Dr [name] as recommended.     Allergies as of 04/23/2019   No Known Allergies     Medication List    STOP taking these medications   aspirin EC 81 MG tablet   metFORMIN 500 MG 24 hr tablet Commonly known as: GLUCOPHAGE-XR     TAKE these medications   apixaban 2.5 MG Tabs tablet Commonly known as: Eliquis Take 1 tablet (2.5 mg total) by mouth 2 (two) times daily.   atorvastatin 80 MG tablet Commonly  known as: LIPITOR Take 80 mg by mouth daily.   cloNIDine 0.2 MG tablet Commonly known as: CATAPRES Take 0.2 mg by mouth 2 (two) times daily.   feeding supplement (ENSURE ENLIVE) Liqd Take 237 mLs by  mouth 2 (two) times daily between meals.   levothyroxine 100 MCG tablet Commonly known as: SYNTHROID Take 100 mcg by mouth daily before breakfast.   lidocaine-prilocaine cream Commonly known as: EMLA Apply to affected area once   ondansetron 8 MG tablet Commonly known as: Zofran Take 1 tablet (8 mg total) by mouth every 8 (eight) hours as needed for refractory nausea / vomiting. Start on day 3 after chemo.   prochlorperazine 10 MG tablet Commonly known as: COMPAZINE Take 1 tablet (10 mg total) by mouth every 6 (six) hours as needed (Nausea or vomiting).   VITAMIN D3 PO Take 1,000 Units by mouth daily.   Zyloprim 300 MG tablet Generic drug: allopurinol Take 300 mg by mouth daily.      Follow-up Information    Caryl Bis, MD. Schedule an appointment as soon as possible for a visit in 1 week(s).   Specialty: Family Medicine Contact information: [place] [id number] [phone]          No Known Allergies  Consultations:  Oncology  IR.    Procedures/Studies: Ct Chest W Contrast  Result Date: 04/19/2019 CLINICAL DATA:  Stage IV papillary serous ovarian cancer diagnosed December 2019 with ongoing chemotherapy. Restaging. Paracentesis earlier today. EXAM: CT CHEST, ABDOMEN, AND PELVIS WITH CONTRAST TECHNIQUE: Multidetector CT imaging of the chest, abdomen and pelvis was performed following the standard protocol during bolus administration of intravenous contrast. CONTRAST:  157mL OMNIPAQUE IOHEXOL 300 MG/ML  SOLN COMPARISON:  01/31/2019 CT chest, abdomen and pelvis. FINDINGS: CT CHEST FINDINGS Cardiovascular: Normal heart size. No significant pericardial effusion/thickening. Left main and 3 vessel coronary atherosclerosis. Right internal jugular Port-A-Cath terminates in the lower third of the SVC. Atherosclerotic nonaneurysmal thoracic aorta. Normal caliber pulmonary arteries. Bilateral acute pulmonary embolism involving lobar, segmental and subsegmental  branches. RV/LV ratio 1.3. Mediastinum/Nodes: Right hemithyroidectomy. No left thyroid nodules. Patulous thoracic esophagus filled with oral contrast. No axillary adenopathy. Mildly enlarged 1.0 cm right pericardiophrenic node, stable. Prevascular adenopathy measuring up to 1.1 cm (series 2/image 23), stable. No new pathologically enlarged mediastinal nodes. No hilar adenopathy. Lungs/Pleura: No pneumothorax. Trace dependent right pleural effusion, stable. No left pleural effusion. Moderate centrilobular emphysema. Status post right lower lobectomy. Stable peripheral right upper lobe 4 mm solid pulmonary nodule (series 4/image 62). No acute consolidative airspace disease, lung masses or new significant pulmonary nodules. Musculoskeletal: No aggressive appearing focal osseous lesions. Stable post thoracotomy change in the posterior right ribs. CT ABDOMEN PELVIS FINDINGS Hepatobiliary: Normal liver size. Subcentimeter hypodense right liver lesion is too small to characterize and is stable. No new liver lesions. Normal gallbladder with no radiopaque cholelithiasis. No biliary ductal dilatation. Pancreas: Stable coarse calcifications in the pancreatic head compatible with chronic pancreatitis. No pancreatic mass or duct dilation. Spleen: Normal size. No mass. Adrenals/Urinary Tract: Normal adrenals. Simple exophytic 5.0 cm posterior upper left renal cyst. Several subcentimeter hypodense renal cortical lesions in both kidneys are too small to characterize and are unchanged. No hydronephrosis. Collapsed bladder without acute abnormality. Stomach/Bowel: Moderate hiatal hernia. Stomach is nondistended. Mass-effect on the stomach by ascitic fluid in the lesser omentum. Normal caliber small bowel with no small bowel wall thickening. Appendix not discretely visualized. Oral contrast transits to the rectum. Normal large bowel  with no diverticulosis, large bowel wall thickening or pericolonic fat stranding. Vascular/Lymphatic:  Atherosclerotic nonaneurysmal abdominal aorta. Patent portal, splenic, hepatic and renal veins. No pathologically enlarged lymph nodes in the abdomen or pelvis. Reproductive: Stable heterogeneous uterus. No discrete adnexal masses. Other: Large volume ascites extending into small to moderate right inguinal hernia and small umbilical hernia, increased in volume. No pneumoperitoneum. There is diffuse irregular peritoneal thickening and hyperenhancement throughout the abdomen and pelvis, which is worsened in the interval. Representative anterior pelvic 1.7 cm implant (series 2/image 90), previously 1.2 cm on 01/31/2019 CT. Left pelvic sidewall peritoneal thickening up to 0.9 cm (series 2/image 104), previously 0.7 cm. Left upper quadrant peritoneal thickness 1.7 cm (series 2/image 59), previously 1.3 cm. Right lower quadrant peritoneal thickness 1.0 cm (series 2/image 93), previously 0.7 cm. Mild anasarca. Musculoskeletal: No aggressive appearing focal osseous lesions. Moderate lower lumbar spondylosis. IMPRESSION: 1. Acute bilateral pulmonary embolism, extending proximally to the lobar level. 2. Positive for acute PE with CT evidence of right heart strain (RV/LV Ratio = 1.3) consistent with at least submassive (intermediate risk) PE. The presence of right heart strain has been associated with an increased risk of morbidity and mortality. 3. Mediastinal adenopathy is stable. Stable trace dependent right pleural effusion. No new or progressive metastatic disease in the chest. 4. Large volume ascites is increased. Diffuse irregular peritoneal thickening and hyperenhancement has worsened, compatible with progressive diffuse peritoneal carcinomatosis. 5. Chronic findings include: Aortic Atherosclerosis (ICD10-I70.0) and Emphysema (ICD10-J43.9). Left main and 3 vessel coronary atherosclerosis. Moderate hiatal hernia. Chronic pancreatitis. Critical Value/emergent results were called by telephone at the time of interpretation  on 04/19/2019 at 3:59 pm to Dr. Heath Lark , who verbally acknowledged these results. Electronically Signed   By: Ilona Sorrel M.D.   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 Restaging. Paracentesis earlier today. EXAM: CT CHEST, ABDOMEN, AND PELVIS WITH CONTRAST TECHNIQUE: Multidetector CT imaging of the chest, abdomen and pelvis was performed following the standard protocol during bolus administration of intravenous contrast. CONTRAST:  129mL OMNIPAQUE IOHEXOL 300 MG/ML  SOLN COMPARISON:  01/31/2019 CT chest, abdomen and pelvis. FINDINGS: CT CHEST FINDINGS Cardiovascular: Normal heart size. No significant pericardial effusion/thickening. Left main and 3 vessel coronary atherosclerosis. Right internal jugular Port-A-Cath terminates in the lower third of the SVC. Atherosclerotic nonaneurysmal thoracic aorta. Normal caliber pulmonary arteries. Bilateral acute pulmonary embolism involving lobar, segmental and subsegmental branches. RV/LV ratio 1.3. Mediastinum/Nodes: Right hemithyroidectomy. No left thyroid nodules. Patulous thoracic esophagus filled with oral contrast. No axillary adenopathy. Mildly enlarged 1.0 cm right pericardiophrenic node, stable. Prevascular adenopathy measuring up to 1.1 cm (series 2/image 23), stable. No new pathologically enlarged mediastinal nodes. No hilar adenopathy. Lungs/Pleura: No pneumothorax. Trace dependent right pleural effusion, stable. No left pleural effusion. Moderate centrilobular emphysema. Status post right lower lobectomy. Stable peripheral right upper lobe 4 mm solid pulmonary nodule (series 4/image 62). No acute consolidative airspace disease, lung masses or new significant pulmonary nodules. Musculoskeletal: No aggressive appearing focal osseous lesions. Stable post thoracotomy change in the posterior right ribs. CT ABDOMEN  PELVIS FINDINGS Hepatobiliary: Normal liver size. Subcentimeter hypodense right liver lesion is too small to characterize and is stable. No new liver lesions. Normal gallbladder with no radiopaque cholelithiasis. No biliary ductal dilatation. Pancreas: Stable coarse calcifications in the pancreatic head compatible with chronic pancreatitis. No pancreatic mass or duct dilation. Spleen: Normal  size. No mass. Adrenals/Urinary Tract: Normal adrenals. Simple exophytic 5.0 cm posterior upper left renal cyst. Several subcentimeter hypodense renal cortical lesions in both kidneys are too small to characterize and are unchanged. No hydronephrosis. Collapsed bladder without acute abnormality. Stomach/Bowel: Moderate hiatal hernia. Stomach is nondistended. Mass-effect on the stomach by ascitic fluid in the lesser omentum. Normal caliber small bowel with no small bowel wall thickening. Appendix not discretely visualized. Oral contrast transits to the rectum. Normal large bowel with no diverticulosis, large bowel wall thickening or pericolonic fat stranding. Vascular/Lymphatic: Atherosclerotic nonaneurysmal abdominal aorta. Patent portal, splenic, hepatic and renal veins. No pathologically enlarged lymph nodes in the abdomen or pelvis. Reproductive: Stable heterogeneous uterus. No discrete adnexal masses. Other: Large volume ascites extending into small to moderate right inguinal hernia and small umbilical hernia, increased in volume. No pneumoperitoneum. There is diffuse irregular peritoneal thickening and hyperenhancement throughout the abdomen and pelvis, which is worsened in the interval. Representative anterior pelvic 1.7 cm implant (series 2/image 90), previously 1.2 cm on 01/31/2019 CT. Left pelvic sidewall peritoneal thickening up to 0.9 cm (series 2/image 104), previously 0.7 cm. Left upper quadrant peritoneal thickness 1.7 cm (series 2/image 59), previously 1.3 cm. Right lower quadrant peritoneal thickness 1.0 cm  (series 2/image 93), previously 0.7 cm. Mild anasarca. Musculoskeletal: No aggressive appearing focal osseous lesions. Moderate lower lumbar spondylosis. IMPRESSION: 1. Acute bilateral pulmonary embolism, extending proximally to the lobar level. 2. Positive for acute PE with CT evidence of right heart strain (RV/LV Ratio = 1.3) consistent with at least submassive (intermediate risk) PE. The presence of right heart strain has been associated with an increased risk of morbidity and mortality. 3. Mediastinal adenopathy is stable. Stable trace dependent right pleural effusion. No new or progressive metastatic disease in the chest. 4. Large volume ascites is increased. Diffuse irregular peritoneal thickening and hyperenhancement has worsened, compatible with progressive diffuse peritoneal carcinomatosis. 5. Chronic findings include: Aortic Atherosclerosis (ICD10-I70.0) and Emphysema (ICD10-J43.9). Left main and 3 vessel coronary atherosclerosis. Moderate hiatal hernia. Chronic pancreatitis. Critical Value/emergent results were called by telephone at the time of interpretation on 04/19/2019 at 3:59 pm to Dr. Heath Lark , who verbally acknowledged these results. Electronically Signed   By: Ilona Sorrel M.D.   On: 04/19/2019 16:05   US Paracentesis  Result Date: 04/19/2019 INDICATION: Malignant ascites. EXAM: ULTRASOUND GUIDED therapeutic PARACENTESIS MEDICATIONS: None. COMPLICATIONS: None immediate. PROCEDURE: Informed written consent was obtained from the patient after a discussion of the risks, benefits and alternatives to treatment. A timeout was performed prior to the initiation of the procedure. Initial ultrasound scanning demonstrates a large amount of ascites within the right lower abdominal quadrant. The right lower abdomen was prepped and draped in the usual sterile fashion. 1% lidocaine with epinephrine was used for local anesthesia. Following this, a paracentesis catheter was introduced. An ultrasound image  was saved for documentation purposes. The paracentesis was performed. The catheter was removed and a dressing was applied. The patient tolerated the procedure well without immediate post procedural complication. FINDINGS: A total of approximately 5 L of serosanguineous fluid was removed. IMPRESSION: Successful ultrasound-guided paracentesis yielding 5 liters of peritoneal fluid. Electronically Signed   By: Marijo Conception M.D.   On: 04/19/2019 11:31   US Paracentesis  Result Date: 04/06/2019 INDICATION: Patient with history of ovarian cancer, recurrent malignant ascites, chronic kidney; request received therapeutic paracentesis up to 5 liters. EXAM: ULTRASOUND GUIDED THERAPEUTIC PARACENTESIS MEDICATIONS: None COMPLICATIONS: None immediate. PROCEDURE: Informed written consent was obtained from the  patient after a discussion of the risks, benefits and alternatives to treatment. A timeout was performed prior to the initiation of the procedure. Initial ultrasound scanning demonstrates a large amount of ascites within the right mid to lower abdominal quadrant. The right mid to lower abdomen was prepped and draped in the usual sterile fashion. 1% lidocaine was used for local anesthesia. Following this, a 19 gauge, 10-cm, Yueh catheter was introduced. An ultrasound image was saved for documentation purposes. The paracentesis was performed. The catheter was removed and a dressing was applied. The patient tolerated the procedure well without immediate post procedural complication. FINDINGS: A total of approximately 5 liters of bloody fluid was removed. IMPRESSION: Successful ultrasound-guided therapeutic paracentesis yielding 5 liters of peritoneal fluid. Dr. [name] was notified of above findings. Read by: Rowe Robert, PA-C Electronically Signed   By: Markus Daft M.D.   On: 04/06/2019 17:34   Ir Perc Athena Masse Perit Cath Wo Port  Result Date: 04/23/2019 INDICATION: History of metastatic ovarian cancer now with recurrent  symptomatic malignant ascites. Please perform image guided placement of a tunneled peritoneal drainage catheter for palliative purposes. EXAM: ULTRASOUND AND FLUOROSCOPIC GUIDED PLACEMENT OF TUNNELED PERITONEAL CATHETER COMPARISON:  None. MEDICATIONS: Ancef 2 gm IV; Antibiotics were administered within an appropriate time frame prior to skin puncture. CONTRAST:  None. ANESTHESIA/SEDATION: Moderate (conscious) sedation was employed during this procedure. A total of Versed 2 mg and Fentanyl 100 mcg was administered intravenously. Moderate Sedation Time: 14 minutes. The patient's level of consciousness and vital signs were monitored continuously by radiology nursing throughout the procedure under my direct supervision. FLUOROSCOPY TIME:  18 seconds (14 mGy) COMPLICATIONS: None immediate. TECHNIQUE: Informed written consent was obtained from the patient after a discussion of the risks, benefits and alternatives to treatment. Questions regarding the procedure were encouraged and answered. A timeout was performed prior to the initiation of the procedure. Ultrasound scanning of the right lower abdominal quadrant demonstrates a moderate amount of complex fluid within the right lower abdominal quadrant. The right lower abdomen was prepped and draped usual sterile fashion a sterile drape was applied, covering the operative table. Maximum barrier sterile technique with sterile gowns and gloves were used for the procedure. A timeout was performed prior to the initiation of the procedure. Local anesthesia was provided with 1% lidocaine with epinephrine. Under direct ultrasound guidance, an 18 gauge trocar needle was advanced into the peritoneal space with the right abdominal quadrant. Appropriate positioning was confirmed with the efflux of ascites. An Amplatz super stiff wire was advanced across the abdomen under intermittent fluoroscopic guidance. The access site was dilated over the Amplatz wire, allowing advancement of a  peel-away sheath. The peritoneal catheter was tunneled in an antegrade fashion from a site along the right mid clavicular line to the access site and inserted through the peel-away sheath with tip ultimately terminating within left lower abdomen/pelvis. Several postprocedural spot abdominal radiographs were obtained. The catheter was connected to wall suction and approximately 8.5 L of bloody ascites was aspirated. The access site was closed with an interrupted 4-0 Vicryl suture, Dermabond and Steri-Strips. Dressings were placed. The patient tolerated procedure well without immediate postprocedural complication. FINDINGS: After successful ultrasound and fluoroscopic guided peritoneal drainage catheter placement, the right lower quadrant abdominal approach drainage catheter terminates within the left lower abdomen/pelvis. Following tunneled peritoneal drainage catheter placement, approximately 8.5 L of bloody ascitic fluid was aspirated. IMPRESSION: 1. Successful ultrasound and fluoroscopic guided placement of a tunneled peritoneal drainage catheter. 2. Successful aspiration of  8.5 L of bloody ascitic fluid following tunneled peritoneal drainage catheter placement. Electronically Signed   By: Sandi Mariscal M.D.   On: 04/23/2019 14:30   Vas [place] Lower Extremity Venous (dvt)  Result Date: 04/22/2019  Lower Venous Study Indications: Pulmonary embolism.  Performing Technologist: Maudry Mayhew MHA, RDMS, RVT, RDCS  Examination Guidelines: A complete evaluation includes B-mode imaging, spectral Doppler, color Doppler, and power Doppler as needed of all accessible portions of each vessel. Bilateral testing is considered an integral part of a complete examination. Limited examinations for reoccurring indications may be performed as noted.  +---------+---------------+---------+-----------+----------+-------+ RIGHT    CompressibilityPhasicitySpontaneityPropertiesSummary  +---------+---------------+---------+-----------+----------+-------+ CFV      Full           Yes      Yes                          +---------+---------------+---------+-----------+----------+-------+ SFJ      Full                                                 +---------+---------------+---------+-----------+----------+-------+ FV Prox  Full                                                 +---------+---------------+---------+-----------+----------+-------+ FV Mid   Full                                                 +---------+---------------+---------+-----------+----------+-------+ FV DistalFull                                                 +---------+---------------+---------+-----------+----------+-------+ PFV      Full                                                 +---------+---------------+---------+-----------+----------+-------+ POP      Full           Yes      Yes                          +---------+---------------+---------+-----------+----------+-------+ PTV      Full                                                 +---------+---------------+---------+-----------+----------+-------+ PERO     Full                                                 +---------+---------------+---------+-----------+----------+-------+   +---------+---------------+---------+-----------+----------+-------+ LEFT     CompressibilityPhasicitySpontaneityPropertiesSummary +---------+---------------+---------+-----------+----------+-------+  CFV      Full           Yes      Yes                          +---------+---------------+---------+-----------+----------+-------+ SFJ      Full                                                 +---------+---------------+---------+-----------+----------+-------+ FV Prox  Full                                                 +---------+---------------+---------+-----------+----------+-------+ FV Mid   Full                                                  +---------+---------------+---------+-----------+----------+-------+ FV DistalFull                                                 +---------+---------------+---------+-----------+----------+-------+ PFV      Full                                                 +---------+---------------+---------+-----------+----------+-------+ POP      Full           Yes      Yes                          +---------+---------------+---------+-----------+----------+-------+ PTV      Full                                                 +---------+---------------+---------+-----------+----------+-------+ PERO     Full                                                 +---------+---------------+---------+-----------+----------+-------+     Summary: Right: There is no evidence of deep vein thrombosis in the lower extremity. No cystic structure found in the popliteal fossa. Left: There is no evidence of deep vein thrombosis in the lower extremity. No cystic structure found in the popliteal fossa.  *See table(s) above for measurements and observations. Electronically signed by Ruta Hinds MD on 04/22/2019 at 12:17:06 PM.    Final        Subjective: No new complaints.   Discharge Exam: Vitals:   04/23/19 1240 04/23/19 1456  BP: (!) 128/52 (!) 109/53  Pulse:  90  Resp: 17 18  Temp:  98.3 F (36.8 C)  SpO2:  99%  Vitals:   04/23/19 1230 04/23/19 1235 04/23/19 1240 04/23/19 1456  BP: 133/63 113/63 (!) 128/52 (!) 109/53  Pulse:    90  Resp: (!) 21 16 17 18   Temp:    98.3 F (36.8 C)  TempSrc:    Oral  SpO2: 100% 100%  99%  Weight:      Height:        General: Pt is alert, awake, not in acute distress Cardiovascular: RRR, S1/S2 +, no rubs, no gallops Respiratory: CTA bilaterally, no wheezing, no rhonchi Abdominal: Soft, distended bowel sounds good.  Extremities: leg edema present. , no cyanosis    The results of  significant diagnostics from this hospitalization (including imaging, microbiology, ancillary and laboratory) are listed below for reference.     Microbiology: Recent Results (from the past 240 hour(s))  SARS Coronavirus 2 [facility] order, Performed in [facility] hospital lab) Nasopharyngeal Nasopharyngeal Swab     Status: None   Collection Time: 04/19/19  8:57 PM   Specimen: Nasopharyngeal Swab  Result Value Ref Range Status   SARS Coronavirus 2 NEGATIVE NEGATIVE Final    Comment: (NOTE) If result is NEGATIVE SARS-CoV-2 target nucleic acids are NOT DETECTED. The SARS-CoV-2 RNA is generally detectable in upper and lower  respiratory specimens during the acute phase of infection. The lowest  concentration of SARS-CoV-2 viral copies this assay can detect is 250  copies / mL. A negative result does not preclude SARS-CoV-2 infection  and should not be used as the sole basis for treatment or other  patient management decisions.  A negative result may occur with  improper specimen collection / handling, submission of specimen other  than nasopharyngeal swab, presence of viral mutation(s) within the  areas targeted by this assay, and inadequate number of viral copies  (<250 copies / mL). A negative result must be combined with clinical  observations, patient history, and epidemiological information. If result is POSITIVE SARS-CoV-2 target nucleic acids are DETECTED. The SARS-CoV-2 RNA is generally detectable in upper and lower  respiratory specimens dur ing the acute phase of infection.  Positive  results are indicative of active infection with SARS-CoV-2.  Clinical  correlation with patient history and other diagnostic information is  necessary to determine patient infection status.  Positive results do  not rule out bacterial infection or co-infection with other viruses. If result is PRESUMPTIVE POSTIVE SARS-CoV-2 nucleic acids MAY BE PRESENT.   A presumptive positive result was  obtained on the submitted specimen  and confirmed on repeat testing.  While 2019 novel coronavirus  (SARS-CoV-2) nucleic acids may be present in the submitted sample  additional confirmatory testing may be necessary for epidemiological  and / or clinical management purposes  to differentiate between  SARS-CoV-2 and other Sarbecovirus currently known to infect humans.  If clinically indicated additional testing with an alternate test  methodology [phone]) is advised. The SARS-CoV-2 RNA is generally  detectable in upper and lower respiratory sp ecimens during the acute  phase of infection. The expected result is Negative. Fact Sheet for Patients:  StrictlyIdeas.no Fact Sheet for Healthcare Providers: BankingDealers.co.za This test is not yet approved or cleared by the [place] FDA and has been authorized for detection and/or diagnosis of SARS-CoV-2 by FDA under an Emergency Use Authorization (EUA).  This EUA will remain in effect (meaning this test can be used) for the duration of the COVID-19 declaration under Section 564(b)(1) of the Act, 21 U.S.C. section 360bbb-3(b)(1), unless the authorization is terminated or revoked sooner.  Performed at [facility], [place] [address], [place], [place] [address]      Labs: BNP (last 3 results) Recent Labs    04/19/19 2000  BNP 31.5   Basic Metabolic Panel: Recent Labs  Lab 04/19/19 2000 04/20/19 0515 04/20/19 1136 04/21/19 0743 04/23/19 0523  NA 134* 134* 137 138 140  K 5.2* 5.1 5.1 4.8 5.1  CL 106 107 109 111 113*  CO2 17* 21* 21* 22 21*  GLUCOSE 102* 75 92 88 95  BUN 36* 34* 31* 23 19  CREATININE 2.14* 1.91* 1.86* 1.32* 1.11*  CALCIUM 8.1* 7.6* 7.9* 7.9* 8.1*   Liver Function Tests: Recent Labs  Lab 04/19/19 2000 04/20/19 0515  AST 37 29  ALT 21 17  ALKPHOS 110 93  BILITOT 0.2* 0.3  PROT 5.1* 4.5*  ALBUMIN 2.4* 2.0*   No results for input(s):  LIPASE, AMYLASE in the last 168 hours. No results for input(s): AMMONIA in the last 168 hours. CBC: Recent Labs  Lab 04/19/19 2000 04/20/19 1136 04/20/19 1345 04/21/19 0743 04/22/19 0216 04/22/19 1811 04/23/19 0523  WBC 3.5* 5.1  --  4.9 5.0  --  6.0  NEUTROABS 2.5  --   --   --   --   --  4.2  HGB 11.7* 7.3* 7.2* 7.1* 6.3* 8.1* 7.7*  HCT 37.5 23.1* 22.8* 22.9* 20.6* 25.3* 24.7*  MCV 94.9 97.1  --  97.9 98.6  --  96.9  PLT 282 312  --  294 273  --  264   Cardiac Enzymes: No results for input(s): CKTOTAL, CKMB, CKMBINDEX, TROPONINI in the last 168 hours. BNP: Invalid input(s): POCBNP CBG: Recent Labs  Lab 04/22/19 1155 04/22/19 1642 04/22/19 2138 04/23/19 0730 04/23/19 1336  GLUCAP 86 99 82 83 81   D-Dimer No results for input(s): DDIMER in the last 72 hours. Hgb A1c No results for input(s): HGBA1C in the last 72 hours. Lipid Profile No results for input(s): CHOL, HDL, LDLCALC, TRIG, CHOLHDL, LDLDIRECT in the last 72 hours. Thyroid function studies No results for input(s): TSH, T4TOTAL, T3FREE, THYROIDAB in the last 72 hours.  Invalid input(s): FREET3 Anemia work up No results for input(s): VITAMINB12, FOLATE, FERRITIN, TIBC, IRON, RETICCTPCT in the last 72 hours. Urinalysis No results found for: COLORURINE, APPEARANCEUR, LABSPEC, [place], GLUCOSEU, [place], BILIRUBINUR, KETONESUR, PROTEINUR, UROBILINOGEN, NITRITE, LEUKOCYTESUR Sepsis Labs Invalid input(s): PROCALCITONIN,  WBC,  [place] Microbiology Recent Results (from the past 240 hour(s))  SARS Coronavirus 2 [facility] order, Performed in [facility] hospital lab) Nasopharyngeal Nasopharyngeal Swab     Status: None   Collection Time: 04/19/19  8:57 PM   Specimen: Nasopharyngeal Swab  Result Value Ref Range Status   SARS Coronavirus 2 NEGATIVE NEGATIVE Final    Comment: (NOTE) If result is NEGATIVE SARS-CoV-2 target nucleic acids are NOT DETECTED. The SARS-CoV-2 RNA is generally detectable in upper and  lower  respiratory specimens during the acute phase of infection. The lowest  concentration of SARS-CoV-2 viral copies this assay can detect is 250  copies / mL. A negative result does not preclude SARS-CoV-2 infection  and should not be used as the sole basis for treatment or other  patient management decisions.  A negative result may occur with  improper specimen collection / handling, submission of specimen other  than nasopharyngeal swab, presence of viral mutation(s) within the  areas targeted by this assay, and inadequate number of viral copies  (<250 copies / mL). A negative result must be combined with  clinical  observations, patient history, and epidemiological information. If result is POSITIVE SARS-CoV-2 target nucleic acids are DETECTED. The SARS-CoV-2 RNA is generally detectable in upper and lower  respiratory specimens dur ing the acute phase of infection.  Positive  results are indicative of active infection with SARS-CoV-2.  Clinical  correlation with patient history and other diagnostic information is  necessary to determine patient infection status.  Positive results do  not rule out bacterial infection or co-infection with other viruses. If result is PRESUMPTIVE POSTIVE SARS-CoV-2 nucleic acids MAY BE PRESENT.   A presumptive positive result was obtained on the submitted specimen  and confirmed on repeat testing.  While 2019 novel coronavirus  (SARS-CoV-2) nucleic acids may be present in the submitted sample  additional confirmatory testing may be necessary for epidemiological  and / or clinical management purposes  to differentiate between  SARS-CoV-2 and other Sarbecovirus currently known to infect humans.  If clinically indicated additional testing with an alternate test  methodology [phone]) is advised. The SARS-CoV-2 RNA is generally  detectable in upper and lower respiratory sp ecimens during the acute  phase of infection. The expected result is  Negative. Fact Sheet for Patients:  StrictlyIdeas.no Fact Sheet for Healthcare Providers: BankingDealers.co.za This test is not yet approved or cleared by the [place] FDA and has been authorized for detection and/or diagnosis of SARS-CoV-2 by FDA under an Emergency Use Authorization (EUA).  This EUA will remain in effect (meaning this test can be used) for the duration of the COVID-19 declaration under Section 564(b)(1) of the Act, 21 U.S.C. section 360bbb-3(b)(1), unless the authorization is terminated or revoked sooner. Performed at [facility], [place] [address] [place], [place] [address]      Time coordinating discharge: 32 minutes  SIGNED:   Hosie Poisson, MD  [facility] 04/23/2019, 8:19 PM Pager   If 7PM-7AM, please contact night-coverage [email or web address] Password [email or web address]

## 2019-05-07 DIAGNOSIS — I10 Essential (primary) hypertension: Secondary | ICD-10-CM | POA: Diagnosis not present

## 2019-05-07 DIAGNOSIS — E1165 Type 2 diabetes mellitus with hyperglycemia: Secondary | ICD-10-CM | POA: Diagnosis not present

## 2019-05-22 DIAGNOSIS — R69 Illness, unspecified: Secondary | ICD-10-CM | POA: Diagnosis not present

## 2019-05-23 DIAGNOSIS — C7962 Secondary malignant neoplasm of left ovary: Secondary | ICD-10-CM | POA: Diagnosis not present

## 2019-05-23 DIAGNOSIS — E440 Moderate protein-calorie malnutrition: Secondary | ICD-10-CM | POA: Diagnosis not present

## 2019-05-23 DIAGNOSIS — I2699 Other pulmonary embolism without acute cor pulmonale: Secondary | ICD-10-CM | POA: Diagnosis not present

## 2019-06-19 DIAGNOSIS — E1169 Type 2 diabetes mellitus with other specified complication: Secondary | ICD-10-CM | POA: Diagnosis not present

## 2019-07-08 DEATH — deceased

## 2020-02-18 IMAGING — US IR FLUORO GUIDE CV LINE*L*
1 series · 1 of 1 positions shown · non-contrast
Comparison: none

CLINICAL DATA: Abdominal cancer

[Series 1: ir fluoro/shunt/fist · 1 of 1 slices shown]
[im 1/1]
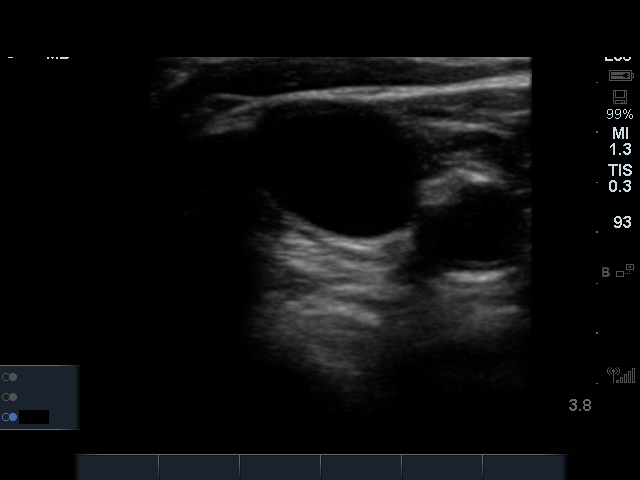

[1 of 1 positions shown; findings below may reference images not displayed]

EXAM:
TUNNEL POWER PORT PLACEMENT WITH SUBCUTANEOUS POCKET UTILIZING
ULTRASOUND & FLOUROSCOPY

FLUOROSCOPY TIME:  36 seconds.  Six mGy.

MEDICATIONS AND MEDICAL HISTORY:
Versed 4 mg, Fentanyl 100 mcg.

Additional Medications: 2 g Ancef. Antibiotics were given within 2
hours of the procedure.

ANESTHESIA/SEDATION:
Moderate sedation time: 28 minutes. Nursing monitored the the
patient during the procedure.

PROCEDURE:
After written informed consent was obtained, patient was placed in
the supine position on angiographic table. The right neck and chest
was prepped and draped in a sterile fashion. Lidocaine was utilized
for local anesthesia. The right jugular vein was noted to be patent
initially with ultrasound. Under sonographic guidance, a
micropuncture needle was inserted into the right IJ vein (Ultrasound
and fluoroscopic image documentation was performed). The needle was
removed over an 018 wire which was exchanged for a Amplatz. This was
advanced into the IVC. An 8-French dilator was advanced over the
Amplatz.

A small incision was made in the right upper chest over the anterior
right second rib. Utilizing blunt dissection, a subcutaneous pocket
was created in the caudal direction. The pocket was irrigated with a
copious amount of sterile normal saline. The port catheter was
tunneled from the chest incision, and out the neck incision. The
reservoir was inserted into the subcutaneous pocket and secured with
two 3-0 Ethilon stitches. A peel-away sheath was advanced over the
Amplatz wire. The port catheter was cut to measure length and
inserted through the peel-away sheath. The peel-away sheath was
removed. The chest incision was closed with 3-0 Vicryl interrupted
stitches for the subcutaneous tissue and a running of 4-0 Vicryl
subcuticular stitch for the skin. The neck incision was closed with
a 4-0 Vicryl subcuticular stitch. Derma-bond was applied to both
surgical incisions. The port reservoir was flushed and instilled
with heparinized saline. No complications.
FINDINGS: A right IJ vein Port-A-Cath is in place with its tip at the
cavoatrial junction.

COMPLICATIONS:
None
IMPRESSION: Successful 8 French right internal jugular vein power port placement
with its tip at the SVC/RA junction.

## 2020-02-18 IMAGING — US IR US GUIDANCE
1 series · 2 of 2 positions shown · non-contrast
Comparison: none

INDICATION: Peritoneal carcinomatosis is suspected.  Umbilical lesion.

[Series 1: ir us guidance · 2 of 2 slices shown]
[im 1/2]
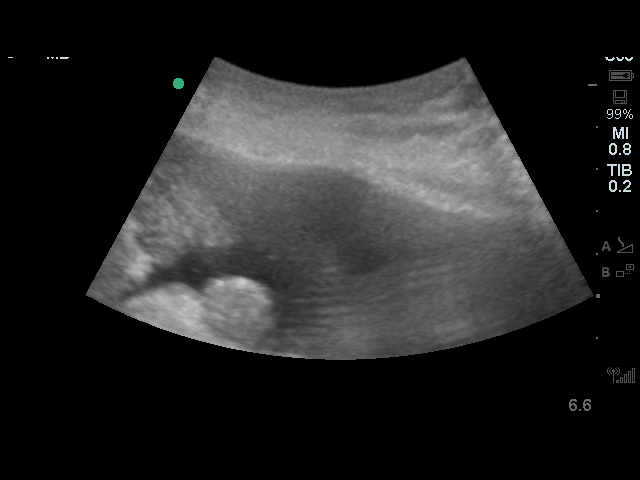
[im 2/2]
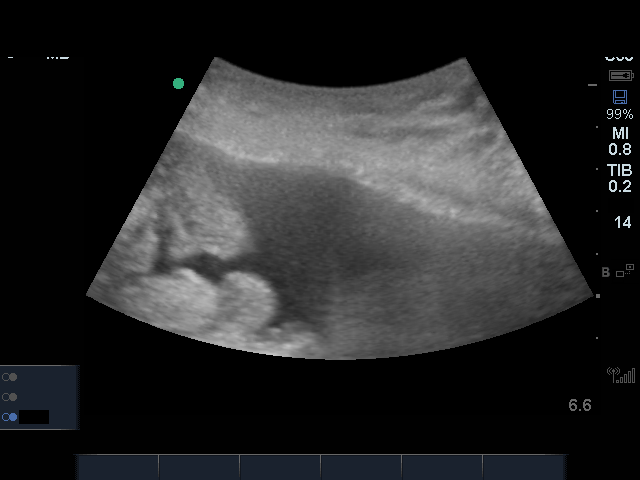

[2 of 2 positions shown; findings below may reference images not displayed]

EXAM:
ULTRASOUND-GUIDED CORE BIOPSY OF AN UMBILICAL MASS

MEDICATIONS:
None.

ANESTHESIA/SEDATION:
Moderate (conscious) sedation was employed during this procedure. A
total of Versed 1 mg and Fentanyl 0 mcg was administered
intravenously.

Moderate Sedation Time: Not applicable minutes. The patient's level
of consciousness and vital signs were monitored continuously by
radiology nursing throughout the procedure under my direct
supervision.

FLUOROSCOPY TIME:  None

COMPLICATIONS:
None immediate.

PROCEDURE:
Informed written consent was obtained from the patient after a
thorough discussion of the procedural risks, benefits and
alternatives. All questions were addressed. Maximal Sterile Barrier
Technique was utilized including caps, mask, sterile gowns, sterile
gloves, sterile drape, hand hygiene and skin antiseptic. A timeout
was performed prior to the initiation of the procedure.

The abdomen was prepped and draped in a sterile fashion. 1%
lidocaine was utilized for local anesthesia. Under sonographic
guidance, 2 18 gauge core biopsies of the omental mass were
obtained.
FINDINGS: Images document needle placement in the umbilical mass. Post biopsy
sonographic imaging demonstrates no evidence of hemorrhage.
IMPRESSION: Successful core biopsy of an umbilical mass.

## 2020-04-24 IMAGING — CT CT CHEST WITH CONTRAST
3 of 5 series · 14 of 36 positions shown, 17 images · IV contrast (omnipaque)
Comparison: Chest CT 09/26/2018 and AP CT on 08/25/2018

CLINICAL DATA: Follow-up metastatic ovarian carcinoma. Ongoing
chemotherapy. Personal history of lung carcinoma.

EXAM:
CT CHEST, ABDOMEN, AND PELVIS WITH CONTRAST
TECHNIQUE: Multidetector CT imaging of the chest, abdomen and pelvis was
performed following the standard protocol during bolus
administration of intravenous contrast.
CONTRAST:  100mL OMNIPAQUE IOHEXOL 300 MG/ML  SOLN

[Series 2: cap with · axial · 0.73mm/px · z∈[+1200,+1675]mm · 9 of 119 slices shown, 12 images]
[im 12/119  mediastinal]
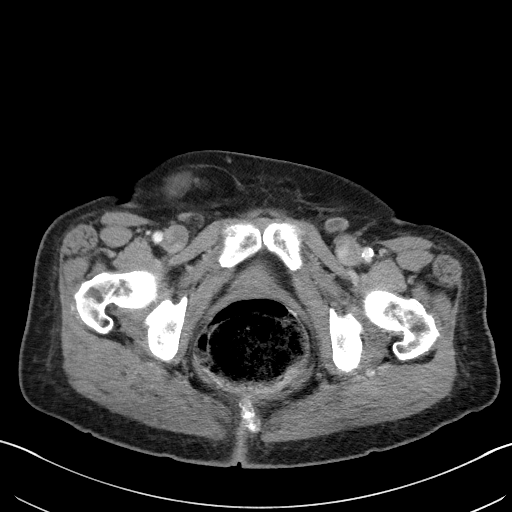
[im 12/119  lung]
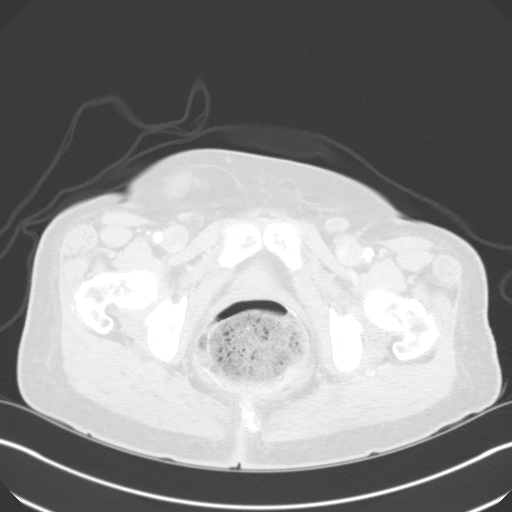
[im 24/119  lung]
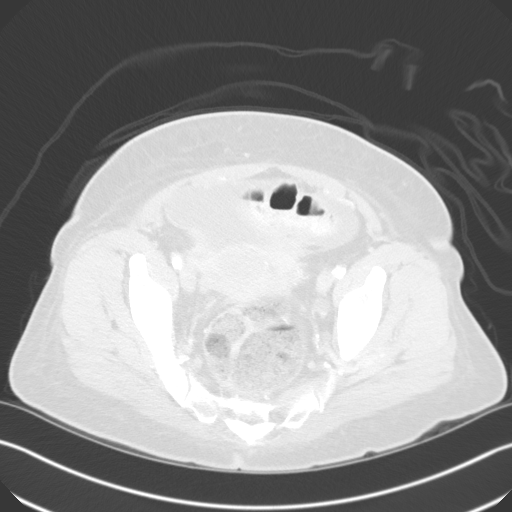
[im 36/119  lung]
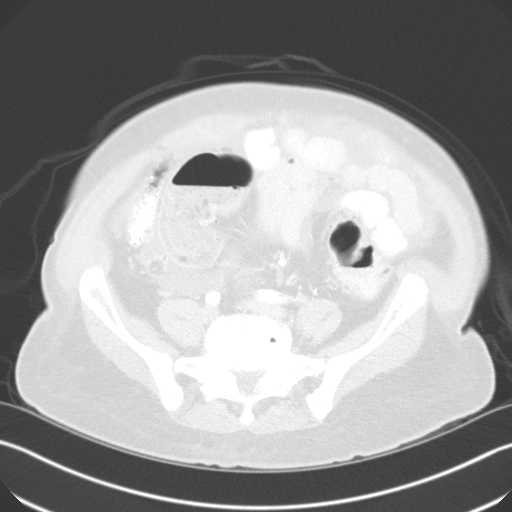
[im 48/119  lung]
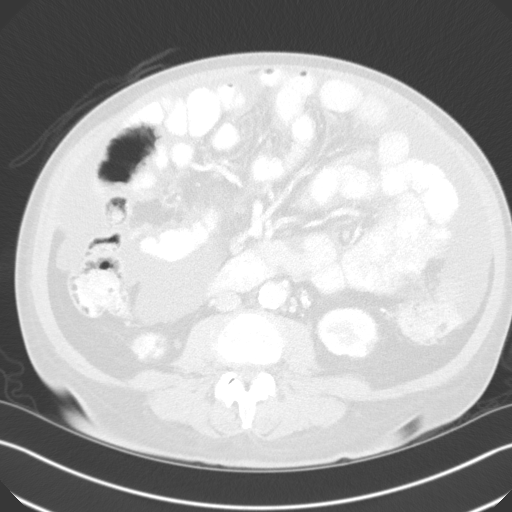
[im 60/119  mediastinal]
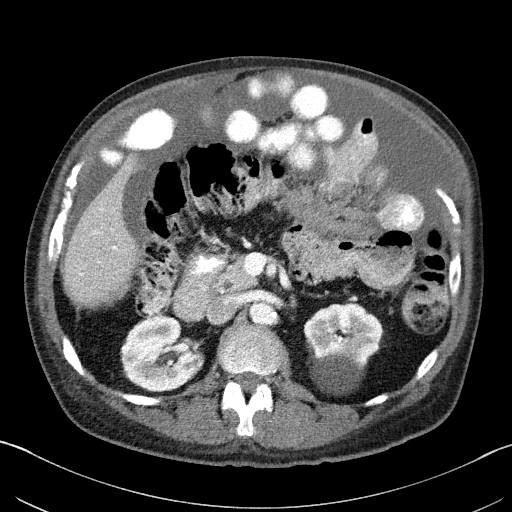
[im 60/119  lung]
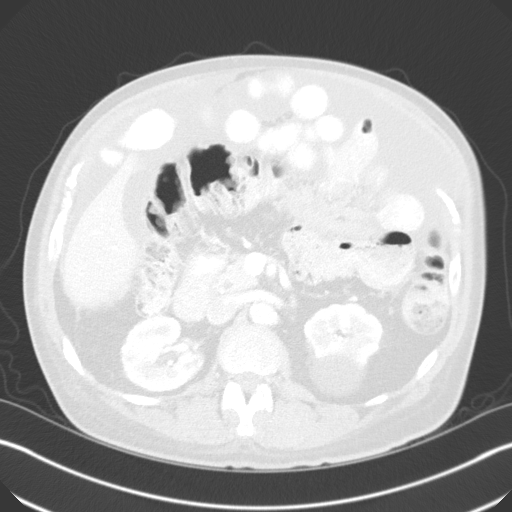
[im 71/119  lung]
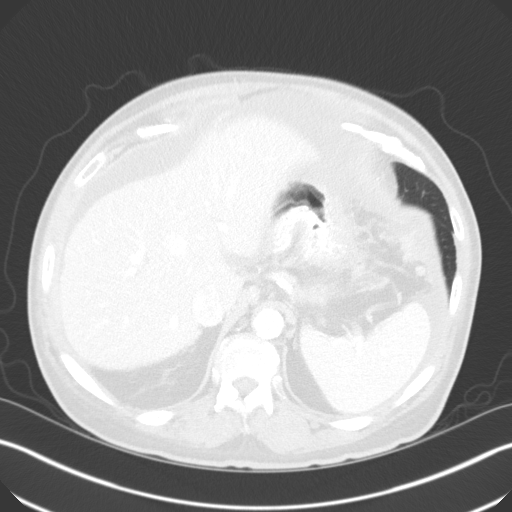
[im 83/119  lung]
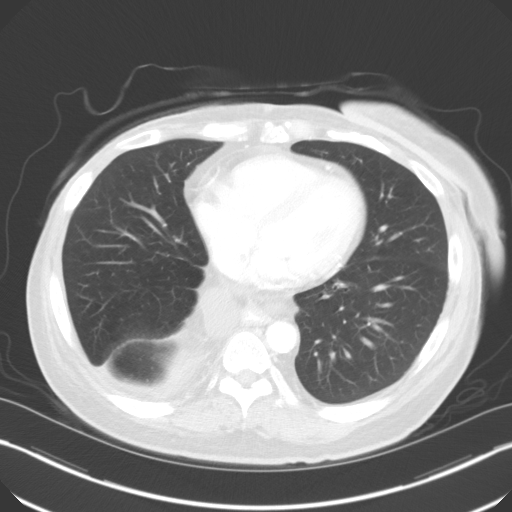
[im 95/119  lung]
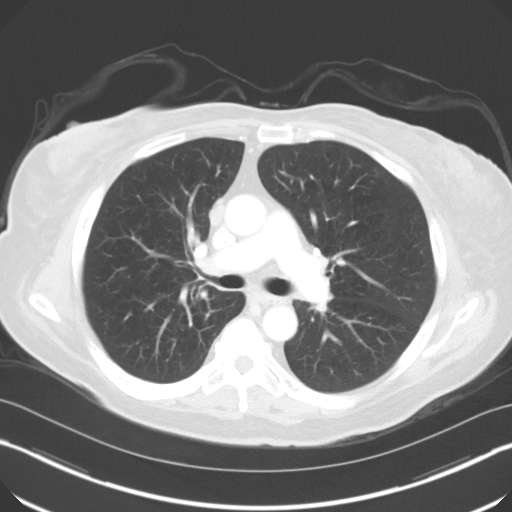
[im 107/119  mediastinal]
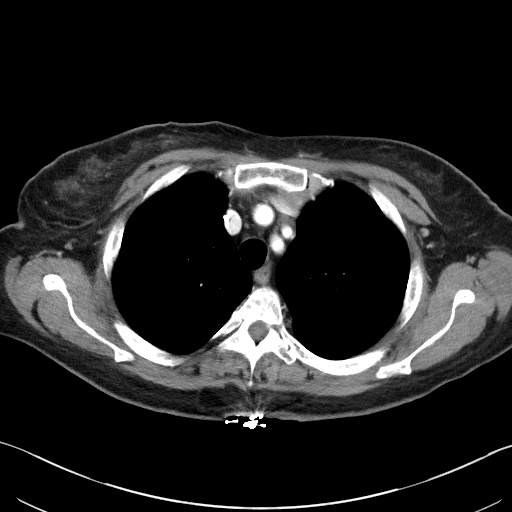
[im 107/119  lung]
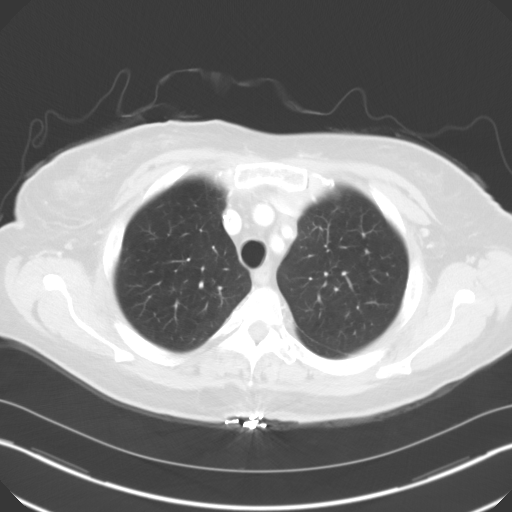

[Series 4: lung · axial · 0.73mm/px · z∈[+1465,+1509]mm · 2 of 147 slices shown]
[im 12/147  lung]
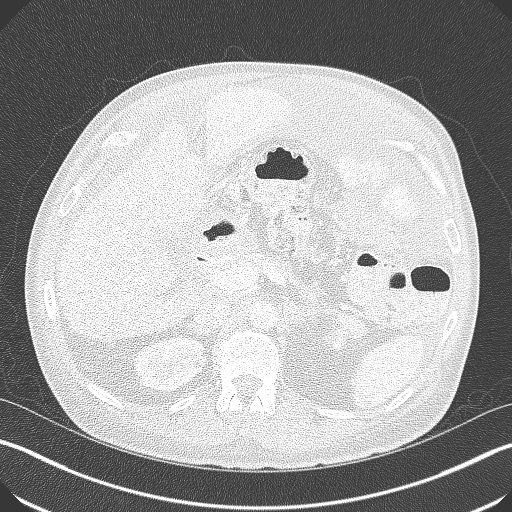
[im 34/147  lung]
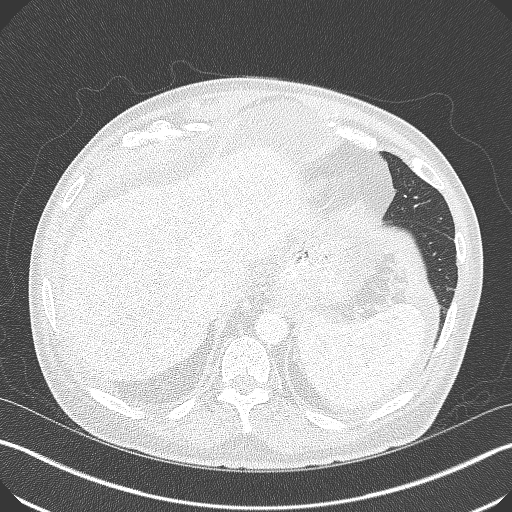

[Series 5: coronals · coronal · 0.82mm/px · 3 of 160 slices shown]
[im 32/160  lung]
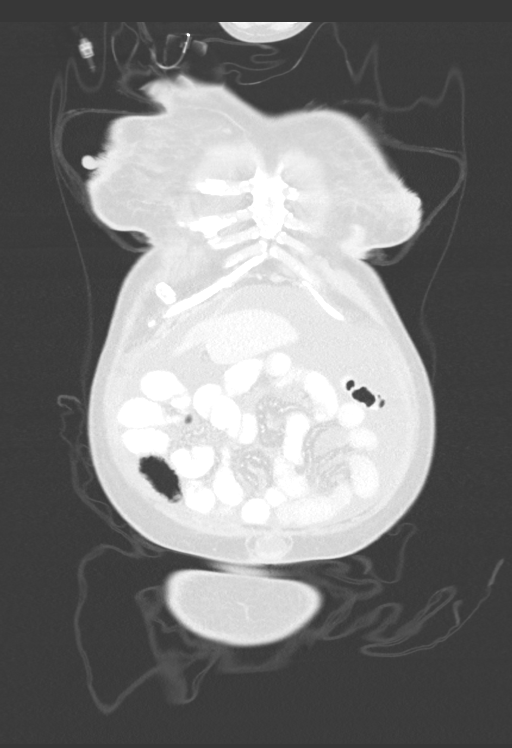
[im 64/160  lung]
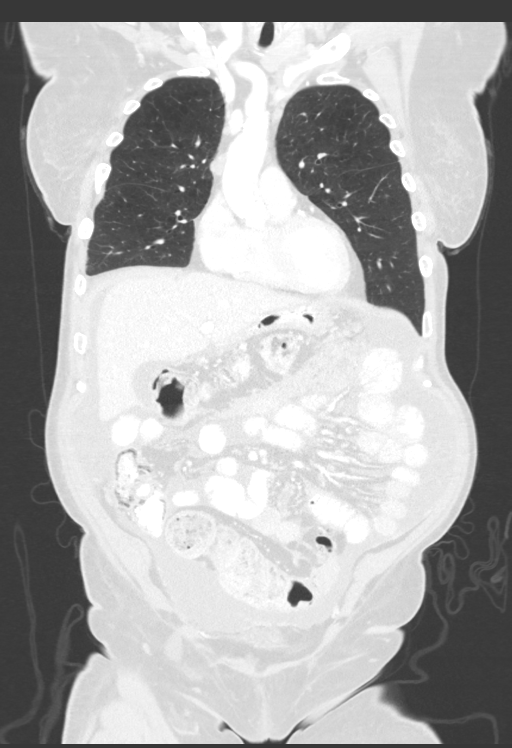
[im 96/160  lung]
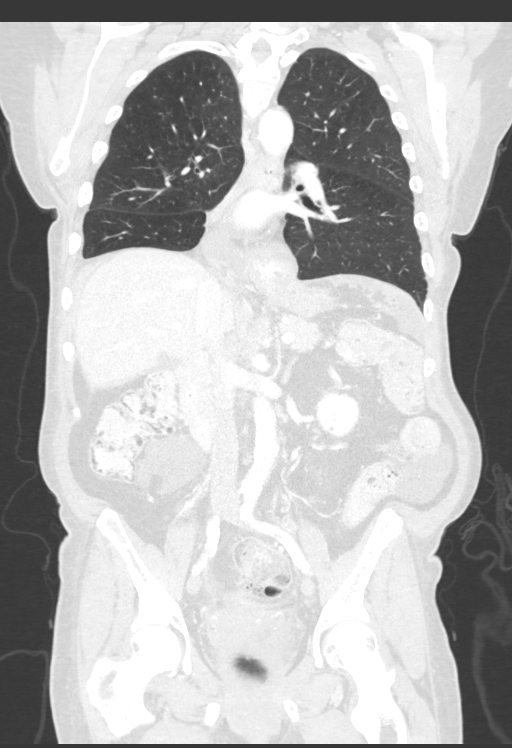

[14 of 36 positions shown; findings below may reference images not displayed]

FINDINGS: CT CHEST FINDINGS

Cardiovascular: No acute findings.

Mediastinum/Lymph Nodes: Stable mild mediastinal lymphadenopathy,
with largest lymph node in the prevascular space measuring 1.4 cm.
Mild lymphadenopathy right cardiophrenic angle is also stable. No
new or increased lymphadenopathy identified.

Lungs/Pleura: Stable right pleural thickening, without evidence
effusion. No suspicious pulmonary nodules masses identified. No
evidence of pulmonary infiltrate. Mild centrilobular emphysema again
noted.

Musculoskeletal:  No suspicious bone lesions identified.

CT ABDOMEN AND PELVIS FINDINGS

Hepatobiliary: Tiny sub-cm lesions in right hepatic lobe are stable
but remain too small to characterize. These likely represent tiny
cysts. No definite liver masses identified. Gallbladder is
unremarkable.

Pancreas:  No mass or inflammatory changes.

Spleen:  Within normal limits in size and appearance.

Adrenals/Urinary tract: Stable bilateral renal cysts. No masses or
hydronephrosis.

Stomach/Bowel: Small to moderate hiatal hernia no evidence of
obstruction, inflammatory process, or abnormal fluid collections.
Diverticulosis is seen mainly involving the sigmoid colon, however
there is no evidence of diverticulitis. Large amount of stool noted
in the rectosigmoid colon.

Vascular/Lymphatic: No pathologically enlarged lymph nodes
identified. No abdominal aortic aneurysm. Aortic atherosclerosis.

Reproductive: Soft tissue mass in the left adnexa measures 4.4 x
cm on image 95/2, without significant change compared to prior
study. Moderate ascites shows mild increase since previous study,
however there is been significant decrease in omental soft tissue
caking and peritoneal thickening in the pelvis, consistent with
decreased peritoneal carcinomatosis.

Other: Stable small paraumbilical and right inguinal hernias
containing ascites.

Musculoskeletal:  No suspicious bone lesions identified.
IMPRESSION: 1. Significant decrease in peritoneal carcinomatosis since previous
study. Ascites has mildly increased.
2. Stable left adnexal mass.
3. Stable mild mediastinal lymphadenopathy.
4. No new or progressive metastatic disease identified.
5. Small to moderate hiatal hernia. Small paraumbilical and right
inguinal hernias.
6. Colonic diverticulosis, without radiographic evidence of
diverticulitis.

## 2020-08-06 IMAGING — US PARACENTESIS WITH ULTRASOUND GUIDANCE
1 series · 12 of 12 positions shown · non-contrast
Comparison: none

INDICATION: Patient with history of ovarian cancer, recurrent asictes. Request
made for therapeutic paracentesis.

[Series 1: paracentesis with ultrasound guidance · 12 of 12 slices shown]
[im 1/12]
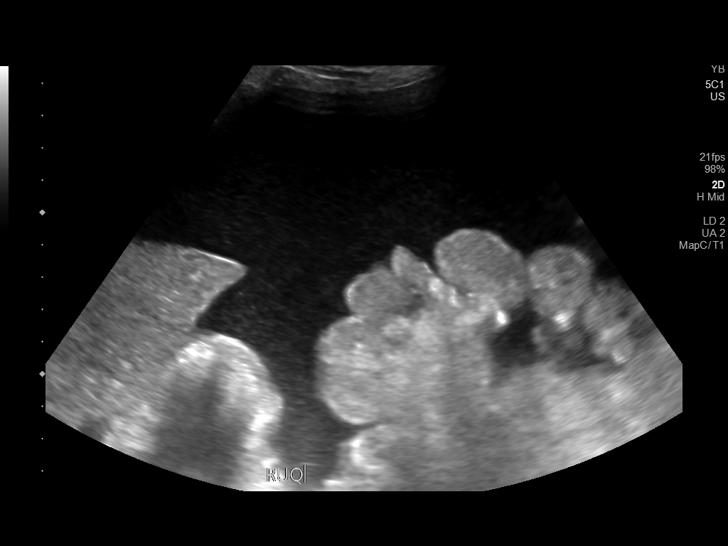
[im 2/12]
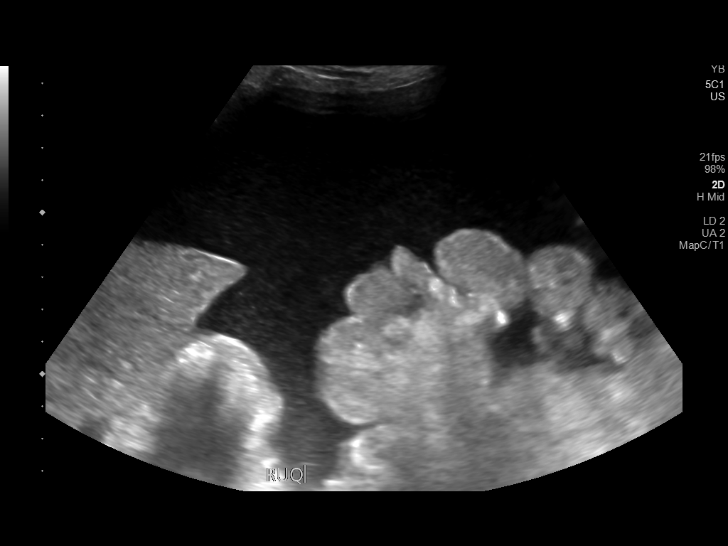
[im 3/12]
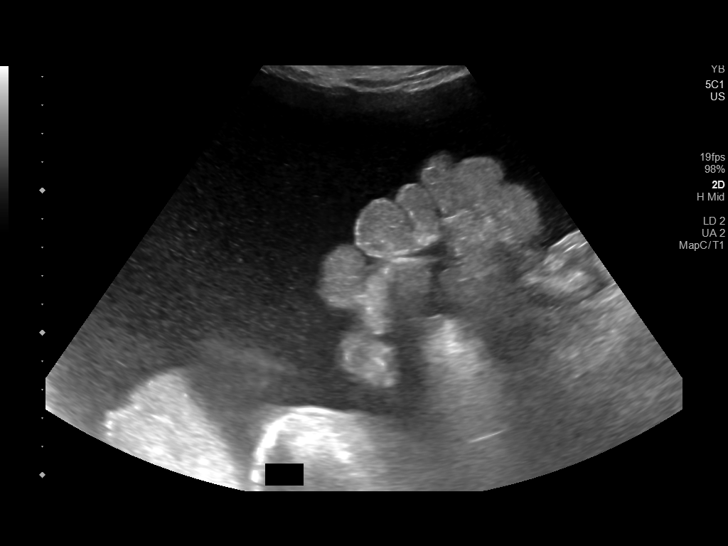
[im 4/12]
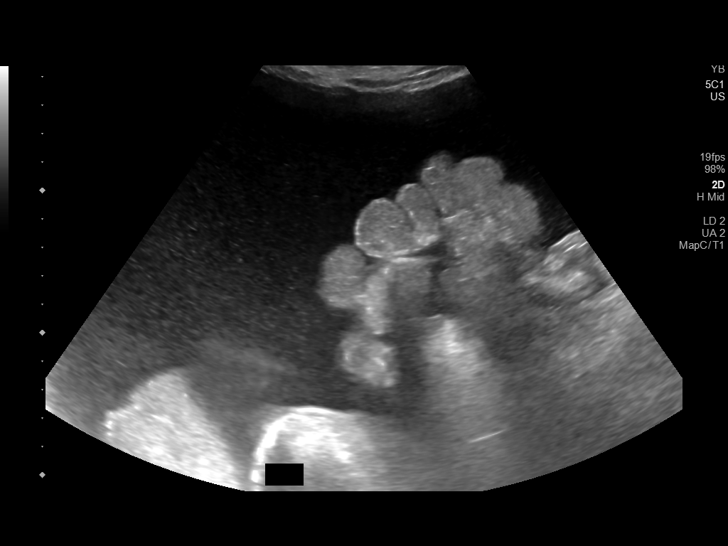
[im 5/12]
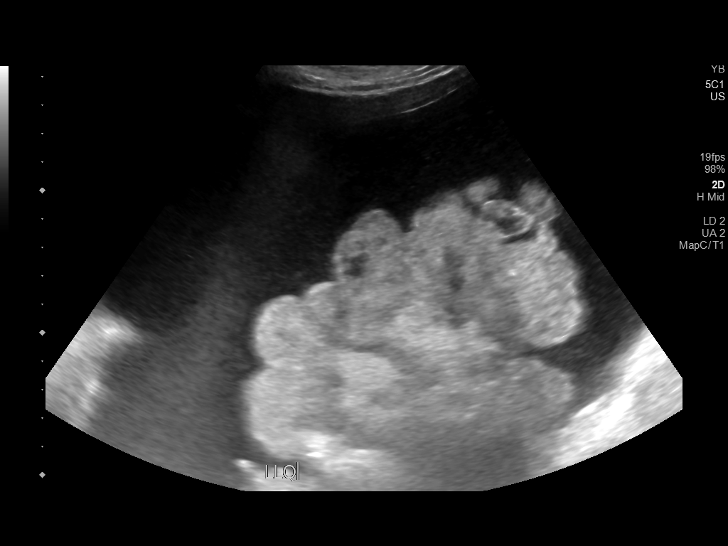
[im 6/12]
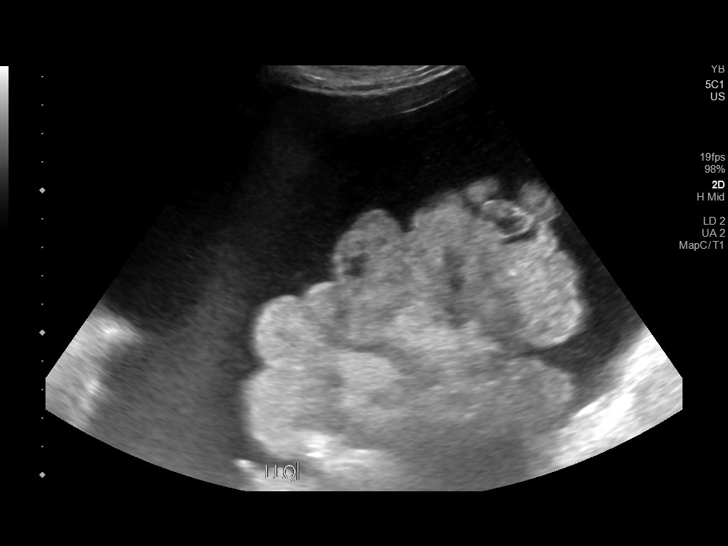
[im 7/12]
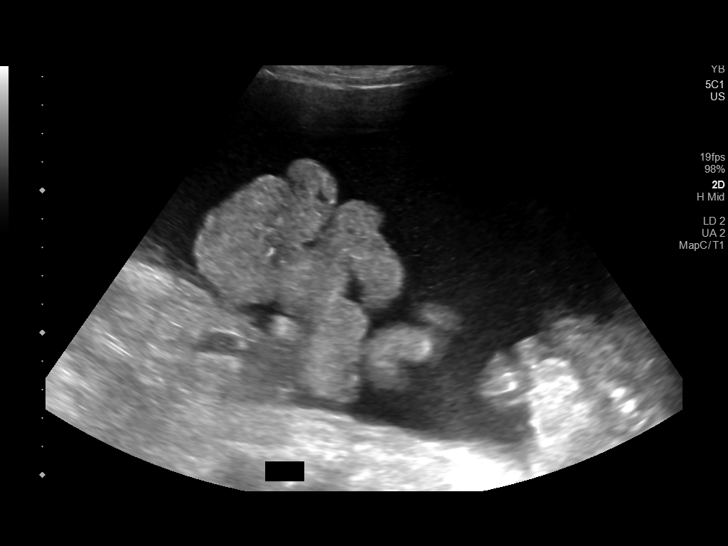
[im 8/12]
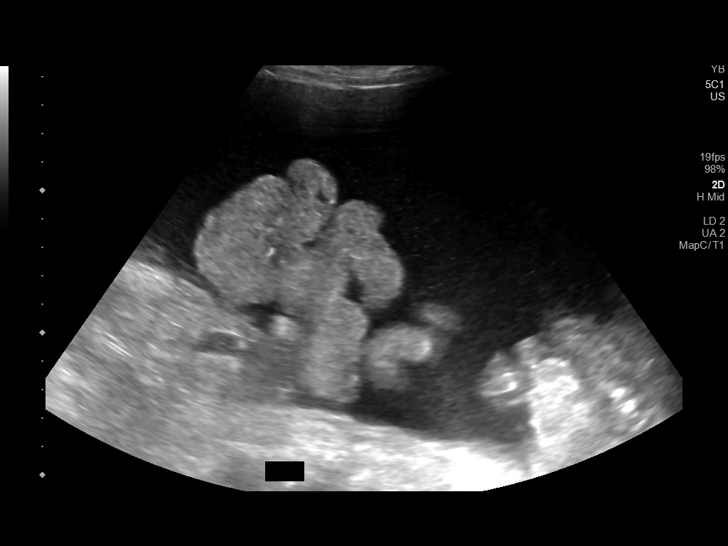
[im 9/12]
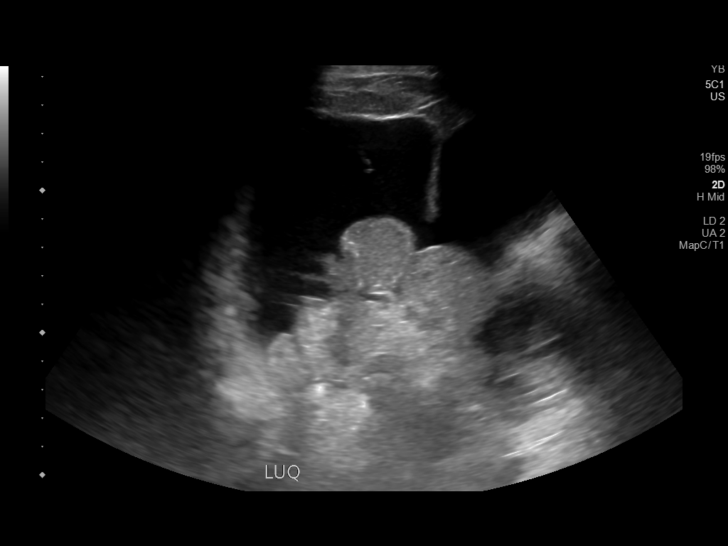
[im 10/12]
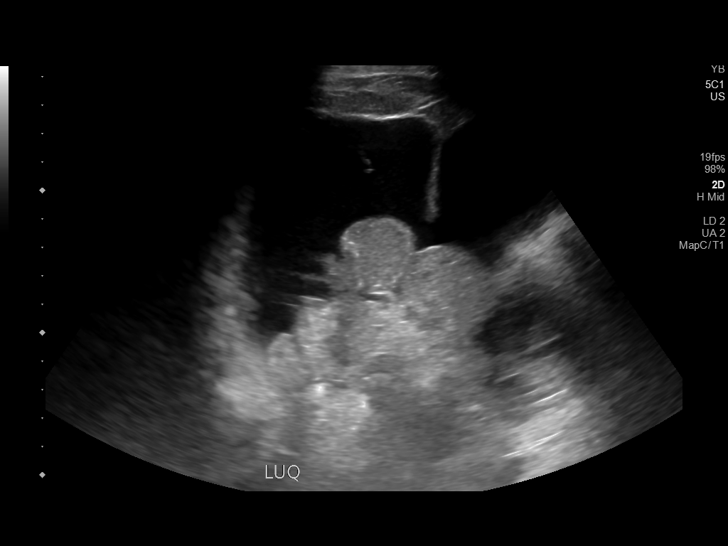
[im 11/12]
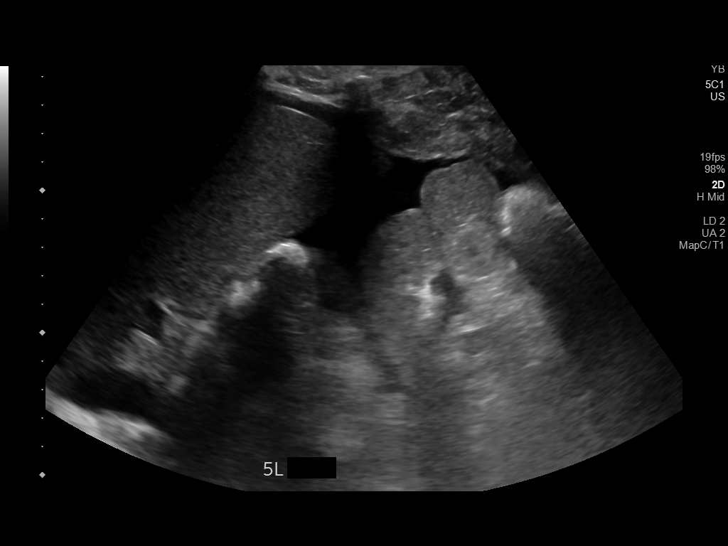
[im 12/12]
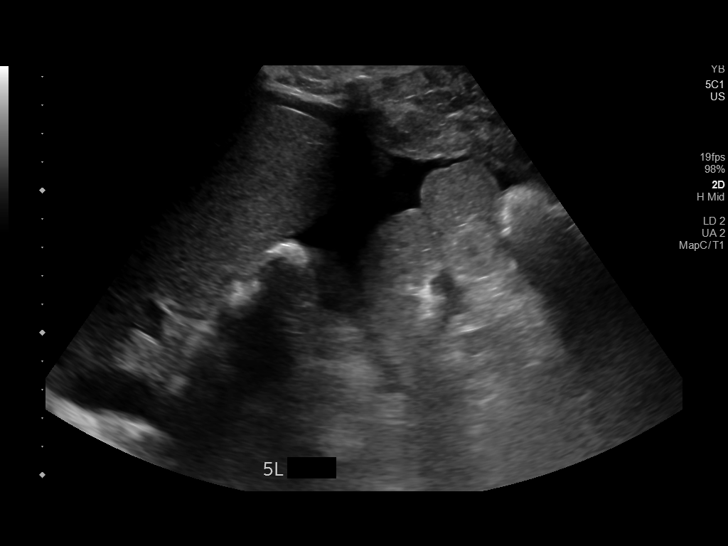

[12 of 12 positions shown; findings below may reference images not displayed]

EXAM:
ULTRASOUND GUIDED THERAPEUTIC PARACENTESIS

MEDICATIONS:
10 mL 1% lidocaine

COMPLICATIONS:
None immediate.

PROCEDURE:
Informed written consent was obtained from the patient after a
discussion of the risks, benefits and alternatives to treatment. A
timeout was performed prior to the initiation of the procedure.

Initial ultrasound scanning demonstrates a large amount of ascites
within the right lower abdominal quadrant. The right lower abdomen
was prepped and draped in the usual sterile fashion. 1% lidocaine
was used for local anesthesia.

Following this, a 19 gauge, 7-cm, Yueh catheter was introduced. An
ultrasound image was saved for documentation purposes. The
paracentesis was performed. The catheter was removed and a dressing
was applied. The patient tolerated the procedure well without
immediate post procedural complication.
FINDINGS: A total of approximately 5.0 liters of pink fluid was removed.
IMPRESSION: Successful ultrasound-guided paracentesis yielding 5.0 liters of
peritoneal fluid.

## 2021-03-06 ENCOUNTER — Encounter: Payer: Self-pay | Admitting: Hematology and Oncology
